# Patient Record
Sex: Female | Born: 1965
Health system: Southern US, Community
[De-identification: ages and names within clinical notes are randomized; demographics above are authoritative.]

## PROBLEM LIST (undated history)

## (undated) DIAGNOSIS — T7840XA Allergy, unspecified, initial encounter: Secondary | ICD-10-CM

## (undated) DIAGNOSIS — K219 Gastro-esophageal reflux disease without esophagitis: Secondary | ICD-10-CM

## (undated) DIAGNOSIS — F419 Anxiety disorder, unspecified: Secondary | ICD-10-CM

## (undated) DIAGNOSIS — G43909 Migraine, unspecified, not intractable, without status migrainosus: Secondary | ICD-10-CM

## (undated) DIAGNOSIS — E785 Hyperlipidemia, unspecified: Secondary | ICD-10-CM

## (undated) HISTORY — DX: Allergy, unspecified, initial encounter: T78.40XA

## (undated) HISTORY — PX: CHOLECYSTECTOMY: SHX55

## (undated) HISTORY — DX: Hyperlipidemia, unspecified: E78.5

---

## 2006-08-01 LAB — CONVERTED CEMR LAB
Pap Smear: NORMAL
Pap Smear: NORMAL

## 2008-10-30 ENCOUNTER — Ambulatory Visit: Payer: Self-pay | Admitting: Family Medicine

## 2008-10-30 DIAGNOSIS — L408 Other psoriasis: Secondary | ICD-10-CM | POA: Insufficient documentation

## 2008-10-30 DIAGNOSIS — J309 Allergic rhinitis, unspecified: Secondary | ICD-10-CM | POA: Insufficient documentation

## 2008-10-30 DIAGNOSIS — R87619 Unspecified abnormal cytological findings in specimens from cervix uteri: Secondary | ICD-10-CM | POA: Insufficient documentation

## 2008-10-30 DIAGNOSIS — N3946 Mixed incontinence: Secondary | ICD-10-CM | POA: Insufficient documentation

## 2008-10-30 LAB — CONVERTED CEMR LAB
Bilirubin Urine: NEGATIVE
Glucose, Urine, Semiquant: NEGATIVE
Ketones, urine, test strip: NEGATIVE
Specific Gravity, Urine: 1.01
Urobilinogen, UA: 0.2
pH: 6.5

## 2009-01-22 ENCOUNTER — Ambulatory Visit: Payer: Self-pay | Admitting: Family Medicine

## 2009-01-22 LAB — CONVERTED CEMR LAB: LDL Cholesterol: 149 mg/dL

## 2009-01-27 LAB — CONVERTED CEMR LAB
ALT: 19 units/L (ref 0–35)
AST: 18 units/L (ref 0–37)
Alkaline Phosphatase: 60 units/L (ref 39–117)
Bilirubin, Direct: 0.1 mg/dL (ref 0.0–0.3)
CO2: 30 meq/L (ref 19–32)
Chloride: 106 meq/L (ref 96–112)
Creatinine, Ser: 0.8 mg/dL (ref 0.4–1.2)
Potassium: 4.2 meq/L (ref 3.5–5.1)
Sodium: 141 meq/L (ref 135–145)
Total Bilirubin: 0.9 mg/dL (ref 0.3–1.2)
Total CHOL/HDL Ratio: 6
Total Protein: 7.2 g/dL (ref 6.0–8.3)
VLDL: 25.6 mg/dL (ref 0.0–40.0)

## 2009-02-05 ENCOUNTER — Other Ambulatory Visit: Admission: RE | Admit: 2009-02-05 | Discharge: 2009-02-05 | Payer: Self-pay | Admitting: Family Medicine

## 2009-02-05 ENCOUNTER — Encounter: Payer: Self-pay | Admitting: Family Medicine

## 2009-02-05 ENCOUNTER — Ambulatory Visit: Payer: Self-pay | Admitting: Family Medicine

## 2009-02-05 DIAGNOSIS — R928 Other abnormal and inconclusive findings on diagnostic imaging of breast: Secondary | ICD-10-CM | POA: Insufficient documentation

## 2009-02-05 DIAGNOSIS — E7800 Pure hypercholesterolemia, unspecified: Secondary | ICD-10-CM | POA: Insufficient documentation

## 2009-02-08 LAB — CONVERTED CEMR LAB
Basophils Relative: 1 % (ref 0–1)
Eosinophils Relative: 2 % (ref 0–5)
HCT: 41.4 % (ref 36.0–46.0)
Hemoglobin: 13.8 g/dL (ref 12.0–15.0)
MCHC: 33.3 g/dL (ref 30.0–36.0)
Monocytes Absolute: 0.4 10*3/uL (ref 0.1–1.0)
Monocytes Relative: 6 % (ref 3–12)
Neutro Abs: 4.6 10*3/uL (ref 1.7–7.7)
RBC: 4.85 M/uL (ref 3.87–5.11)
RDW: 13.2 % (ref 11.5–15.5)
TSH: 1.453 microintl units/mL (ref 0.350–4.500)

## 2009-02-10 ENCOUNTER — Encounter (INDEPENDENT_AMBULATORY_CARE_PROVIDER_SITE_OTHER): Payer: Self-pay | Admitting: *Deleted

## 2009-02-16 ENCOUNTER — Encounter: Payer: Self-pay | Admitting: Family Medicine

## 2009-02-22 ENCOUNTER — Encounter: Payer: Self-pay | Admitting: Family Medicine

## 2009-02-23 ENCOUNTER — Encounter: Payer: Self-pay | Admitting: Family Medicine

## 2009-03-02 ENCOUNTER — Encounter (INDEPENDENT_AMBULATORY_CARE_PROVIDER_SITE_OTHER): Payer: Self-pay | Admitting: *Deleted

## 2009-03-03 ENCOUNTER — Encounter (INDEPENDENT_AMBULATORY_CARE_PROVIDER_SITE_OTHER): Payer: Self-pay | Admitting: *Deleted

## 2009-05-12 ENCOUNTER — Ambulatory Visit: Payer: Self-pay | Admitting: Family Medicine

## 2009-05-14 LAB — CONVERTED CEMR LAB
Cholesterol: 238 mg/dL — ABNORMAL HIGH (ref 0–200)
Direct LDL: 185.4 mg/dL
HDL: 45.5 mg/dL (ref >39.00)
LDL Cholesterol: 180 mg/dL — ABNORMAL HIGH (ref 0–99)
VLDL: 12.6 mg/dL (ref 0.0–40.0)

## 2009-08-06 ENCOUNTER — Encounter: Payer: Self-pay | Admitting: Family Medicine

## 2010-04-29 ENCOUNTER — Encounter: Payer: Self-pay | Admitting: Family Medicine

## 2010-05-04 LAB — HM MAMMOGRAPHY: HM Mammogram: NORMAL

## 2010-06-07 ENCOUNTER — Telehealth (INDEPENDENT_AMBULATORY_CARE_PROVIDER_SITE_OTHER): Payer: Self-pay | Admitting: *Deleted

## 2010-06-09 ENCOUNTER — Ambulatory Visit: Payer: Self-pay | Admitting: Family Medicine

## 2010-06-09 LAB — CONVERTED CEMR LAB: LDL Cholesterol: 1742 mg/dL

## 2010-06-14 LAB — CONVERTED CEMR LAB
AST: 17 units/L (ref 0–37)
Albumin: 3.8 g/dL (ref 3.5–5.2)
Alkaline Phosphatase: 60 units/L (ref 39–117)
Chloride: 103 meq/L (ref 96–112)
GFR calc non Af Amer: 71.85 mL/min (ref >60.00)
Glucose, Bld: 92 mg/dL (ref 70–99)
Potassium: 4.2 meq/L (ref 3.5–5.1)
Sodium: 138 meq/L (ref 135–145)
Total CHOL/HDL Ratio: 5
VLDL: 18.2 mg/dL (ref 0.0–40.0)

## 2010-07-01 ENCOUNTER — Other Ambulatory Visit: Payer: Self-pay | Admitting: Family Medicine

## 2010-07-01 ENCOUNTER — Ambulatory Visit
Admission: RE | Admit: 2010-07-01 | Discharge: 2010-07-01 | Payer: Self-pay | Source: Home / Self Care | Attending: Family Medicine | Admitting: Family Medicine

## 2010-07-01 ENCOUNTER — Encounter: Payer: Self-pay | Admitting: Family Medicine

## 2010-07-01 ENCOUNTER — Other Ambulatory Visit
Admission: RE | Admit: 2010-07-01 | Discharge: 2010-07-01 | Payer: Self-pay | Source: Home / Self Care | Admitting: Family Medicine

## 2010-07-01 DIAGNOSIS — L919 Hypertrophic disorder of the skin, unspecified: Secondary | ICD-10-CM

## 2010-07-01 DIAGNOSIS — L909 Atrophic disorder of skin, unspecified: Secondary | ICD-10-CM | POA: Insufficient documentation

## 2010-07-01 LAB — CONVERTED CEMR LAB
Cholesterol, target level: 200 mg/dL
LDL Goal: 160 mg/dL

## 2010-07-01 LAB — HM PAP SMEAR

## 2010-07-07 ENCOUNTER — Encounter (INDEPENDENT_AMBULATORY_CARE_PROVIDER_SITE_OTHER): Payer: Self-pay | Admitting: *Deleted

## 2010-07-14 NOTE — Progress Notes (Signed)
----   Converted from flag ---- ---- 06/07/2010 8:24 AM, Kerby Nora MD wrote: CMET, lipids Dx 272.0  ---- 06/02/2010 11:30 AM, Liane Comber CMA (AAMA) wrote: Lab orders please! Good Morning! This pt is scheduled for cpx labs [place], which labs to draw and dx codes to use? Thanks Tasha ------------------------------

## 2010-07-14 NOTE — Letter (Signed)
Summary: Physician Order for [facility]  Physician Order for [facility]   Imported By: Maryln Gottron 07/06/2010 12:57:02  _____________________________________________________________________  External Attachment:    Type:   Image     Comment:   External Document

## 2010-07-14 NOTE — Assessment & Plan Note (Signed)
Summary: CPX/CLE   Vital Signs:  Patient profile:   45 year old female Height:      67 inches Weight:      152.25 pounds BMI:     23.93 Temp:     97.9 degrees F oral Pulse rate:   84 / minute Pulse rhythm:   regular BP sitting:   110 / 80  (left arm) Cuff size:   regular  Vitals Entered By: Benny Lennert CMA Duncan Dull) (July 01, 2010 11:16 AM)  History of Present Illness: Chief complaint cpx  The patient is here for annual wellness exam and preventative care.    Labs reviewed in detail with pt. Cholesterol above goal.       Lipid Management History:      Negative NCEP/ATP III risk factors include female age less than 96 years old and non-tobacco-user status.        Her compliance with the TLC diet is fair.  The patient expresses understanding of adjunctive measures for cholesterol lowering.  Adjunctive measures started by the patient include aerobic exercise, fiber, omega-3 supplements, limit alcohol consumpton, and weight reduction.  Comments: No improvement from last check in 2010..not on a medication. .   Preventive Screening-Counseling & Management  Alcohol-Tobacco     Alcohol drinks/day: 0     Smoking Status: never  Caffeine-Diet-Exercise     Diet Comments: fruits and veggies,limiting fried foods.     Diet Counseling: not indicated; diet is assessed to be healthy     Does Patient Exercise: yes     Type of exercise: walking      Times/week: 2     Exercise Counseling: to improve exercise regimen  Problems Prior to Update: 1)  Routine Gynecological Examination  (ICD-V72.31) 2)  Physical Examination  (ICD-V70.0) 3)  Abdominal Bloating  (ICD-787.3) 4)  Hematochezia  (ICD-578.1) 5)  Hypercholesterolemia  (ICD-272.0) 6)  Mammogram, Abnormal, Left  (ICD-793.80) 7)  Screening For Lipoid Disorders  (ICD-V77.91) 8)  Psoriasis  (ICD-696.1) 9)  Hx of Pap Smear, Abnormal  (ICD-795.00) 10)  Family History Breast Cancer 1st Degree Relative <50  (ICD-V16.3) 11)  Urinary  Incontinence, Mixed  (ICD-788.33) 12)  Allergic Rhinitis  (ICD-477.9)  Current Medications (verified): 1)  Calcium 600/vitamin D 600-400 Mg-Unit Tabs (Calcium Carbonate-Vitamin D) .Marland Kitchen.. 1 Tab By Mouth Two Times A Day  Allergies (verified): No Known Drug Allergies  Social History: Does Patient Exercise:  yes Smoking Status:  never  Review of Systems General:  Denies fatigue. CV:  Denies chest pain or discomfort. Resp:  Denies shortness of breath. GI:  Denies abdominal pain and bloody stools. GU:  Denies abnormal vaginal bleeding, discharge, and dysuria. Derm:  Complains of lesion(s); skin tag under left arm.. tender with shaving and has turned darker. Marland Kitchen Psych:  Denies anxiety.  Physical Exam  General:  Well-developed,well-nourished,in no acute distress; alert,appropriate and cooperative throughout examination Eyes:  No corneal or conjunctival inflammation noted. EOMI. Perrla. Funduscopic exam benign, without hemorrhages, exudates or papilledema. Vision grossly normal. Ears:  External ear exam shows no significant lesions or deformities.  Otoscopic examination reveals clear canals, tympanic membranes are intact bilaterally without bulging, retraction, inflammation or discharge. Hearing is grossly normal bilaterally. Nose:  External nasal examination shows no deformity or inflammation. Nasal mucosa are pink and moist without lesions or exudates. Mouth:  Oral mucosa and oropharynx without lesions or exudates.  Teeth in good repair. Neck:  no carotid bruit or thyromegaly no cervical or supraclavicular lymphadenopathy  Chest Wall:  No deformities, masses, or tenderness noted. Breasts:  No mass, nodules, thickening, tenderness, bulging, retraction, inflamation, nipple discharge or skin changes noted.   Lungs:  Normal respiratory effort, chest expands symmetrically. Lungs are clear to auscultation, no crackles or wheezes. Heart:  Normal rate and regular rhythm. S1 and S2 normal without  gallop, murmur, click, rub or other extra sounds. Abdomen:  Bowel sounds positive,abdomen soft and non-tender without masses, organomegaly or hernias noted. Genitalia:  Pelvic Exam:        External: normal female genitalia without lesions or masses        Vagina: normal without lesions or masses        Cervix: normal without lesions or masses        Adnexa: normal bimanual exam without masses or fullness        Uterus: normal by palpation        Pap smear: performed Pulses:  R and L posterior tibial pulses are full and equal bilaterally  Extremities:  no edema Skin:  necrotic skin tag Psych:  Cognition and judgment appear intact. Alert and cooperative with normal attention span and concentration. No apparent delusions, illusions, hallucinations   Impression & Recommendations:  Problem # 1:  PHYSICAL EXAMINATION (ICD-V70.0) The patient's preventative maintenance and recommended screening tests for an annual wellness exam were reviewed in full today. Brought up to date unless services declined.  Counselled on the importance of diet, exercise, and its role in overall health and mortality. The patient's FH and SH was reviewed, including their home life, tobacco status, and drug and alcohol status.     Problem # 2:  Gynecological examination-routine (ICD-V72.31) PAP pending.. if normal.. space q 3 years.   Problem # 3:  SKIN TAG (ICD-701.9) Torsed and necrotic, tender.  Feel off on exam. Minimal bleeding.  Complete Medication List: 1)  Calcium 600/vitamin D 600-400 Mg-unit Tabs (Calcium carbonate-vitamin d) .Marland Kitchen.. 1 tab by mouth two times a day  Other Orders: Nutrition Referral (Nutrition)  Lipid Assessment/Plan:      Based on NCEP/ATP III, the patient's risk factor category is "0-1 risk factors".  The patient's lipid goals are as follows: Total cholesterol goal is 200; LDL cholesterol goal is 160; HDL cholesterol goal is 40; Triglyceride goal is 150.  Her LDL cholesterol goal has  not been met.    Patient Instructions: 1)  Referral Appointment Information 2)  Day/Date: 3)  Time: 4)  Place/MD: 5)  Address: 6)  Phone/Fax: 7)  Patient given appointment information. Information/Orders faxed/mailed.  8)   Fish oil 2000 mg divided daily.Marland Kitchenorange or lemon flavored (or Flax seed oil).  Make sure DHA and EPA = 2000 mg. 9)  Increase exercise. 10)  Consider red yeast rice.. 2400 mg daily. 11)  Recheck fasting LIPIDS in 3 months Dx 272.0    12)      Orders Added: 1)  Nutrition Referral [Nutrition] 2)  Est. Patient 40-64 years [99396]    Current Allergies (reviewed today): No known allergies   Flu Vaccine Result Date:  03/12/2010 Flu Vaccine Result:  given Flu Vaccine Next Due:  1 yr TD Result Date:  08/10/2009 TD Result:  given TD Next Due:  10 yr Last LDL:  180 (05/12/2009 8:59:43 AM) LDL Result Date:  06/09/2010 LDL Result:  1742 LDL Next Due:  12 wk

## 2010-07-14 NOTE — Letter (Signed)
Summary: Results Follow up Letter  [place] [facility]  [address] [place], [place] [address]   Phone: [phone]  Fax: [phone]    07/07/2010 MRN: [id number]     Joyce Brock [address] [place], [place]  [address]  Dear [name],  The following are the results of your recent test(s):    Test         Result    Pap Smear:        Normal __x___  Not Normal _____ Comments:Repeat in 1 year ______________________________________________________ Cholesterol: LDL(Bad cholesterol):         Your goal is less than:         HDL (Good cholesterol):       Your goal is more than: Comments:  ______________________________________________________ Mammogram:        Normal _____  Not Normal _____ Comments:  ___________________________________________________________________ Hemoccult:        Normal _____  Not normal _______ Comments:    _____________________________________________________________________ Other Tests:    We routinely do not discuss normal results over the telephone.  If you desire a copy of the results, or you have any questions about this information we can discuss them at your next office visit.   Sincerely,  Kerby Nora MD

## 2010-07-28 ENCOUNTER — Encounter: Payer: Commercial Managed Care - PPO | Attending: Family Medicine | Admitting: *Deleted

## 2010-07-28 DIAGNOSIS — E7800 Pure hypercholesterolemia, unspecified: Secondary | ICD-10-CM | POA: Insufficient documentation

## 2010-07-28 DIAGNOSIS — Z713 Dietary counseling and surveillance: Secondary | ICD-10-CM | POA: Insufficient documentation

## 2010-09-08 ENCOUNTER — Encounter: Payer: Commercial Managed Care - PPO | Attending: Family Medicine | Admitting: *Deleted

## 2010-09-08 DIAGNOSIS — E7800 Pure hypercholesterolemia, unspecified: Secondary | ICD-10-CM | POA: Insufficient documentation

## 2010-09-08 DIAGNOSIS — Z713 Dietary counseling and surveillance: Secondary | ICD-10-CM | POA: Insufficient documentation

## 2010-09-26 ENCOUNTER — Other Ambulatory Visit: Payer: Self-pay | Admitting: *Deleted

## 2010-09-26 DIAGNOSIS — E7800 Pure hypercholesterolemia, unspecified: Secondary | ICD-10-CM

## 2010-09-29 ENCOUNTER — Other Ambulatory Visit (INDEPENDENT_AMBULATORY_CARE_PROVIDER_SITE_OTHER): Payer: Commercial Managed Care - PPO | Admitting: Family Medicine

## 2010-09-29 DIAGNOSIS — E7800 Pure hypercholesterolemia, unspecified: Secondary | ICD-10-CM

## 2010-09-29 DIAGNOSIS — E785 Hyperlipidemia, unspecified: Secondary | ICD-10-CM

## 2011-05-11 ENCOUNTER — Encounter: Payer: Self-pay | Admitting: Family Medicine

## 2011-09-27 ENCOUNTER — Encounter: Payer: Self-pay | Admitting: Family Medicine

## 2011-09-28 ENCOUNTER — Ambulatory Visit (INDEPENDENT_AMBULATORY_CARE_PROVIDER_SITE_OTHER): Payer: Commercial Managed Care - PPO | Admitting: Family Medicine

## 2011-09-28 ENCOUNTER — Encounter: Payer: Self-pay | Admitting: Family Medicine

## 2011-09-28 VITALS — BP 120/70 | HR 94 | Temp 98.9°F | Ht 66.0 in | Wt 156.1 lb

## 2011-09-28 DIAGNOSIS — C4491 Basal cell carcinoma of skin, unspecified: Secondary | ICD-10-CM

## 2011-09-28 NOTE — Progress Notes (Signed)
  Patient Name: Joyce Brock Date of Birth: 1965/07/20 Age: 46 y.o. Medical Record Number: [id number] Gender: female Date of Encounter: 09/28/2011  History of Present Illness:  Joyce Brock is a 46 y.o. very pleasant female patient who presents with the following:  Pleasant pt with place on chest that started looking like a small ulcer, and now has been growing over the last few months. It has been growing some over time, and now has a raised pearly appearance.   Past Medical History, Surgical History, Social History, Family History, Problem List, Medications, and Allergies have been reviewed and updated if relevant.  Review of Systems:  GEN: No acute illnesses, no fevers, chills. GI: No n/v/d, eating normally Pulm: No SOB Interactive and getting along well at home.  Otherwise, ROS is as per the HPI.   Physical Examination: Filed Vitals:   09/28/11 1208  BP: 120/70  Pulse: 94  Temp: 98.9 F (37.2 C)  TempSrc: Oral  Height: 5\' 6"  (1.676 m)  Weight: 156 lb 1.9 oz (70.816 kg)  SpO2: 98%    GEN: WDWN, NAD, Non-toxic, Alert & Oriented x 3 HEENT: Atraumatic, Normocephalic.  Ears and Nose: No external deformity. EXTR: No clubbing/cyanosis/edema SKIN: small elevated pearly lesion on the patient's anterior chest. She also has an adjacent lesion that is minimally elevated but also pearly in appearance. She also has multiple other areas that have the appearance of skin damage, some small scale NEURO: Normal gait.  PSYCH: Normally interactive. Conversant. Not depressed or anxious appearing.  Calm demeanor.    Assessment and Plan: 1. Basal cell cancer  Ambulatory referral to Dermatology    Today the lesion on her chest looks most like a basal cell cancer, cannot rule out squamous cell cancer. She also has multiple other areas that may be potentially precancerous  I think the most reasonable course of Bactrim years to have her see her dermatologist to have the one area  biopsied and to check the other areas of question

## 2011-09-28 NOTE — Patient Instructions (Signed)
REFERRAL: GO THE THE FRONT ROOM AT THE ENTRANCE OF OUR CLINIC, NEAR CHECK IN. ASK FOR MARION. SHE WILL HELP YOU SET UP YOUR REFERRAL. DATE: TIME:  

## 2011-12-20 ENCOUNTER — Telehealth: Payer: Self-pay | Admitting: Family Medicine

## 2011-12-20 DIAGNOSIS — E7800 Pure hypercholesterolemia, unspecified: Secondary | ICD-10-CM

## 2011-12-20 NOTE — Telephone Encounter (Signed)
Message copied by Excell Seltzer on Wed Dec 20, 2011 11:24 PM ------      Message from: Alvina Chou      Created: Tue Dec 19, 2011 12:43 PM      Regarding: Labs for Thursday, 7.11.13       Patient is scheduled for CPX labs, please order future labs, Thanks , Camelia Eng

## 2011-12-21 ENCOUNTER — Other Ambulatory Visit (INDEPENDENT_AMBULATORY_CARE_PROVIDER_SITE_OTHER): Payer: Commercial Managed Care - PPO

## 2011-12-21 DIAGNOSIS — E7800 Pure hypercholesterolemia, unspecified: Secondary | ICD-10-CM

## 2011-12-21 LAB — LIPID PANEL
Total CHOL/HDL Ratio: 5
Triglycerides: 141 mg/dL (ref 0.0–149.0)

## 2011-12-21 LAB — COMPREHENSIVE METABOLIC PANEL
AST: 16 U/L (ref 0–37)
Albumin: 4.1 g/dL (ref 3.5–5.2)
Alkaline Phosphatase: 60 U/L (ref 39–117)
BUN: 10 mg/dL (ref 6–23)
Calcium: 9.1 mg/dL (ref 8.4–10.5)
Chloride: 104 mEq/L (ref 96–112)
Glucose, Bld: 95 mg/dL (ref 70–99)
Potassium: 4 mEq/L (ref 3.5–5.1)
Sodium: 137 mEq/L (ref 135–145)
Total Protein: 7.4 g/dL (ref 6.0–8.3)

## 2011-12-21 LAB — LDL CHOLESTEROL, DIRECT: Direct LDL: 163.5 mg/dL

## 2011-12-26 ENCOUNTER — Ambulatory Visit (INDEPENDENT_AMBULATORY_CARE_PROVIDER_SITE_OTHER): Payer: Commercial Managed Care - PPO | Admitting: Family Medicine

## 2011-12-26 ENCOUNTER — Encounter: Payer: Self-pay | Admitting: Family Medicine

## 2011-12-26 VITALS — BP 112/80 | HR 80 | Temp 97.8°F | Ht 66.5 in | Wt 159.0 lb

## 2011-12-26 DIAGNOSIS — R0789 Other chest pain: Secondary | ICD-10-CM

## 2011-12-26 DIAGNOSIS — R002 Palpitations: Secondary | ICD-10-CM | POA: Insufficient documentation

## 2011-12-26 DIAGNOSIS — E7800 Pure hypercholesterolemia, unspecified: Secondary | ICD-10-CM

## 2011-12-26 NOTE — Patient Instructions (Addendum)
Red yeast rice.. 2400 mg divided daily. Fish oil/flax seed oil. 2000 mg divided daily.  Increase exercise and work on low cholesterol diet. Call if fatigue persisting or worsening. Decrease caffeine, increase water. If chest heaviness recurs.. Call for stress test. If palpitations increasing in frequency... We will send you to cardiology for evaluation with hear tmonitor. Return for follow up cholesterol and palpitations in 3 months with fasting labs prior. Call [facility] to scheduled yearly mammogram.

## 2011-12-26 NOTE — Assessment & Plan Note (Signed)
EKG shows no arrythmia.  Increase water and decrease caffeine.  If continuing/ increasing in frequency... Consider referral to cardiology for holter monitor. Will check TSH and CBC to evaluate further.

## 2011-12-26 NOTE — Assessment & Plan Note (Signed)
Pt moderate risk level with high cholesterol, age >73, no family history , nonsmoker, no DM, no CVA, no past MI, no PVD. EKG shows nonspecific T wave depression.  If chest pain continues... Consider stress test.

## 2011-12-26 NOTE — Assessment & Plan Note (Signed)
Poor control. Start red yeast rice. Continue lifestyle changes. Recheck in 3 months.

## 2011-12-26 NOTE — Progress Notes (Signed)
Subjective:    Patient ID: Joyce Brock, female    DOB: March 02, 1966, 46 y.o.   MRN: [id number]  HPI The patient is here for annual wellness exam and preventative care.    Elevated Cholesterol: Above goal LDL <130. Lab Results  Component Value Date   CHOL 248* 12/21/2011   HDL 45.80 12/21/2011   LDLCALC 174 06/09/2010   LDLCALC 1742 06/09/2010   LDLCALC 1742 06/09/2010   LDLDIRECT 163.5 12/21/2011   TRIG 141.0 12/21/2011   CHOLHDL 5 12/21/2011  On no medication. No first degree relative with I. Diet compliance: Moderate.. Has gone to nutritionist. Exercise: Occasional. Other complaints:     Review of Systems  Constitutional: Positive for fatigue. Negative for fever and unexpected weight change.       Hot flashes  HENT: Negative for ear pain, congestion, sore throat, sneezing, trouble swallowing and sinus pressure.   Eyes: Negative for pain and itching.  Respiratory: Negative for cough, shortness of breath and wheezing.   Cardiovascular: Positive for palpitations. Negative for chest pain and leg swelling.       Several episodes of heart fluttering  in last few moths, occ dull ache at rest... Seems to occur when more stress.  Gastrointestinal: Negative for nausea, abdominal pain, diarrhea, constipation and blood in stool.  Genitourinary: Negative for dysuria, hematuria, vaginal discharge, difficulty urinating and menstrual problem.       Monthly menses but light to heavy  Skin: Negative for rash.  Neurological: Negative for syncope, weakness, light-headedness, numbness and headaches.  Psychiatric/Behavioral: Negative for confusion and dysphoric mood. The patient is not nervous/anxious.        Objective:   Physical Exam  Constitutional: Vital signs are normal. She appears well-developed and well-nourished. She is cooperative.  Non-toxic appearance. She does not appear ill. No distress.  HENT:  Head: Normocephalic.  Right Ear: Hearing, tympanic membrane, external ear and ear  canal normal.  Left Ear: Hearing, tympanic membrane, external ear and ear canal normal.  Nose: Nose normal.  Eyes: Conjunctivae, EOM and lids are normal. Pupils are equal, round, and reactive to light. No foreign bodies found.  Neck: Trachea normal and normal range of motion. Neck supple. Carotid bruit is not present. No mass and no thyromegaly present.  Cardiovascular: Normal rate, regular rhythm, S1 normal, S2 normal, normal heart sounds and intact distal pulses.  Exam reveals no gallop.   No murmur heard. Pulmonary/Chest: Effort normal and breath sounds normal. No respiratory distress. She has no wheezes. She has no rhonchi. She has no rales.  Abdominal: Soft. Normal appearance and bowel sounds are normal. She exhibits no distension, no fluid wave, no abdominal bruit and no mass. There is no hepatosplenomegaly. There is no tenderness. There is no rebound, no guarding and no CVA tenderness. No hernia.  Genitourinary: Vagina normal and uterus normal. No breast swelling, tenderness, discharge or bleeding. Pelvic exam was performed with patient prone. There is no rash, tenderness or lesion on the right labia. There is no rash, tenderness or lesion on the left labia. Uterus is not enlarged and not tender. Right adnexum displays no mass, no tenderness and no fullness. Left adnexum displays no mass, no tenderness and no fullness.       No pap.  Lymphadenopathy:    She has no cervical adenopathy.    She has no axillary adenopathy.  Neurological: She is alert. She has normal strength. No cranial nerve deficit or sensory deficit.  Skin: Skin is warm, dry and intact. No  rash noted.  Psychiatric: Her speech is normal and behavior is normal. Judgment normal. Her mood appears not anxious. Cognition and memory are normal. She does not exhibit a depressed mood.          Assessment & Plan:  The patient's preventative maintenance and recommended screening tests for an annual wellness exam were reviewed in  full today. Brought up to date unless services declined.  Counselled on the importance of diet, exercise, and its role in overall health and mortality. The patient's FH and SH was reviewed, including their home life, tobacco status, and drug and alcohol status.   PAP every 3 years .Marland Kitchen Last nml in 06/2010, DVE yearly  Mammo Due, pt will schedule.  Vaccines:Up to date with Td.  Nonsmoker.

## 2012-03-22 ENCOUNTER — Other Ambulatory Visit (INDEPENDENT_AMBULATORY_CARE_PROVIDER_SITE_OTHER): Payer: Commercial Managed Care - PPO

## 2012-03-22 DIAGNOSIS — R002 Palpitations: Secondary | ICD-10-CM

## 2012-03-22 DIAGNOSIS — E7800 Pure hypercholesterolemia, unspecified: Secondary | ICD-10-CM

## 2012-03-22 LAB — CBC WITH DIFFERENTIAL/PLATELET
Basophils Absolute: 0.1 10*3/uL (ref 0.0–0.1)
Eosinophils Absolute: 0.2 10*3/uL (ref 0.0–0.7)
Lymphocytes Relative: 30.9 % (ref 12.0–46.0)
MCHC: 33 g/dL (ref 30.0–36.0)
MCV: 88.9 fl (ref 78.0–100.0)
Monocytes Absolute: 0.3 10*3/uL (ref 0.1–1.0)
Neutrophils Relative %: 58.8 % (ref 43.0–77.0)
Platelets: 221 10*3/uL (ref 150.0–400.0)
RDW: 13.5 % (ref 11.5–14.6)

## 2012-03-22 LAB — TSH: TSH: 2.51 u[IU]/mL (ref 0.35–5.50)

## 2012-03-22 LAB — LIPID PANEL
HDL: 40.1 mg/dL (ref >39.00)
Triglycerides: 132 mg/dL (ref 0.0–149.0)
VLDL: 26.4 mg/dL (ref 0.0–40.0)

## 2012-03-22 LAB — LDL CHOLESTEROL, DIRECT: Direct LDL: 168.7 mg/dL

## 2012-03-29 ENCOUNTER — Encounter: Payer: Self-pay | Admitting: Family Medicine

## 2012-03-29 ENCOUNTER — Ambulatory Visit (INDEPENDENT_AMBULATORY_CARE_PROVIDER_SITE_OTHER): Payer: Commercial Managed Care - PPO | Admitting: Family Medicine

## 2012-03-29 VITALS — BP 120/80 | HR 84 | Temp 98.5°F | Wt 162.0 lb

## 2012-03-29 DIAGNOSIS — E7800 Pure hypercholesterolemia, unspecified: Secondary | ICD-10-CM

## 2012-03-29 DIAGNOSIS — R0789 Other chest pain: Secondary | ICD-10-CM

## 2012-03-29 DIAGNOSIS — R002 Palpitations: Secondary | ICD-10-CM

## 2012-03-29 DIAGNOSIS — K6289 Other specified diseases of anus and rectum: Secondary | ICD-10-CM

## 2012-03-29 MED ORDER — ATORVASTATIN CALCIUM 10 MG PO TABS: 10.0000 mg | ORAL_TABLET | Freq: Every day | ORAL | Status: DC

## 2012-03-29 MED ORDER — HYDROCORTISONE ACE-PRAMOXINE 2.5-1 % RE CREA: TOPICAL_CREAM | Freq: Three times a day (TID) | RECTAL | Status: DC

## 2012-03-29 NOTE — Assessment & Plan Note (Signed)
Continue to work on diet etc. Start lipitor 10 mg daily. recehck in 3 months.

## 2012-03-29 NOTE — Progress Notes (Signed)
Subjective:    Patient ID: Joyce Brock, female    DOB: 1966/01/02, 46 y.o.   MRN: [id number]  HPI 46 year old female presents for follow up.   Elevated Cholesterol:  Not  At goal LDL <130 on red yeast rice now for 3 months... LDL minimally improved.  She reports she was not able to take it twice a day because she would forget. Lab Results  Component Value Date   CHOL 229* 03/22/2012   HDL 40.10 03/22/2012   LDLCALC 174 06/09/2010   LDLCALC 1742 06/09/2010   LDLCALC 1742 06/09/2010   LDLDIRECT 168.7 03/22/2012   TRIG 132.0 03/22/2012   CHOLHDL 6 03/22/2012  Using medications without problems: None Muscle aches: None Diet compliance:Moderate Exercise: Not regularly. Other complaints:  Palpitations, atypical chest pain: Discussed at last OV 12/2011. Pt moderate risk level with high cholesterol, age >45, no family history , nonsmoker, no DM, no CVA, no past MI, no PVD.  EKG shows nonspecific T wave depression.  No anemia and nml thyroid on 03/2012 labs. She reports that she is no longer having any chest pain, no further palpitations.  She has decreased caffeine dramatically.  Has been having some issues with hemorrhoids over last month and a half following a cold. Has had this in past. Was very painful to sit. Used sitz baths.  Used prep H suppositories for 1 week OTC. Improved a lot but still bothering her some. Still with some rectal pain with BM. No rectal bleeding. She is still having rectal itching but along the sides of her butt checks. Noted black spot down there at edge of rectal opening. Also used monistat for yeast infection, that has now cleared up. Nml BMs, no constipation.  Review of Systems  Constitutional: Negative for fever and fatigue.  HENT: Negative for ear pain.   Eyes: Negative for pain.  Respiratory: Negative for chest tightness and shortness of breath.   Cardiovascular: Negative for chest pain, palpitations and leg swelling.  Gastrointestinal:  Negative for abdominal pain.  Genitourinary: Negative for dysuria.       Objective:   Physical Exam  Constitutional: Vital signs are normal. She appears well-developed and well-nourished. She is cooperative.  Non-toxic appearance. She does not appear ill. No distress.  HENT:  Head: Normocephalic.  Right Ear: Hearing, tympanic membrane, external ear and ear canal normal. Tympanic membrane is not erythematous, not retracted and not bulging.  Left Ear: Hearing, tympanic membrane, external ear and ear canal normal. Tympanic membrane is not erythematous, not retracted and not bulging.  Nose: No mucosal edema or rhinorrhea. Right sinus exhibits no maxillary sinus tenderness and no frontal sinus tenderness. Left sinus exhibits no maxillary sinus tenderness and no frontal sinus tenderness.  Mouth/Throat: Uvula is midline, oropharynx is clear and moist and mucous membranes are normal.  Eyes: Conjunctivae normal, EOM and lids are normal. Pupils are equal, round, and reactive to light. No foreign bodies found.  Neck: Trachea normal and normal range of motion. Neck supple. Carotid bruit is not present. No mass and no thyromegaly present.  Cardiovascular: Normal rate, regular rhythm, S1 normal, S2 normal, normal heart sounds, intact distal pulses and normal pulses.  Exam reveals no gallop and no friction rub.   No murmur heard. Pulmonary/Chest: Effort normal and breath sounds normal. Not tachypneic. No respiratory distress. She has no decreased breath sounds. She has no wheezes. She has no rhonchi. She has no rales.  Abdominal: Soft. Normal appearance and bowel sounds are normal. There is  no tenderness.  Genitourinary: Rectal exam shows external hemorrhoid and tenderness. Rectal exam shows no internal hemorrhoid, no fissure and no mass.  Neurological: She is alert.  Skin: Skin is warm, dry and intact. No rash noted.  Psychiatric: Her speech is normal and behavior is normal. Judgment and thought content  normal. Her mood appears not anxious. Cognition and memory are normal. She does not exhibit a depressed mood.          Assessment & Plan:

## 2012-03-29 NOTE — Assessment & Plan Note (Addendum)
Resolved

## 2012-03-29 NOTE — Assessment & Plan Note (Signed)
Resolved off caffeine.

## 2012-03-29 NOTE — Assessment & Plan Note (Signed)
Due to external hemmorhoids. No thrombosis. Black spot was hemmorhoid.  treat with topical steroid cream.

## 2012-03-29 NOTE — Patient Instructions (Addendum)
Start atorvastatin 10 mg daily. Stop red yeast rice and fish oil.. Work on low cholesterol diet and exercise 3-5 times a week. Return for cholesterol labs in 3 months. Topical cream for rectal issue.

## 2012-11-15 ENCOUNTER — Ambulatory Visit (INDEPENDENT_AMBULATORY_CARE_PROVIDER_SITE_OTHER): Payer: 59 | Admitting: Family Medicine

## 2012-11-15 ENCOUNTER — Encounter: Payer: Self-pay | Admitting: Family Medicine

## 2012-11-15 VITALS — BP 110/70 | HR 86 | Temp 98.5°F | Ht 66.5 in | Wt 160.5 lb

## 2012-11-15 DIAGNOSIS — B079 Viral wart, unspecified: Secondary | ICD-10-CM | POA: Insufficient documentation

## 2012-11-15 DIAGNOSIS — B078 Other viral warts: Secondary | ICD-10-CM | POA: Insufficient documentation

## 2012-11-15 NOTE — Patient Instructions (Addendum)
Start compound W application and duct tape 1-2 times daily. Follow up if not improving in next 6-8 weeks.

## 2012-11-15 NOTE — Assessment & Plan Note (Signed)
Treat with topical salicylic acid, cover with duct tape. Can take 8-12 weeks to resolve,  But if no better at all follow up in 6-8 weeks for possible paring and cryotherapy.

## 2012-11-15 NOTE — Progress Notes (Signed)
  Subjective:    Patient ID: Joyce Brock, female    DOB: 1965/07/09, 47 y.o.   MRN: [id number]  HPI  47 year old female presents with new lesion on sole of right foot present for 6 months.  Area is tender with pressure and slightly with standing. No redness, no discharge.   Has tried corn pads.   Feeling well otherwise.  Review of Systems  Constitutional: Negative for fever and fatigue.  HENT: Negative for ear pain.   Eyes: Negative for pain.  Respiratory: Negative for chest tightness and shortness of breath.   Cardiovascular: Negative for chest pain, palpitations and leg swelling.  Gastrointestinal: Negative for abdominal pain.  Genitourinary: Negative for dysuria.       Objective:   Physical Exam  Constitutional: Vital signs are normal. She appears well-developed and well-nourished. She is cooperative.  Non-toxic appearance. She does not appear ill. No distress.  HENT:  Head: Normocephalic.  Right Ear: Hearing, tympanic membrane, external ear and ear canal normal. Tympanic membrane is not erythematous, not retracted and not bulging.  Left Ear: Hearing, tympanic membrane, external ear and ear canal normal. Tympanic membrane is not erythematous, not retracted and not bulging.  Nose: No mucosal edema or rhinorrhea. Right sinus exhibits no maxillary sinus tenderness and no frontal sinus tenderness. Left sinus exhibits no maxillary sinus tenderness and no frontal sinus tenderness.  Mouth/Throat: Uvula is midline, oropharynx is clear and moist and mucous membranes are normal.  Eyes: Conjunctivae, EOM and lids are normal. Pupils are equal, round, and reactive to light. No foreign bodies found.  Neck: Trachea normal and normal range of motion. Neck supple. Carotid bruit is not present. No mass and no thyromegaly present.  Cardiovascular: Normal rate, regular rhythm, S1 normal, S2 normal, normal heart sounds, intact distal pulses and normal pulses.  Exam reveals no gallop and no  friction rub.   No murmur heard. Pulmonary/Chest: Effort normal and breath sounds normal. Not tachypneic. No respiratory distress. She has no decreased breath sounds. She has no wheezes. She has no rhonchi. She has no rales.  Abdominal: Soft. Normal appearance and bowel sounds are normal. There is no tenderness.  Neurological: She is alert.  Skin: Skin is warm, dry and intact. No rash noted.  3 areas of thickened skin on right sole with central cores, no erythema  Psychiatric: Her speech is normal and behavior is normal. Judgment and thought content normal. Her mood appears not anxious. Cognition and memory are normal. She does not exhibit a depressed mood.          Assessment & Plan:

## 2013-04-03 ENCOUNTER — Encounter: Payer: Self-pay | Admitting: Family Medicine

## 2013-04-17 ENCOUNTER — Other Ambulatory Visit: Payer: Self-pay

## 2013-04-18 ENCOUNTER — Ambulatory Visit (INDEPENDENT_AMBULATORY_CARE_PROVIDER_SITE_OTHER): Payer: 59 | Admitting: Family Medicine

## 2013-04-18 ENCOUNTER — Encounter: Payer: Self-pay | Admitting: Family Medicine

## 2013-04-18 VITALS — BP 120/86 | HR 103 | Temp 98.2°F | Ht 66.5 in | Wt 164.8 lb

## 2013-04-18 DIAGNOSIS — B079 Viral wart, unspecified: Secondary | ICD-10-CM

## 2013-04-18 DIAGNOSIS — B078 Other viral warts: Secondary | ICD-10-CM

## 2013-04-18 NOTE — Progress Notes (Signed)
Pre-visit discussion using our clinic review tool. No additional management support is needed unless otherwise documented below in the visit note.  

## 2013-04-18 NOTE — Assessment & Plan Note (Addendum)
  Procedure Note: Cryotherapy and shave of lesion... Used 11 blade scalpel to pare lesion after 2 cycles of cryotherapy with 2 mm halo. Concluded with final cryotherapy 2 mm halo.  Pt tolerated procedure with no bleeding and minimal pain. Instructed pt to use salicylic acid x 7 days after cryotharapy healed, then follow up in 3 weeks for possible repeat cryotherapy.

## 2013-04-18 NOTE — Progress Notes (Signed)
47 year old female presents with new lesion on sole of right foot present for 11 months.   Area is tender with pressure and slightly with standing.  No redness, no discharge.  Has tried corn pads.  At last OV in 11/2012... She was told to apply duct tape and salicylic acid.  She noted minimal improvement in warts.   Review of Systems  Constitutional: Negative for fever and fatigue.  HENT: Negative for ear pain.  Eyes: Negative for pain.  Respiratory: Negative for chest tightness and shortness of breath.  Cardiovascular: Negative for chest pain, palpitations and leg swelling.  Gastrointestinal: Negative for abdominal pain.  Genitourinary: Negative for dysuria.  Objective:   Physical Exam  Constitutional: Vital signs are normal. She appears well-developed and well-nourished. She is cooperative. Non-toxic appearance. She does not appear ill. No distress.  HENT:  Head: Normocephalic.  Right Ear: Hearing, tympanic membrane, external ear and ear canal normal. Tympanic membrane is not erythematous, not retracted and not bulging.  Left Ear: Hearing, tympanic membrane, external ear and ear canal normal. Tympanic membrane is not erythematous, not retracted and not bulging.  Nose: No mucosal edema or rhinorrhea. Right sinus exhibits no maxillary sinus tenderness and no frontal sinus tenderness. Left sinus exhibits no maxillary sinus tenderness and no frontal sinus tenderness.  Mouth/Throat: Uvula is midline, oropharynx is clear and moist and mucous membranes are normal.  Eyes: Conjunctivae, EOM and lids are normal. Pupils are equal, round, and reactive to light. No foreign bodies found.  Neck: Trachea normal and normal range of motion. Neck supple. Carotid bruit is not present. No mass and no thyromegaly present.  Cardiovascular: Normal rate, regular rhythm, S1 normal, S2 normal, normal heart sounds, intact distal pulses and normal pulses. Exam reveals no gallop and no friction rub.  No murmur  heard.  Pulmonary/Chest: Effort normal and breath sounds normal. Not tachypneic. No respiratory distress. She has no decreased breath sounds. She has no wheezes. She has no rhonchi. She has no rales.  Abdominal: Soft. Normal appearance and bowel sounds are normal. There is no tenderness.  Neurological: She is alert.  Skin: Skin is warm, dry and intact. No rash noted.  1 areas of thickened skin on right sole with central core, no erythema  Psychiatric: Her speech is normal and behavior is normal. Judgment and thought content normal. Her mood appears not anxious. Cognition and memory are normal. She does not exhibit a depressed mood.

## 2013-04-18 NOTE — Patient Instructions (Addendum)
Once lesion healing from cryptherapy after 1 week.Marland KitchenMarland KitchenApply salicylic acid for at least seven more days to peel off more skin in an attempt to prevent recurrences  Return visit in three weeks to assess therapy; we will consider repeating a lighter application of liquid nitrogen to treated sites.

## 2013-05-15 ENCOUNTER — Ambulatory Visit: Payer: 59 | Admitting: Family Medicine

## 2013-05-16 ENCOUNTER — Encounter: Payer: Self-pay | Admitting: Family Medicine

## 2013-05-16 ENCOUNTER — Ambulatory Visit (INDEPENDENT_AMBULATORY_CARE_PROVIDER_SITE_OTHER): Payer: 59 | Admitting: Family Medicine

## 2013-05-16 VITALS — BP 120/80 | HR 92 | Temp 97.8°F | Ht 66.5 in | Wt 164.5 lb

## 2013-05-16 DIAGNOSIS — B078 Other viral warts: Secondary | ICD-10-CM

## 2013-05-16 DIAGNOSIS — B079 Viral wart, unspecified: Secondary | ICD-10-CM

## 2013-05-16 NOTE — Progress Notes (Signed)
   Subjective:    Patient ID: Emary Zalar, female    DOB: 10-17-1965, 47 y.o.   MRN: [id number]  HPI  47 year old female presents for 3 week follow up wart s/p cryotherapy.  Treating with salicylic acid. She reports the lesion has improved greatly... Felt maybe in last few days central firm are returning, but did have some skin peel off since.    Review of Systems  Constitutional: Negative for fever and fatigue.  HENT: Negative for ear pain.   Eyes: Negative for pain.  Respiratory: Negative for chest tightness and shortness of breath.   Cardiovascular: Negative for chest pain, palpitations and leg swelling.  Gastrointestinal: Negative for abdominal pain.  Genitourinary: Negative for dysuria.       Objective:   Physical Exam  Skin:  Peeling skin at right sole of foot, area when lesion was there may be more firmness and possible blood vessels.          Assessment & Plan:

## 2013-05-16 NOTE — Progress Notes (Signed)
Pre-visit discussion using our clinic review tool. No additional management support is needed unless otherwise documented below in the visit note.  

## 2013-05-16 NOTE — Patient Instructions (Signed)
Salisylic acxid for 7 days, call if returning.

## 2013-05-16 NOTE — Assessment & Plan Note (Addendum)
Treated with one course of crypotherapy. 2mm halo. No SE.

## 2013-07-02 ENCOUNTER — Other Ambulatory Visit: Payer: Self-pay | Admitting: Family Medicine

## 2013-07-02 NOTE — Telephone Encounter (Signed)
Pt last office visit was 05/15/13.. Last lipid pannel was 03/29/12.  Ok to refill?

## 2013-07-03 NOTE — Telephone Encounter (Signed)
Refill once but pt needs to make an appt for high cholesterol with labs prior or CPX if not done elsewhere.

## 2014-05-01 ENCOUNTER — Encounter: Payer: Self-pay | Admitting: Family Medicine

## 2014-07-17 ENCOUNTER — Ambulatory Visit (INDEPENDENT_AMBULATORY_CARE_PROVIDER_SITE_OTHER): Payer: 59 | Admitting: Podiatrist

## 2014-07-17 ENCOUNTER — Encounter: Payer: Self-pay | Admitting: Podiatrist

## 2014-07-17 VITALS — BP 139/90 | HR 99 | Resp 12

## 2014-07-17 DIAGNOSIS — B070 Plantar wart: Secondary | ICD-10-CM

## 2014-07-17 DIAGNOSIS — B351 Tinea unguium: Secondary | ICD-10-CM

## 2014-07-17 MED ORDER — EFINACONAZOLE 10 % EX SOLN: 1.0000 [drp] | Freq: Every day | CUTANEOUS | Status: DC

## 2014-07-17 MED ORDER — FLUOROURACIL 0.5 % EX CREA: TOPICAL_CREAM | Freq: Every day | CUTANEOUS | Status: DC

## 2014-07-17 NOTE — Progress Notes (Signed)
   Subjective:    Patient ID: Joyce Brock, female    DOB: February 23, 1966, 49 y.o.   MRN: [id number]  HPI  PT STATED RT FOOT HAVE WARTS AND BEEN HURTING FOR 3 YEARS. THE FOOT IS GETTING BETTER BUT SOMETIMES IS SORE WHEN PUTTING PRESSURE. TRIED DR.BEDSOLE TRIED FREEZE OFF BUT NO HELP.  ALSO, LT FOOT GREAT TOENAIL HAVE DISCOLORATION.  Review of Systems  Neurological: Positive for light-headedness and headaches.  All other systems reviewed and are negative.      Objective:   Physical Exam Patient is awake, alert, and oriented x 3.  In no acute distress.  Vascular status is intact with palpable pedal pulses at 2/4 DP and PT bilateral and capillary refill time within normal limits. Neurological sensation is also intact bilaterally via Semmes Weinstein monofilament at 5/5 sites. Light touch, vibratory sensation, Achilles tendon reflex is intact. Dermatological exam reveals skin color, turger and texture as normal. No open lesions present.  Musculature intact with dorsiflexion, plantarflexion, inversion, eversion. A warty lesion is present submetatarsal 3 of the right foot.  There is multiple capillary budding throughout and califlour like appearance present.  Pain with direct pressure is also seen and skin tension lines are noted to be absent.  Left hallux nail is mycotic in appearance. Yellow brown discoloration is present with subungual debris noted.       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  Applied canthacur to the lesion today and wrote a rx for carac gel.  Sample of the nail taken for pathology-Also recommended jublia for the nail.  rx written and coupon dispensed. She will call if she has any difficulty in obtaining the medication.

## 2014-07-17 NOTE — Patient Instructions (Signed)
Plantar Warts Warts are benign (noncancerous) growths of the outer skin layer. They can occur at any time in life but are most common during childhood and the teen years. Warts can occur on many skin surfaces of the body. When they occur on the underside (sole) of your foot they are called plantar warts. They often emerge in groups with several small warts encircling a larger growth. CAUSES  Human papillomavirus (HPV) is the cause of plantar warts. HPV attacks a break in the skin of the foot. Walking barefoot can lead to exposure to the wart virus. Plantar warts tend to develop over areas of pressure such as the heel and ball of the foot. Plantar warts often grow into the deeper layers of skin. They may spread to other areas of the sole but cannot spread to other areas of the body. SYMPTOMS  You may also notice a growth on the undersurface of your foot. The wart may grow directly into the sole of the foot, or rise above the surface of the skin on the sole of the foot, or both. They are most often flat from pressure. Warts generally do not cause itching but may cause pain in the area of the wart when you put weight on your foot. DIAGNOSIS  Diagnosis is made by physical examination. This means your caregiver discovers it while examining your foot.  TREATMENT  There are many ways to treat plantar warts. However, warts are very tough. Sometimes it is difficult to treat them so that they go away completely and do not grow back. Any treatment must be done regularly to work. If left untreated, most plantar warts will eventually disappear over a period of one to two years. Treatments you can do at home include:  Putting duct tape over the top of the wart (occlusion) has been found to be effective over several months. The duct tape should be removed each night and reapplied until the wart has disappeared.  Placing over-the-counter medications on top of the wart to help kill the wart virus and remove the wart  tissue (salicylic acid, cantharidin, and dichloroacetic acid) are useful. These are called keratolytic agents. These medications make the skin soft and gradually layers will shed away. These compounds are usually placed on the wart each night and then covered with a bandage. They are also available in premedicated bandage form. Avoid surrounding skin when applying these liquids as these medications can burn healthy skin. The treatment may take several months of nightly use to be effective.  Cryotherapy to freeze the wart has recently become available over-the-counter for children 4 years and older. This system makes use of a soft narrow applicator connected to a bottle of compressed cold liquid that is applied directly to the wart. This medication can burn healthy skin and should be used with caution.  As with all over-the-counter medications, read the directions carefully before use. Treatments generally done in your caregiver's office include:  Some aggressive treatments may cause discomfort, discoloration, and scarring of the surrounding skin. The risks and benefits of treatment should be discussed with your caregiver.  Freezing the wart with liquid nitrogen (cryotherapy, see above).  Burning the wart with use of very high heat (cautery).  Injecting medication into the wart.  Surgically removing or laser treatment of the wart.  Your caregiver may refer you to a dermatologist for difficult to treat large-sized warts or large numbers of warts. HOME CARE INSTRUCTIONS   Soak the affected area in warm water. Dry the   area completely when you are done. Remove the top layer of softened skin, then apply the chosen topical medication and reapply a bandage.  Remove the bandage daily and file excess wart tissue (pumice stone works well for this purpose). Repeat the entire process daily or every other day for weeks until the plantar wart disappears.  Several brands of salicylic acid pads are available  as over-the-counter remedies.  Pain can be relieved by wearing a donut bandage. This is a bandage with a hole in it. The bandage is put on with the hole over the wart. This helps take the pressure off the wart and gives pain relief. To help prevent plantar warts:  Wear shoes and socks and change them daily.  Keep feet clean and dry.  Check your feet and your children's feet regularly.  Avoid direct contact with warts on other people.  Have growths or changes on your skin checked by your caregiver. Document Released: 08/19/2003 Document Revised: 10/13/2013 Document Reviewed: 01/27/2009 Exit[employer] Patient Information 2015 [employer]. This information is not intended to replace advice given to you by your health care provider. Make sure you discuss any questions you have with your health care provider.  

## 2014-07-23 ENCOUNTER — Telehealth: Payer: Self-pay | Admitting: *Deleted

## 2014-07-23 NOTE — Telephone Encounter (Signed)
Sure, that would be great. Thanks!

## 2014-07-23 NOTE — Telephone Encounter (Addendum)
[place] asked if the Carac could be changed to Efudex it would be covered by insurance.  Dr. Valentina Lucks ordered change to Efudex.

## 2014-07-24 MED ORDER — FLUOROURACIL 5 % EX CREA: TOPICAL_CREAM | Freq: Two times a day (BID) | CUTANEOUS | Status: DC

## 2014-08-14 ENCOUNTER — Ambulatory Visit (INDEPENDENT_AMBULATORY_CARE_PROVIDER_SITE_OTHER): Payer: 59 | Admitting: Podiatrist

## 2014-08-14 ENCOUNTER — Encounter: Payer: Self-pay | Admitting: Podiatrist

## 2014-08-14 VITALS — BP 121/85 | HR 86 | Resp 16

## 2014-08-14 DIAGNOSIS — B070 Plantar wart: Secondary | ICD-10-CM

## 2014-08-14 NOTE — Progress Notes (Signed)
   Subjective:    Patient ID: Joyce Brock, female    DOB: 1965-11-21, 49 y.o.   MRN: [id number]  HPI  PT STATED RT FOOT HAVE WARTS AND BEEN HURTING FOR 3 YEARS. THE FOOT IS GETTING BETTER BUT SOMETIMES IS SORE WHEN PUTTING PRESSURE. TRIED DR.BEDSOLE TRIED FREEZE OFF BUT NO HELP.  ALSO, LT FOOT GREAT TOENAIL HAVE DISCOLORATION.  Review of Systems  Neurological: Positive for light-headedness and headaches.  All other systems reviewed and are negative.      Objective:   Physical Exam Patient is awake, alert, and oriented x 3.  In no acute distress.  Vascular status is intact with palpable pedal pulses at 2/4 DP and PT bilateral and capillary refill time within normal limits. Neurological sensation is also intact bilaterally via Semmes Weinstein monofilament at 5/5 sites. Light touch, vibratory sensation, Achilles tendon reflex is intact. Dermatological exam reveals skin color, turger and texture as normal. No open lesions present.  Musculature intact with dorsiflexion, plantarflexion, inversion, eversion. A warty lesion is present submetatarsal 3 of the right foot. Improved appearance in the verruca is noted. There is less so capillary budding throughout skin tension lines are present.  Minimal Pain with direct pressure is also seen. Left hallux nail is mycotic in appearance. Yellow brown discoloration is present with subungual debris noted.       Assessment & Plan:  Improving Warty skin lesion plantar right foot,   Plan:  Recommended continued topical therapies for the treatment of both the wart and the nail. She will watch for skin tension lines and will call if there is no improvement in 3 weeks

## 2014-09-03 ENCOUNTER — Telehealth: Payer: Self-pay | Admitting: *Deleted

## 2014-09-03 ENCOUNTER — Encounter: Payer: Self-pay | Admitting: Podiatrist

## 2014-09-03 NOTE — Telephone Encounter (Signed)
I called patient to give her culture results.  Dr. Valentina Lucks said it did come back positive for fungus.  She said to continue using the topical for 4-6 weeks.  If you don't notice any improvement call and let us know and she will prescribe an oral, Lamisil.  "Okay, that sounds fine.  Thank you for calling."

## 2014-09-15 ENCOUNTER — Telehealth: Payer: Self-pay | Admitting: Family Medicine

## 2014-09-15 DIAGNOSIS — E7800 Pure hypercholesterolemia, unspecified: Secondary | ICD-10-CM

## 2014-09-15 NOTE — Telephone Encounter (Signed)
-----   Message from [name] sent at 09/15/2014  2:36 PM EDT ----- Regarding: Lab orders for Thursday, 4.7.16 Patient is scheduled for CPX labs, please order future labs, Thanks , [name]

## 2014-09-17 ENCOUNTER — Other Ambulatory Visit (INDEPENDENT_AMBULATORY_CARE_PROVIDER_SITE_OTHER): Payer: 59

## 2014-09-17 DIAGNOSIS — E7800 Pure hypercholesterolemia, unspecified: Secondary | ICD-10-CM

## 2014-09-17 LAB — LIPID PANEL
CHOL/HDL RATIO: 5
Cholesterol: 224 mg/dL — ABNORMAL HIGH (ref 0–200)
HDL: 45.7 mg/dL (ref >39.00)
LDL CALC: 157 mg/dL — AB (ref 0–99)
NonHDL: 178.3
Triglycerides: 108 mg/dL (ref 0.0–149.0)
VLDL: 21.6 mg/dL (ref 0.0–40.0)

## 2014-09-17 LAB — COMPREHENSIVE METABOLIC PANEL
ALK PHOS: 64 U/L (ref 39–117)
ALT: 14 U/L (ref 0–35)
AST: 14 U/L (ref 0–37)
Albumin: 4 g/dL (ref 3.5–5.2)
BUN: 11 mg/dL (ref 6–23)
CHLORIDE: 103 meq/L (ref 96–112)
CO2: 30 mEq/L (ref 19–32)
CREATININE: 0.86 mg/dL (ref 0.40–1.20)
Calcium: 9.6 mg/dL (ref 8.4–10.5)
GFR: 74.34 mL/min (ref >60.00)
Glucose, Bld: 90 mg/dL (ref 70–99)
POTASSIUM: 4.1 meq/L (ref 3.5–5.1)
SODIUM: 137 meq/L (ref 135–145)
Total Bilirubin: 0.4 mg/dL (ref 0.2–1.2)
Total Protein: 7 g/dL (ref 6.0–8.3)

## 2014-09-18 ENCOUNTER — Other Ambulatory Visit: Payer: 59

## 2014-09-25 ENCOUNTER — Ambulatory Visit (INDEPENDENT_AMBULATORY_CARE_PROVIDER_SITE_OTHER): Payer: 59 | Admitting: Family Medicine

## 2014-09-25 ENCOUNTER — Other Ambulatory Visit (HOSPITAL_COMMUNITY)
Admission: RE | Admit: 2014-09-25 | Discharge: 2014-09-25 | Disposition: A | Discharge status: Home / Self Care | Payer: 59 | Source: Ambulatory Visit | Attending: Family Medicine | Admitting: Family Medicine

## 2014-09-25 ENCOUNTER — Encounter: Payer: Self-pay | Admitting: Family Medicine

## 2014-09-25 VITALS — BP 110/80 | HR 90 | Temp 98.8°F | Ht 66.5 in | Wt 153.5 lb

## 2014-09-25 DIAGNOSIS — Z01419 Encounter for gynecological examination (general) (routine) without abnormal findings: Secondary | ICD-10-CM | POA: Diagnosis not present

## 2014-09-25 DIAGNOSIS — M79622 Pain in left upper arm: Secondary | ICD-10-CM | POA: Insufficient documentation

## 2014-09-25 DIAGNOSIS — Z0000 Encounter for general adult medical examination without abnormal findings: Secondary | ICD-10-CM | POA: Diagnosis not present

## 2014-09-25 DIAGNOSIS — Z124 Encounter for screening for malignant neoplasm of cervix: Secondary | ICD-10-CM

## 2014-09-25 DIAGNOSIS — Z1151 Encounter for screening for human papillomavirus (HPV): Secondary | ICD-10-CM | POA: Diagnosis present

## 2014-09-25 NOTE — Patient Instructions (Addendum)
Start exercise regularly. Keep up great work with [employer]. Start red yeast rice 600 mg 2 cap twice daily. Can use ibuprofen 800 mg every 8 hours for pain and inflammation in left upper arm. Start home stretching of upper arm. Make appt if pain in arm not improving.

## 2014-09-25 NOTE — Progress Notes (Signed)
Pre visit review using our clinic review tool, if applicable. No additional management support is needed unless otherwise documented below in the visit note. 

## 2014-09-25 NOTE — Progress Notes (Signed)
Subjective:    Patient ID: Joyce Brock, female    DOB: 26-Mar-1966, 49 y.o.   MRN: [id number]  HPI  49 year old female presents for wellness visit.   She also has additional issue with new onset pain in left lateral upper arm ongoing x several months. Was off and on but now anytime if reaching above head/strthcing upper arm. No pain to touch, only pain with movement. 5/10 on pain scale. Improves with rest and rubbing. No known injury or fall. She does sleep with arm under pillow, was sore so stopped doing this. No weakness or numbness.  no new neck pain.  Reviewed labs in detail with pt.  High chol: LDL  NOT at goal < 130. On atorvastain, she report she has not been taking in last 6-8 months. given hip pain.  Arm pain not any better of med.  tried red yeast rice in past but did not take regularly. Lab Results  Component Value Date   CHOL 224* 09/17/2014   HDL 45.70 09/17/2014   LDLCALC 157* 09/17/2014   LDLDIRECT 168.7 03/22/2012   TRIG 108.0 09/17/2014   CHOLHDL 5 09/17/2014  Using medications without problems: Muscle aches:  Diet compliance: Weight watchers since 07/2014 Exercise:None Other complaints:  Body mass index is 24.41 kg/(m^2).   BP Readings from Last 3 Encounters:  09/25/14 110/80  08/14/14 121/85  07/17/14 139/90   Wt Readings from Last 3 Encounters:  09/25/14 153 lb 8 oz (69.627 kg)  05/16/13 164 lb 8 oz (74.617 kg)  04/18/13 164 lb 12 oz (74.73 kg)       Review of Systems  Constitutional: Negative for fever, fatigue and unexpected weight change.  HENT: Negative for congestion, ear pain, sinus pressure, sneezing, sore throat and trouble swallowing.   Eyes: Negative for pain and itching.  Respiratory: Negative for cough, shortness of breath and wheezing.   Cardiovascular: Negative for chest pain, palpitations and leg swelling.  Gastrointestinal: Negative for nausea, abdominal pain, diarrhea, constipation and blood in stool.    Genitourinary: Negative for dysuria, hematuria, vaginal discharge, difficulty urinating and menstrual problem.  Musculoskeletal: Negative for myalgias.       Right lateral arm pain  Skin: Negative for rash.  Neurological: Negative for syncope, weakness, light-headedness, numbness and headaches.  Psychiatric/Behavioral: Negative for confusion and dysphoric mood. The patient is not nervous/anxious.        Objective:   Physical Exam  Constitutional: Vital signs are normal. She appears well-developed and well-nourished. She is cooperative.  Non-toxic appearance. She does not appear ill. No distress.  HENT:  Head: Normocephalic.  Right Ear: Hearing, tympanic membrane, external ear and ear canal normal.  Left Ear: Hearing, tympanic membrane, external ear and ear canal normal.  Nose: Nose normal.  Eyes: Conjunctivae, EOM and lids are normal. Pupils are equal, round, and reactive to light. Lids are everted and swept, no foreign bodies found.  Neck: Trachea normal and normal range of motion. Neck supple. Carotid bruit is not present. No thyroid mass and no thyromegaly present.  Cardiovascular: Normal rate, regular rhythm, S1 normal, S2 normal, normal heart sounds and intact distal pulses.  Exam reveals no gallop.   No murmur heard. Pulmonary/Chest: Effort normal and breath sounds normal. No respiratory distress. She has no wheezes. She has no rhonchi. She has no rales.  Abdominal: Soft. Normal appearance and bowel sounds are normal. She exhibits no distension, no fluid wave, no abdominal bruit and no mass. There is no hepatosplenomegaly. There  is no tenderness. There is no rebound, no guarding and no CVA tenderness. No hernia.  Genitourinary: Vagina normal and uterus normal. No breast swelling, tenderness, discharge or bleeding. Pelvic exam was performed with patient supine. There is no rash, tenderness or lesion on the right labia. There is no rash, tenderness or lesion on the left labia. Uterus is  not enlarged and not tender. Cervix exhibits no motion tenderness, no discharge and no friability. Right adnexum displays no mass, no tenderness and no fullness. Left adnexum displays no mass, no tenderness and no fullness.  Musculoskeletal:       Right shoulder: Normal.       Right upper arm: She exhibits tenderness. She exhibits no bony tenderness, no swelling and no edema.  Neg empty can, full ROM ttp over deltoid  Lymphadenopathy:    She has no cervical adenopathy.    She has no axillary adenopathy.  Neurological: She is alert. She has normal strength. No cranial nerve deficit or sensory deficit.  Skin: Skin is warm, dry and intact. No rash noted.  Psychiatric: Her speech is normal and behavior is normal. Judgment normal. Her mood appears not anxious. Cognition and memory are normal. She does not exhibit a depressed mood.          Assessment & Plan:  The patient's preventative maintenance and recommended screening tests for an annual wellness exam were reviewed in full today. Brought up to date unless services declined.  Counselled on the importance of diet, exercise, and its role in overall health and mortality. The patient's FH and SH was reviewed, including their home life, tobacco status, and drug and alcohol status.   Vaccines: uptodate, got flu last year.  Mammo: 04/2014 nml, sister with breast cancer.  Smoking:non  STD screen/ HIV : refused.  PAP/DVE: Last nml 2012, due pap and DVE.

## 2014-09-28 LAB — CYTOLOGY - PAP

## 2014-09-29 ENCOUNTER — Encounter: Payer: Self-pay | Admitting: *Deleted

## 2014-10-27 NOTE — Assessment & Plan Note (Signed)
MSK strain. Can use ibuprofen 800 mg every 8 hours for pain and inflammation in left upper arm. Start home stretching of upper arm. Make appt if pain in arm not improving.

## 2014-12-25 ENCOUNTER — Telehealth: Payer: Self-pay | Admitting: Family Medicine

## 2014-12-25 ENCOUNTER — Other Ambulatory Visit (INDEPENDENT_AMBULATORY_CARE_PROVIDER_SITE_OTHER): Payer: 59

## 2014-12-25 DIAGNOSIS — E7800 Pure hypercholesterolemia, unspecified: Secondary | ICD-10-CM

## 2014-12-25 LAB — COMPREHENSIVE METABOLIC PANEL
ALT: 15 U/L (ref 0–35)
AST: 15 U/L (ref 0–37)
Albumin: 4.2 g/dL (ref 3.5–5.2)
Alkaline Phosphatase: 65 U/L (ref 39–117)
BUN: 13 mg/dL (ref 6–23)
CO2: 31 mEq/L (ref 19–32)
Calcium: 9.7 mg/dL (ref 8.4–10.5)
Chloride: 103 mEq/L (ref 96–112)
Creatinine, Ser: 0.82 mg/dL (ref 0.40–1.20)
GFR: 78.46 mL/min (ref >60.00)
Glucose, Bld: 90 mg/dL (ref 70–99)
POTASSIUM: 4 meq/L (ref 3.5–5.1)
Sodium: 140 mEq/L (ref 135–145)
TOTAL PROTEIN: 7.3 g/dL (ref 6.0–8.3)
Total Bilirubin: 0.4 mg/dL (ref 0.2–1.2)

## 2014-12-25 LAB — LIPID PANEL
CHOLESTEROL: 242 mg/dL — AB (ref 0–200)
HDL: 55.6 mg/dL (ref >39.00)
LDL CALC: 163 mg/dL — AB (ref 0–99)
NONHDL: 186.4
Total CHOL/HDL Ratio: 4
Triglycerides: 117 mg/dL (ref 0.0–149.0)
VLDL: 23.4 mg/dL (ref 0.0–40.0)

## 2014-12-25 NOTE — Telephone Encounter (Signed)
-----   Message from [name] sent at 12/17/2014 11:01 AM EDT ----- Regarding: Lab orders for Friday,7.15.16 Lab orders for f/u appt

## 2014-12-30 ENCOUNTER — Telehealth: Payer: Self-pay | Admitting: Family Medicine

## 2014-12-30 MED ORDER — COLESEVELAM HCL 625 MG PO TABS: 1875.0000 mg | ORAL_TABLET | Freq: Two times a day (BID) | ORAL | Status: DC

## 2014-12-30 NOTE — Addendum Note (Signed)
Addended byEliezer Lofts E on: 12/30/2014 05:09 PM   Modules accepted: Orders

## 2014-12-30 NOTE — Telephone Encounter (Signed)
Make sure pt has appt to check chol in 3 months.

## 2014-12-30 NOTE — Telephone Encounter (Signed)
-----   Message from Carter Kitten, [place] sent at 12/30/2014  3:04 PM EDT ----- [name] notified as instructed by telephone.  She has been taking the Red Yeast Rice but not always two tablets twice a day. She states she has been also taking CoQ10 and Flaxseed as well.  She had muscle/joint pain on statins in the past, but she is willing to try a trial of Welchol.  Please send Rx to [facility] on [address], [place].

## 2014-12-31 NOTE — Telephone Encounter (Signed)
Joyce Brock notified as instructed by telephone.  She will call back to schedule lab appointment in three months to recheck cholesterol.

## 2015-02-04 ENCOUNTER — Ambulatory Visit (INDEPENDENT_AMBULATORY_CARE_PROVIDER_SITE_OTHER): Payer: 59 | Admitting: Podiatry

## 2015-02-04 ENCOUNTER — Encounter: Payer: Self-pay | Admitting: Podiatry

## 2015-02-04 VITALS — BP 124/80 | HR 95 | Resp 16

## 2015-02-04 DIAGNOSIS — B070 Plantar wart: Secondary | ICD-10-CM | POA: Diagnosis not present

## 2015-02-04 NOTE — Patient Instructions (Signed)

## 2015-02-05 NOTE — Progress Notes (Signed)
She presents today with chief complaint of a painful wart to the plantar aspect of her right foot. She states this been here for quite some time and she has tried some topical creams which have helped to some degree. However this seems not to want to go away. I have reviewed her past medical history medications allergies surgery social history review of systems.    objective: vital signs are stable she is alert and oriented 54. 49 year old white female in no acute distress. Pulses are strongly palpable neurologic sensorium is intact per Semmes-Weinstein monofilament. Deep tendon reflexes are intact bilateral and muscle strength is 5 over 5 dorsiflexion plantar flexors and inverters everters all intrinsic musculature is intact. Orthopedic evaluation demonstrates all joints distal to the ankle have full range of motion without crepitation. Cutaneous evaluation demonstrates supple well-hydrated cutis no erythema edema cellulitis drainage or odor one verrucoid lesion plantar aspect of the forefoot right measuring greater than 1 cm in diameter appears to be superficial. Irregular in shape.   Assessment: Verruca plantaris right foot.  Plan:  Surgical excision soft tissue lesion plantar aspect right foot greater than 1 cm was performed after local anesthesia was administered. She tolerated this procedure well the lesion was sent for pathologic evaluation and will follow up with her in 1 week. In the meantime she will continue to soak the wound twice a day and cover with a light dressing. Follow up with me if necessary.

## 2015-02-11 ENCOUNTER — Encounter: Payer: Self-pay | Admitting: Podiatry

## 2015-02-11 ENCOUNTER — Ambulatory Visit (INDEPENDENT_AMBULATORY_CARE_PROVIDER_SITE_OTHER): Payer: 59 | Admitting: Podiatry

## 2015-02-11 VITALS — BP 129/76 | HR 92 | Resp 12

## 2015-02-11 DIAGNOSIS — B070 Plantar wart: Secondary | ICD-10-CM

## 2015-02-12 NOTE — Progress Notes (Signed)
She presents today 1 week status post surgical curettage wart plantar aspect right foot. She states that he still gets a little sore. She denies fever chills nausea vomiting muscle aches and pains.  Objective: Vital signs are stable she is alert and oriented 3. Pulses are strongly palpable bilateral. Neurologic sensorium is intact per Semmes-Weinstein monofilament. Wound to the plantar aspect of the right foot is superficial and demonstrates epithelialization. Nice granulation tissue is present. Margins appear to be contracting. No signs of infection. No erythema edema cellulitis drainage or odor.  Assessment: Well-healing surgical foot right.  Plan: Discussed etiology pathology conservative versus surgical therapies. Encouraged her to discontinue the use of Neosporin. She will start soaking in Epsom salts and water daily. Apply a small amount of Aquaphor ointment to the wound. Continue to do so until completely resolved. She will cover during the day and leave open at night time. I will follow-up with her in 2 weeks if necessary.

## 2015-02-25 ENCOUNTER — Ambulatory Visit: Payer: 59 | Admitting: Podiatry

## 2015-03-04 ENCOUNTER — Telehealth: Payer: Self-pay | Admitting: *Deleted

## 2015-03-04 NOTE — Telephone Encounter (Signed)
Dr. Milinda Pointer reviewed pt's biopsy of 02/04/2015 as a plantar wart.  Informed pt and asked status, pt states the area has healed well.

## 2015-04-02 ENCOUNTER — Telehealth: Payer: Self-pay | Admitting: Family Medicine

## 2015-04-02 ENCOUNTER — Other Ambulatory Visit (INDEPENDENT_AMBULATORY_CARE_PROVIDER_SITE_OTHER): Payer: 59

## 2015-04-02 DIAGNOSIS — E7800 Pure hypercholesterolemia, unspecified: Secondary | ICD-10-CM

## 2015-04-02 LAB — COMPREHENSIVE METABOLIC PANEL
ALK PHOS: 67 U/L (ref 39–117)
ALT: 18 U/L (ref 0–35)
AST: 18 U/L (ref 0–37)
Albumin: 4.3 g/dL (ref 3.5–5.2)
BUN: 14 mg/dL (ref 6–23)
CO2: 31 meq/L (ref 19–32)
Calcium: 10 mg/dL (ref 8.4–10.5)
Chloride: 102 mEq/L (ref 96–112)
Creatinine, Ser: 0.81 mg/dL (ref 0.40–1.20)
GFR: 79.49 mL/min (ref >60.00)
Glucose, Bld: 91 mg/dL (ref 70–99)
POTASSIUM: 4.1 meq/L (ref 3.5–5.1)
Sodium: 140 mEq/L (ref 135–145)
Total Bilirubin: 0.5 mg/dL (ref 0.2–1.2)
Total Protein: 7.5 g/dL (ref 6.0–8.3)

## 2015-04-02 LAB — LIPID PANEL
CHOL/HDL RATIO: 4
Cholesterol: 247 mg/dL — ABNORMAL HIGH (ref 0–200)
HDL: 56.1 mg/dL (ref >39.00)
LDL Cholesterol: 170 mg/dL — ABNORMAL HIGH (ref 0–99)
NONHDL: 190.76
Triglycerides: 105 mg/dL (ref 0.0–149.0)
VLDL: 21 mg/dL (ref 0.0–40.0)

## 2015-04-02 NOTE — Telephone Encounter (Signed)
-----   Message from [name] sent at 03/26/2015 12:02 PM EDT ----- Regarding: Lab orders for Friday, 10.21.16 Lab orders, no f/u appt

## 2015-04-19 ENCOUNTER — Encounter: Payer: Self-pay | Admitting: Podiatry

## 2015-05-05 ENCOUNTER — Encounter: Payer: Self-pay | Admitting: Family Medicine

## 2015-07-14 MED FILL — WELCHOL 625 MG TABLET: 625 | 90 days supply | Qty: 540 | Fill #0

## 2015-10-27 MED FILL — WELCHOL 625 MG TABLET: 625 | 90 days supply | Qty: 540 | Fill #1

## 2015-12-30 ENCOUNTER — Encounter: Payer: Self-pay | Admitting: Family Medicine

## 2015-12-30 ENCOUNTER — Ambulatory Visit (INDEPENDENT_AMBULATORY_CARE_PROVIDER_SITE_OTHER): Payer: 59 | Admitting: Family Medicine

## 2015-12-30 VITALS — BP 110/80 | HR 95 | Temp 98.7°F | Ht 66.5 in | Wt 147.2 lb

## 2015-12-30 DIAGNOSIS — R079 Chest pain, unspecified: Secondary | ICD-10-CM

## 2015-12-30 DIAGNOSIS — R5383 Other fatigue: Secondary | ICD-10-CM | POA: Diagnosis not present

## 2015-12-30 LAB — BASIC METABOLIC PANEL
BUN: 11 mg/dL (ref 6–23)
CHLORIDE: 102 meq/L (ref 96–112)
CO2: 29 mEq/L (ref 19–32)
Calcium: 10.1 mg/dL (ref 8.4–10.5)
Creatinine, Ser: 0.87 mg/dL (ref 0.40–1.20)
GFR: 72.98 mL/min (ref >60.00)
Glucose, Bld: 90 mg/dL (ref 70–99)
POTASSIUM: 4.3 meq/L (ref 3.5–5.1)
SODIUM: 139 meq/L (ref 135–145)

## 2015-12-30 LAB — CBC WITH DIFFERENTIAL/PLATELET
BASOS ABS: 0.1 10*3/uL (ref 0.0–0.1)
Basophils Relative: 0.9 % (ref 0.0–3.0)
EOS ABS: 0.1 10*3/uL (ref 0.0–0.7)
Eosinophils Relative: 2.4 % (ref 0.0–5.0)
HCT: 43.7 % (ref 36.0–46.0)
Hemoglobin: 14.8 g/dL (ref 12.0–15.0)
LYMPHS ABS: 2.1 10*3/uL (ref 0.7–4.0)
LYMPHS PCT: 36.5 % (ref 12.0–46.0)
MCHC: 33.9 g/dL (ref 30.0–36.0)
MCV: 85.3 fl (ref 78.0–100.0)
MONO ABS: 0.3 10*3/uL (ref 0.1–1.0)
Monocytes Relative: 5.2 % (ref 3.0–12.0)
NEUTROS ABS: 3.1 10*3/uL (ref 1.4–7.7)
NEUTROS PCT: 55 % (ref 43.0–77.0)
PLATELETS: 199 10*3/uL (ref 150.0–400.0)
RBC: 5.13 Mil/uL — ABNORMAL HIGH (ref 3.87–5.11)
RDW: 13.1 % (ref 11.5–15.5)
WBC: 5.7 10*3/uL (ref 4.0–10.5)

## 2015-12-30 LAB — HEPATIC FUNCTION PANEL
ALT: 16 U/L (ref 0–35)
AST: 17 U/L (ref 0–37)
Albumin: 4.6 g/dL (ref 3.5–5.2)
Alkaline Phosphatase: 76 U/L (ref 39–117)
BILIRUBIN DIRECT: 0.1 mg/dL (ref 0.0–0.3)
BILIRUBIN TOTAL: 0.5 mg/dL (ref 0.2–1.2)
Total Protein: 7.9 g/dL (ref 6.0–8.3)

## 2015-12-30 LAB — VITAMIN D 25 HYDROXY (VIT D DEFICIENCY, FRACTURES): VITD: 30.89 ng/mL (ref 30.00–100.00)

## 2015-12-30 LAB — TSH: TSH: 1.28 u[IU]/mL (ref 0.35–4.50)

## 2015-12-30 LAB — TROPONIN I: TNIDX: 0.01 ug/L (ref 0.00–0.06)

## 2015-12-30 LAB — VITAMIN B12: Vitamin B-12: 247 pg/mL (ref 211–911)

## 2015-12-30 NOTE — Progress Notes (Signed)
Pre visit review using our clinic review tool, if applicable. No additional management support is needed unless otherwise documented below in the visit note. 

## 2015-12-30 NOTE — Progress Notes (Signed)
Dr. Frederico Hamman T. Jamey Demchak, MD, [facility] [place], [address] Phone: [phone] Fax: [phone]  12/30/2015  Patient: Joyce Brock, MRN: [id number], DOB: Feb 15, 1966, 50 y.o.  Primary Physician:  Eliezer Lofts, MD   Chief Complaint  Patient presents with  . Chest Pain    Tightness  . Fatigue  . Dizziness   Subjective:   Joyce Brock is a 50 y.o. very pleasant female patient who presents with the following:  Had some tightness in her chest and ? Shortness of breath, taking deep breaths often and some pain in her jaw. 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 Over the last few days, but it is gone now.   Today feels much better. Some sharp pain in her chest.  H Has felt tired some last weekend.  Increased work some.  Some stress from work  Cardiac risk factors High cholesterol - untreated Former smoker, quit with 5 pack years. No drugs.  No ETOH No HTN FH: h/o Rheumatic fever with valve replacement   Past Medical History, Surgical History, Social History, Family History, Problem List, Medications, and Allergies have been reviewed and updated if relevant.  Patient Active Problem List   Diagnosis Date Noted  . Left upper arm pain 09/25/2014  . Verruca vulgaris 11/15/2012  . Rectal pain 03/29/2012  . Atypical chest pain 12/26/2011  . Palpitations 12/26/2011  . HYPERCHOLESTEROLEMIA 02/05/2009  . MAMMOGRAM, ABNORMAL, LEFT 02/05/2009  . ALLERGIC RHINITIS 10/30/2008  . PSORIASIS 10/30/2008  . URINARY INCONTINENCE, MIXED 10/30/2008    Past Medical History  Diagnosis Date  . Allergy     Past Surgical History  Procedure Laterality Date  . Cholecystectomy      Social History   Social History  . Marital Status: Single    Spouse Name: N/A  . Number of Children: N/A  . Years of Education: N/A   Occupational History  . Not on file.   Social History Main Topics  . Smoking status: Former Research scientist (life sciences)  .  Smokeless tobacco: Never Used  . Alcohol Use: No  . Drug Use: No  . Sexual Activity: Not on file   Other Topics Concern  . Not on file   Social History Narrative    Family History  Problem Relation Age of Onset  . Osteoporosis Mother   . Irritable bowel syndrome Mother   . Cancer Sister 30    breast     No Known Allergies  Medication list reviewed and updated in full in [place].   GEN: No acute illnesses, no fevers, chills. GI: No n/v/d, eating normally Pulm: occ SOB Interactive and getting along well at home.  Otherwise, ROS is as per the HPI.  Objective:   BP 110/80 mmHg  Pulse 95  Temp(Src) 98.7 F (37.1 C) (Oral)  Ht 5' 6.5" (1.689 m)  Wt 147 lb 4 oz (66.792 kg)  BMI 23.41 kg/m2  GEN: WDWN, NAD, Non-toxic, A & O x 3 HEENT: Atraumatic, Normocephalic. Neck supple. No masses, No LAD. Ears and Nose: No external deformity. CV: RRR, No M/G/R. No JVD. No thrill. No extra heart sounds. Chest wall NT PULM: CTA B, no wheezes, crackles, rhonchi. No retractions. No resp. distress. No accessory muscle use. ABD: S, NT, ND, + BS, No rebound, No HSM  EXTR: No c/c/e NEURO Normal gait.  PSYCH: Normally interactive. Conversant. Not depressed or anxious appearing.  Calm demeanor.   Laboratory and Imaging Data:  Assessment and Plan:   Chest pain, unspecified chest pain type - Plan: EKG 12-Lead, Troponin I, ECHOCARDIOGRAM STRESS TEST  Other fatigue - Plan: CBC with Differential/Platelet, Basic metabolic panel, Hepatic function panel, Vitamin B12, TSH, VITAMIN D 25 Hydroxy (Vit-D Deficiency, Fractures)  EKG: Normal sinus rhythm. Normal axis, normal R wave progression, No acute ST elevation or depression - reviewed and compared to 2013 EKG and no appreciable differences  Given clinical concern, check basic laboratories as below, and additionally with chest pain and fatigue, shortness of breath, as well as some pain in the jaw, obtain a stress echocardiogram to  evaluate for ischemic heart disease.  The patient is currently asymptomatic.  She understands to go to the emergency room for evaluation if significant chest pain returns.  Follow-up: No Follow-up on file.  Orders Placed This Encounter  Procedures  . CBC with Differential/Platelet  . Basic metabolic panel  . Hepatic function panel  . Vitamin B12  . TSH  . VITAMIN D 25 Hydroxy (Vit-D Deficiency, Fractures)  . Troponin I  . EKG 12-Lead  . ECHOCARDIOGRAM STRESS TEST    Signed,  Deaken Jurgens T. Ellory Khurana, MD   Patient's Medications  New Prescriptions   No medications on file  Previous Medications   COLESEVELAM (WELCHOL) 625 MG TABLET    Take 3 tablets (1,875 mg total) by mouth 2 (two) times daily with a meal.   LORATADINE-PSEUDOEPHEDRINE (CLARITIN-D 24-HOUR) 10-240 MG PER 24 HR TABLET    Take 1 tablet by mouth daily.   RED YEAST RICE 600 MG CAPS    Take 1,200 mg by mouth 2 (two) times daily.  Modified Medications   No medications on file  Discontinued Medications   No medications on file

## 2016-01-06 ENCOUNTER — Encounter: Payer: 59 | Admitting: Family Medicine

## 2016-01-13 ENCOUNTER — Ambulatory Visit (INDEPENDENT_AMBULATORY_CARE_PROVIDER_SITE_OTHER): Payer: 59 | Admitting: Family Medicine

## 2016-01-13 ENCOUNTER — Encounter: Payer: Self-pay | Admitting: Family Medicine

## 2016-01-13 VITALS — BP 100/66 | HR 97 | Temp 98.3°F | Ht 66.5 in | Wt 150.2 lb

## 2016-01-13 DIAGNOSIS — Z1211 Encounter for screening for malignant neoplasm of colon: Secondary | ICD-10-CM | POA: Diagnosis not present

## 2016-01-13 DIAGNOSIS — E7800 Pure hypercholesterolemia, unspecified: Secondary | ICD-10-CM | POA: Diagnosis not present

## 2016-01-13 DIAGNOSIS — Z0000 Encounter for general adult medical examination without abnormal findings: Secondary | ICD-10-CM

## 2016-01-13 NOTE — Progress Notes (Signed)
50 year old female presents for wellness visit.  She was seen with chest pain, fatigue, dizziness.. EKG was nml. HAs upcoming stress test. No SOB. She has had only one episode since.  Reviewed labs in detail with pt.   High chol: LDL  NOT at goal < 130 in past . Due for re-eval. Now on welchol and red yeast rice.  SE atorvastatin in past. Lab Results  Component Value Date   CHOL 247 (H) 04/02/2015   HDL 56.10 04/02/2015   LDLCALC 170 (H) 04/02/2015   LDLDIRECT 168.7 03/22/2012   TRIG 105.0 04/02/2015   CHOLHDL 4 04/02/2015   Using medications without problems: Muscle aches:  Diet compliance: Weight watchers since 07/2014 Exercise:None Other complaints:  Wt Readings from Last 3 Encounters:  01/13/16 150 lb 4 oz (68.2 kg)  12/30/15 147 lb 4 oz (66.8 kg)  09/25/14 153 lb 8 oz (69.6 kg)   Body mass index is 23.89 kg/m.  BP Readings from Last 3 Encounters:  01/13/16 100/66  12/30/15 110/80  02/11/15 129/76     Social History /Family History/Past Medical History reviewed and updated if needed.    Review of Systems  Constitutional: Negative for fever, fatigue and unexpected weight change.  HENT: Negative for congestion, ear pain, sinus pressure, sneezing, sore throat and trouble swallowing.   Eyes: Negative for pain and itching.  Respiratory: Negative for cough, shortness of breath and wheezing.   Cardiovascular: Negative for chest pain, palpitations and leg swelling.  Gastrointestinal: Negative for nausea, abdominal pain, diarrhea, constipation and blood in stool.  Genitourinary: Negative for dysuria, hematuria, vaginal discharge, difficulty urinating and menstrual problem.  Musculoskeletal: Negative for myalgias.       Right lateral arm pain  Skin: Negative for rash.  Neurological: Negative for syncope, weakness, light-headedness, numbness and headaches.  Psychiatric/Behavioral: Negative for confusion and dysphoric mood. The patient is not nervous/anxious.         Objective:   Physical Exam  Constitutional: Vital signs are normal. She appears well-developed and well-nourished. She is cooperative.  Non-toxic appearance. She does not appear ill. No distress.  HENT:  Head: Normocephalic.  Right Ear: Hearing, tympanic membrane, external ear and ear canal normal.  Left Ear: Hearing, tympanic membrane, external ear and ear canal normal.  Nose: Nose normal.  Eyes: Conjunctivae, EOM and lids are normal. Pupils are equal, round, and reactive to light. Lids are everted and swept, no foreign bodies found.  Neck: Trachea normal and normal range of motion. Neck supple. Carotid bruit is not present. No thyroid mass and no thyromegaly present.  Cardiovascular: Normal rate, regular rhythm, S1 normal, S2 normal, normal heart sounds and intact distal pulses.  Exam reveals no gallop.   No murmur heard. Pulmonary/Chest: Effort normal and breath sounds normal. No respiratory distress. She has no wheezes. She has no rhonchi. She has no rales.  Abdominal: Soft. Normal appearance and bowel sounds are normal. She exhibits no distension, no fluid wave, no abdominal bruit and no mass. There is no hepatosplenomegaly. There is no tenderness. There is no rebound, no guarding and no CVA tenderness. No hernia.  Genitourinary: Vagina normal and uterus normal. No breast swelling, tenderness, discharge or bleeding. Pelvic exam was performed with patient supine. There is no rash, tenderness or lesion on the right labia. There is no rash, tenderness or lesion on the left labia. Uterus is not enlarged and not tender.  NO PAP Right adnexum displays no mass, no tenderness and no fullness. Left adnexum displays  no mass, no tenderness and no fullness.  Lymphadenopathy:    She has no cervical adenopathy.    She has no axillary adenopathy.  Neurological: She is alert. She has normal strength. No cranial nerve deficit or sensory deficit.  Skin: Skin is warm, dry and intact. No rash noted.   Psychiatric: Her speech is normal and behavior is normal. Judgment normal. Her mood appears not anxious. Cognition and memory are normal. She does not exhibit a depressed mood.          Assessment & Plan:  The patient's preventative maintenance and recommended screening tests for an annual wellness exam were reviewed in full today. Brought up to date unless services declined.  Counselled on the importance of diet, exercise, and its role in overall health and mortality. The patient's FH and SH was reviewed, including their home life, tobacco status, and drug and alcohol status.   Vaccines: uptodate  Mammo: 04/2015 nml, sister with breast cancer. BRCA1 and BRCA 2 neg.  Smoking:non  STD screen/ HIV : refused.  PAP/DVE: Last nml 2016 neg co testing, repeat in 5 years, yearly DVE. Colon: IFOb yearly.

## 2016-01-13 NOTE — Progress Notes (Signed)
Pre visit review using our clinic review tool, if applicable. No additional management support is needed unless otherwise documented below in the visit note. 

## 2016-01-13 NOTE — Patient Instructions (Addendum)
Return for chol lab test fasting in next few weeks.  Work on increasing exercise.  Call to schedule mammogram in 04/2016.  Stop at lab for stool cards.

## 2016-01-13 NOTE — Assessment & Plan Note (Signed)
Due for re-eval on welchol and red yeast rice.

## 2016-01-18 ENCOUNTER — Telehealth (HOSPITAL_COMMUNITY): Payer: Self-pay | Admitting: *Deleted

## 2016-01-18 ENCOUNTER — Telehealth: Payer: Self-pay | Admitting: Family Medicine

## 2016-01-18 DIAGNOSIS — E7800 Pure hypercholesterolemia, unspecified: Secondary | ICD-10-CM

## 2016-01-18 NOTE — Telephone Encounter (Signed)
Left message on voicemail per DPR in reference to upcoming appointment scheduled on 01/20/16 at 0730 with detailed instructions given per Stress Test Requisition Sheet for the test. LM to arrive 30 minutes early, and that it is imperative to arrive on time for appointment to keep from having the test rescheduled. If you need to cancel or reschedule your appointment, please call the office within 24 hours of your appointment. Failure to do so may result in a cancellation of your appointment, and a $50 no show fee. Phone number given for call back for any questions. Haidyn Kilburg, Ranae Palms

## 2016-01-18 NOTE — Telephone Encounter (Signed)
-----   Message from [name] sent at 01/14/2016  8:55 AM EDT ----- Regarding: Lab orders for Wednesday, 8.9.17 Patient is scheduled for CPX labs, please order future labs, Thanks , [name]

## 2016-01-19 ENCOUNTER — Other Ambulatory Visit (INDEPENDENT_AMBULATORY_CARE_PROVIDER_SITE_OTHER): Payer: 59

## 2016-01-19 DIAGNOSIS — E7800 Pure hypercholesterolemia, unspecified: Secondary | ICD-10-CM | POA: Diagnosis not present

## 2016-01-19 LAB — COMPREHENSIVE METABOLIC PANEL
ALBUMIN: 4.4 g/dL (ref 3.5–5.2)
ALT: 14 U/L (ref 0–35)
AST: 15 U/L (ref 0–37)
Alkaline Phosphatase: 77 U/L (ref 39–117)
BILIRUBIN TOTAL: 0.5 mg/dL (ref 0.2–1.2)
BUN: 14 mg/dL (ref 6–23)
CALCIUM: 9.7 mg/dL (ref 8.4–10.5)
CO2: 34 mEq/L — ABNORMAL HIGH (ref 19–32)
CREATININE: 0.85 mg/dL (ref 0.40–1.20)
Chloride: 103 mEq/L (ref 96–112)
GFR: 74.95 mL/min (ref >60.00)
Glucose, Bld: 94 mg/dL (ref 70–99)
Potassium: 4.4 mEq/L (ref 3.5–5.1)
Sodium: 139 mEq/L (ref 135–145)
Total Protein: 7.4 g/dL (ref 6.0–8.3)

## 2016-01-19 LAB — LIPID PANEL
CHOL/HDL RATIO: 4
CHOLESTEROL: 216 mg/dL — AB (ref 0–200)
HDL: 53.6 mg/dL (ref >39.00)
LDL Cholesterol: 138 mg/dL — ABNORMAL HIGH (ref 0–99)
NonHDL: 162.67
Triglycerides: 125 mg/dL (ref 0.0–149.0)
VLDL: 25 mg/dL (ref 0.0–40.0)

## 2016-01-20 ENCOUNTER — Other Ambulatory Visit (HOSPITAL_COMMUNITY): Payer: 59

## 2016-01-20 ENCOUNTER — Ambulatory Visit (HOSPITAL_COMMUNITY): Payer: 59 | Attending: Family Medicine

## 2016-01-20 ENCOUNTER — Ambulatory Visit (HOSPITAL_BASED_OUTPATIENT_CLINIC_OR_DEPARTMENT_OTHER): Payer: 59

## 2016-01-20 ENCOUNTER — Encounter: Payer: Self-pay | Admitting: *Deleted

## 2016-01-20 DIAGNOSIS — R079 Chest pain, unspecified: Secondary | ICD-10-CM | POA: Diagnosis not present

## 2016-01-20 DIAGNOSIS — E7800 Pure hypercholesterolemia, unspecified: Secondary | ICD-10-CM | POA: Insufficient documentation

## 2016-03-20 ENCOUNTER — Other Ambulatory Visit: Payer: Self-pay | Admitting: Family Medicine

## 2016-03-22 MED FILL — WELCHOL 625 MG TABLET: 625 | 90 days supply | Qty: 540 | Fill #0

## 2016-04-27 DIAGNOSIS — H5213 Myopia, bilateral: Secondary | ICD-10-CM | POA: Diagnosis not present

## 2016-04-27 DIAGNOSIS — H524 Presbyopia: Secondary | ICD-10-CM | POA: Diagnosis not present

## 2016-04-27 DIAGNOSIS — H52223 Regular astigmatism, bilateral: Secondary | ICD-10-CM | POA: Diagnosis not present

## 2016-05-18 ENCOUNTER — Encounter: Payer: Self-pay | Admitting: Family Medicine

## 2016-05-18 DIAGNOSIS — Z1231 Encounter for screening mammogram for malignant neoplasm of breast: Secondary | ICD-10-CM | POA: Diagnosis not present

## 2016-05-31 ENCOUNTER — Other Ambulatory Visit (INDEPENDENT_AMBULATORY_CARE_PROVIDER_SITE_OTHER): Payer: 59

## 2016-05-31 DIAGNOSIS — Z1211 Encounter for screening for malignant neoplasm of colon: Secondary | ICD-10-CM | POA: Diagnosis not present

## 2016-05-31 LAB — FECAL OCCULT BLOOD, IMMUNOCHEMICAL: Fecal Occult Bld: NEGATIVE

## 2016-08-25 ENCOUNTER — Encounter: Payer: Self-pay | Admitting: Family Medicine

## 2016-08-25 ENCOUNTER — Telehealth: Payer: Self-pay | Admitting: Family Medicine

## 2016-08-25 ENCOUNTER — Ambulatory Visit (INDEPENDENT_AMBULATORY_CARE_PROVIDER_SITE_OTHER): Payer: 59 | Admitting: Family Medicine

## 2016-08-25 VITALS — BP 118/80 | HR 96 | Temp 98.2°F | Wt 152.0 lb

## 2016-08-25 DIAGNOSIS — S39011A Strain of muscle, fascia and tendon of abdomen, initial encounter: Secondary | ICD-10-CM | POA: Diagnosis not present

## 2016-08-25 NOTE — Progress Notes (Signed)
RLQ/groin pain.  Woke up with pain yesterday, when rolling over.   No FCNAV.   She didn't know if it was a pulled muscle.  No pain at rest now.  Pain with laying down and then sitting back up, as she is sitting back up.  Pain with sneezing.  No trauma.  She had gone rock wall climbing last week.  She was sore the next few days but that got better and this is different.  No blood in stools.  Appetite is good.    She had some GI upset recently with some diarrhea- this happens occ and she thought she could have IBS (she didn't mention formal dx).  She didn't know if that was related, but that seems to be an ongoing intermittent issue for her.  Meds, vitals, and allergies reviewed.   ROS: Per HPI unless specifically indicated in ROS section   nad ncat Neck supple, no LA rrr ctab abd soft, nontender palpation at rest. Normal bowel sounds. No rebound. She does have pain when she moves from lying down to sitting back up, but this is improved when she presses on the sore area when she is getting up.  No mass or bulge felt locally. No rash No edema

## 2016-08-25 NOTE — Patient Instructions (Signed)
Press on the sore area as needed to reinforce the muscles.  Relative rest.  Update [place] as needed.   Take care.  Glad to see you.

## 2016-08-25 NOTE — Telephone Encounter (Signed)
[facility]  Patient Name: Joyce Brock  DOB: 1965-06-30    Initial Comment pain in lower right side of abdomen, close to the groin area, deep pain feel, but not painful to the touch, hard to sit up   Nurse Assessment  Nurse: Wynetta Emery, RN, Baker Janus Date/Time Eilene Ghazi Time): 08/25/2016 9:42:13 AM  Confirm and document reason for call. If symptomatic, describe symptoms. ---[name] woke up with pain in right lower abdomen near groin area feels like it is muscular pain and radiates across stomach onset yesterday  Does the patient have any new or worsening symptoms? ---Yes  Will a triage be completed? ---Yes  Related visit to physician within the last 2 weeks? ---No  Does the PT have any chronic conditions? (i.e. diabetes, asthma, etc.) ---No  Is the patient pregnant or possibly pregnant? (Ask all females between the ages of 44-55) ---No  Is this a behavioral health or substance abuse call? ---No     Guidelines    Guideline Title Affirmed Question Affirmed Notes  Abdominal Pain - Female [1] MODERATE (e.g., interferes with normal activities) AND [2] pain comes and goes (cramps) AND [3] present > 24 hours (Exception: pain with Vomiting or Diarrhea - see that Guideline)    Final Disposition User   See Physician within 24 Hours [place], RN, Baker Janus    Comments  NOTE no available appts with Dr. Diona Browner; Dr. Damita Dunnings has appt of 300pm 08/25/2016 for right lower abd pain x 2 days in groin area intermittent but goes from 5 to 10 with movement.   Referrals  REFERRED TO PCP OFFICE   Disagree/Comply: Comply

## 2016-08-27 DIAGNOSIS — S39011A Strain of muscle, fascia and tendon of abdomen, initial encounter: Secondary | ICD-10-CM | POA: Insufficient documentation

## 2016-08-27 NOTE — Assessment & Plan Note (Signed)
Likely an the abdominal wall strain. I don't think there is concerning intra-abdominal pathology. No hernia that I can feel. She clearly has no pain at rest. The pain that she does have when she sits up is improved with local compression. Discussed with patient. Relative rest. Update [place] as needed. She agrees.

## 2016-09-18 MED FILL — WELCHOL 625 MG TABLET: 625 | 90 days supply | Qty: 540 | Fill #1

## 2016-10-03 DIAGNOSIS — J309 Allergic rhinitis, unspecified: Secondary | ICD-10-CM | POA: Diagnosis not present

## 2016-10-03 DIAGNOSIS — H1045 Other chronic allergic conjunctivitis: Secondary | ICD-10-CM | POA: Diagnosis not present

## 2016-10-03 DIAGNOSIS — J301 Allergic rhinitis due to pollen: Secondary | ICD-10-CM | POA: Diagnosis not present

## 2016-10-03 DIAGNOSIS — R21 Rash and other nonspecific skin eruption: Secondary | ICD-10-CM | POA: Diagnosis not present

## 2016-10-03 MED FILL — AZELASTINE HCL 0.05% DROPS: 0.05 | 30 days supply | Qty: 6 | Fill #0

## 2016-10-03 MED FILL — FLUTICASONE PROP 50 MCG SPR: 50 | 30 days supply | Qty: 16 | Fill #0

## 2016-10-11 DIAGNOSIS — H01114 Allergic dermatitis of left upper eyelid: Secondary | ICD-10-CM | POA: Diagnosis not present

## 2016-10-11 MED FILL — DESONIDE 0.05% CREAM: 0.05 | 7 days supply | Qty: 15 | Fill #0

## 2017-01-11 ENCOUNTER — Other Ambulatory Visit (INDEPENDENT_AMBULATORY_CARE_PROVIDER_SITE_OTHER): Payer: 59

## 2017-01-11 ENCOUNTER — Telehealth: Payer: Self-pay | Admitting: Family Medicine

## 2017-01-11 DIAGNOSIS — E7800 Pure hypercholesterolemia, unspecified: Secondary | ICD-10-CM

## 2017-01-11 LAB — COMPREHENSIVE METABOLIC PANEL
ALT: 18 U/L (ref 0–35)
AST: 18 U/L (ref 0–37)
Albumin: 4.2 g/dL (ref 3.5–5.2)
Alkaline Phosphatase: 75 U/L (ref 39–117)
BUN: 11 mg/dL (ref 6–23)
CALCIUM: 9.6 mg/dL (ref 8.4–10.5)
CHLORIDE: 103 meq/L (ref 96–112)
CO2: 31 meq/L (ref 19–32)
CREATININE: 0.83 mg/dL (ref 0.40–1.20)
GFR: 76.74 mL/min (ref >60.00)
Glucose, Bld: 93 mg/dL (ref 70–99)
Potassium: 4.5 mEq/L (ref 3.5–5.1)
Sodium: 140 mEq/L (ref 135–145)
Total Bilirubin: 0.6 mg/dL (ref 0.2–1.2)
Total Protein: 7.3 g/dL (ref 6.0–8.3)

## 2017-01-11 LAB — LIPID PANEL
CHOL/HDL RATIO: 5
Cholesterol: 251 mg/dL — ABNORMAL HIGH (ref 0–200)
HDL: 55.8 mg/dL (ref >39.00)
LDL Cholesterol: 169 mg/dL — ABNORMAL HIGH (ref 0–99)
NonHDL: 195.63
TRIGLYCERIDES: 133 mg/dL (ref 0.0–149.0)
VLDL: 26.6 mg/dL (ref 0.0–40.0)

## 2017-01-11 NOTE — Telephone Encounter (Signed)
-----   Message from [name] sent at 01/10/2017  5:38 PM EDT ----- Regarding: Lab orders for Tuesday, 8.2.18 Patient is scheduled for CPX labs, please order future labs, Thanks , [name]

## 2017-01-12 ENCOUNTER — Telehealth: Payer: Self-pay | Admitting: Family Medicine

## 2017-01-12 ENCOUNTER — Other Ambulatory Visit: Payer: 59

## 2017-01-12 DIAGNOSIS — E7800 Pure hypercholesterolemia, unspecified: Secondary | ICD-10-CM

## 2017-01-12 NOTE — Telephone Encounter (Signed)
-----   Message from Marchia Bond sent at 01/03/2017 11:04 AM EDT ----- Regarding: Cpx labs Fri 8/3, need orders. Thanks :-) Please order  future cpx labs for pt's upcoming lab appt. Thanks Aniceto Boss

## 2017-01-18 ENCOUNTER — Ambulatory Visit (INDEPENDENT_AMBULATORY_CARE_PROVIDER_SITE_OTHER): Payer: 59 | Admitting: Family Medicine

## 2017-01-18 ENCOUNTER — Encounter: Payer: Self-pay | Admitting: Family Medicine

## 2017-01-18 VITALS — BP 102/70 | HR 105 | Temp 98.9°F | Ht 66.5 in | Wt 159.0 lb

## 2017-01-18 DIAGNOSIS — L209 Atopic dermatitis, unspecified: Secondary | ICD-10-CM | POA: Diagnosis not present

## 2017-01-18 DIAGNOSIS — Z1211 Encounter for screening for malignant neoplasm of colon: Secondary | ICD-10-CM

## 2017-01-18 DIAGNOSIS — Z0000 Encounter for general adult medical examination without abnormal findings: Secondary | ICD-10-CM

## 2017-01-18 DIAGNOSIS — E7800 Pure hypercholesterolemia, unspecified: Secondary | ICD-10-CM

## 2017-01-18 MED ORDER — TRIAMCINOLONE ACETONIDE 0.1 % EX CREA: 1.0000 "application " | TOPICAL_CREAM | Freq: Two times a day (BID) | CUTANEOUS | 0 refills | Status: DC

## 2017-01-18 MED ORDER — ATORVASTATIN CALCIUM 10 MG PO TABS: 10.0000 mg | ORAL_TABLET | Freq: Every day | ORAL | 11 refills | Status: DC

## 2017-01-18 MED FILL — TRIAMCINOLONE 0.1% CREAM: 0.1 | 5 days supply | Qty: 15 | Fill #0

## 2017-01-18 MED FILL — ATORVASTATIN 10 MG TABLET: 10 | 30 days supply | Qty: 30 | Fill #0

## 2017-01-18 NOTE — Assessment & Plan Note (Signed)
Treat with topical steroid cream.. Sent in stronger topical steroid for flares.  MAde recs on soap and  Moisturizing as well.

## 2017-01-18 NOTE — Assessment & Plan Note (Signed)
Pt open to retrying statin .Marland Kitchen Start lipitor low dose. Stop red yeast rice and welchol.

## 2017-01-18 NOTE — Progress Notes (Signed)
Subjective:    Patient ID: Joyce Brock, female    DOB: 25-Aug-1965, 51 y.o.   MRN: [id number]  HPI  The patient is here for annual wellness exam and preventative care.     She has had an issue with red patches on eyelid, under nose on upper lip, lower lip.Marrian Salvage, buring.  Changed from claritin to ceterizine.  Eye MD started her on desonide cream on eyelids. Gets better but returns when using makeup.  As a child had eczema.  Elevated Cholesterol: Inadequate control on  welchol and red yeast rice. intermittently Was slightly achy with lipitor in past. Lab Results  Component Value Date   CHOL 251 (H) 01/11/2017   HDL 55.80 01/11/2017   LDLCALC 169 (H) 01/11/2017   LDLDIRECT 168.7 03/22/2012   TRIG 133.0 01/11/2017   CHOLHDL 5 01/11/2017   Diet compliance: good Exercise: minimal Other complaints:   Body mass index is 25.28 kg/m. Wt Readings from Last 3 Encounters:  01/18/17 159 lb (72.1 kg)  08/25/16 152 lb (68.9 kg)  01/13/16 150 lb 4 oz (68.2 kg)     Social History /Family History/Past Medical History reviewed in detail and updated in EMR if needed. Blood pressure 102/70, pulse (!) 105, temperature 98.9 F (37.2 C), temperature source Oral, height 5' 6.5" (1.689 m), weight 159 lb (72.1 kg), last menstrual period 03/23/2013.  Review of Systems  Constitutional: Negative for fatigue and fever.  HENT: Negative for congestion.   Eyes: Negative for pain.  Respiratory: Negative for cough and shortness of breath.   Cardiovascular: Negative for chest pain, palpitations and leg swelling.  Gastrointestinal: Negative for abdominal pain.  Genitourinary: Negative for dysuria and vaginal bleeding.  Musculoskeletal: Negative for back pain.  Neurological: Negative for syncope, light-headedness and headaches.  Psychiatric/Behavioral: Negative for dysphoric mood.       Objective:   Physical Exam  Constitutional: Vital signs are normal. She appears well-developed and  well-nourished. She is cooperative.  Non-toxic appearance. She does not appear ill. No distress.  HENT:  Head: Normocephalic.  Right Ear: Hearing, tympanic membrane, external ear and ear canal normal.  Left Ear: Hearing, tympanic membrane, external ear and ear canal normal.  Nose: Nose normal.  Eyes: Pupils are equal, round, and reactive to light. Conjunctivae, EOM and lids are normal. Lids are everted and swept, no foreign bodies found.  Neck: Trachea normal and normal range of motion. Neck supple. Carotid bruit is not present. No thyroid mass and no thyromegaly present.  Cardiovascular: Normal rate, regular rhythm, S1 normal, S2 normal, normal heart sounds and intact distal pulses.  Exam reveals no gallop.   No murmur heard. Pulmonary/Chest: Effort normal and breath sounds normal. No respiratory distress. She has no wheezes. She has no rhonchi. She has no rales.  Abdominal: Soft. Normal appearance and bowel sounds are normal. She exhibits no distension, no fluid wave, no abdominal bruit and no mass. There is no hepatosplenomegaly. There is no tenderness. There is no rebound, no guarding and no CVA tenderness. No hernia.  Lymphadenopathy:    She has no cervical adenopathy.    She has no axillary adenopathy.  Neurological: She is alert. She has normal strength. No cranial nerve deficit or sensory deficit.  Skin: Skin is warm, dry and intact. No rash noted.  erythematous macules with dry flaky skin in eyelids, upper lip and lower lip, eye lid creases  Psychiatric: Her speech is normal and behavior is normal. Judgment normal. Her mood appears not anxious.  Cognition and memory are normal. She does not exhibit a depressed mood.          Assessment & Plan:  The patient's preventative maintenance and recommended screening tests for an annual wellness exam were reviewed in full today. Brought up to date unless services declined.  Counselled on the importance of diet, exercise, and its role in  overall health and mortality. The patient's FH and SH was reviewed, including their home life, tobacco status, and drug and alcohol status.   Vaccines: uptodate Mammo: 05/2016 nml, sister with breast cancer. BRCA1 and BRCA 2 neg. Smoking:no STD screen/ HIV : refused. PAP/DVE: Last nml 2016 neg co testing, repeat in 5 years,  No  DVE indicated Colon: IFOb yearly.

## 2017-01-18 NOTE — Patient Instructions (Addendum)
Change to hypoallergenic cleaning products.  Change to cetaphil wsh and cream.  Don't over wash skin on face.  Apply topical steroid cream twice daily.  Get back on track with healthy diet and regualr exercise.  Stop at lab on way out for colon cancer screening ifob.

## 2017-04-16 MED FILL — ATORVASTATIN 10 MG TABLET: 10 | 30 days supply | Qty: 30 | Fill #1

## 2017-04-20 ENCOUNTER — Other Ambulatory Visit (INDEPENDENT_AMBULATORY_CARE_PROVIDER_SITE_OTHER): Payer: 59

## 2017-04-20 DIAGNOSIS — E785 Hyperlipidemia, unspecified: Secondary | ICD-10-CM

## 2017-04-20 LAB — COMPREHENSIVE METABOLIC PANEL
ALK PHOS: 74 U/L (ref 39–117)
ALT: 21 U/L (ref 0–35)
AST: 16 U/L (ref 0–37)
Albumin: 4.3 g/dL (ref 3.5–5.2)
BUN: 12 mg/dL (ref 6–23)
CALCIUM: 10 mg/dL (ref 8.4–10.5)
CHLORIDE: 102 meq/L (ref 96–112)
CO2: 32 meq/L (ref 19–32)
CREATININE: 0.87 mg/dL (ref 0.40–1.20)
GFR: 72.61 mL/min (ref >60.00)
GLUCOSE: 99 mg/dL (ref 70–99)
POTASSIUM: 4 meq/L (ref 3.5–5.1)
Sodium: 140 mEq/L (ref 135–145)
Total Bilirubin: 0.5 mg/dL (ref 0.2–1.2)
Total Protein: 7.3 g/dL (ref 6.0–8.3)

## 2017-04-20 LAB — LIPID PANEL
CHOL/HDL RATIO: 4
Cholesterol: 207 mg/dL — ABNORMAL HIGH (ref 0–200)
HDL: 55.6 mg/dL (ref >39.00)
LDL CALC: 131 mg/dL — AB (ref 0–99)
NONHDL: 151.26
TRIGLYCERIDES: 102 mg/dL (ref 0.0–149.0)
VLDL: 20.4 mg/dL (ref 0.0–40.0)

## 2017-04-23 MED FILL — FLUTICASONE PROP 50 MCG SPR: 50 | 30 days supply | Qty: 16 | Fill #1

## 2017-05-29 DIAGNOSIS — Z803 Family history of malignant neoplasm of breast: Secondary | ICD-10-CM | POA: Diagnosis not present

## 2017-05-29 DIAGNOSIS — Z1231 Encounter for screening mammogram for malignant neoplasm of breast: Secondary | ICD-10-CM | POA: Diagnosis not present

## 2017-06-12 HISTORY — PX: COLONOSCOPY: SHX174

## 2017-07-04 MED FILL — ATORVASTATIN 10 MG TABLET: 10 | 30 days supply | Qty: 30 | Fill #2

## 2017-09-19 MED FILL — ATORVASTATIN 10 MG TABLET: 10 | 30 days supply | Qty: 30 | Fill #3

## 2017-12-11 MED FILL — ATORVASTATIN 10 MG TABLET: 10 | 30 days supply | Qty: 30 | Fill #4

## 2018-01-25 ENCOUNTER — Other Ambulatory Visit (INDEPENDENT_AMBULATORY_CARE_PROVIDER_SITE_OTHER): Payer: 59

## 2018-01-25 ENCOUNTER — Telehealth: Payer: Self-pay | Admitting: Family Medicine

## 2018-01-25 DIAGNOSIS — E7800 Pure hypercholesterolemia, unspecified: Secondary | ICD-10-CM | POA: Diagnosis not present

## 2018-01-25 LAB — LIPID PANEL
CHOL/HDL RATIO: 4
CHOLESTEROL: 192 mg/dL (ref 0–200)
HDL: 50.2 mg/dL (ref >39.00)
LDL CALC: 114 mg/dL — AB (ref 0–99)
NONHDL: 141.58
Triglycerides: 140 mg/dL (ref 0.0–149.0)
VLDL: 28 mg/dL (ref 0.0–40.0)

## 2018-01-25 LAB — COMPREHENSIVE METABOLIC PANEL
ALBUMIN: 4.4 g/dL (ref 3.5–5.2)
ALT: 20 U/L (ref 0–35)
AST: 18 U/L (ref 0–37)
Alkaline Phosphatase: 71 U/L (ref 39–117)
BUN: 10 mg/dL (ref 6–23)
CHLORIDE: 102 meq/L (ref 96–112)
CO2: 34 meq/L — AB (ref 19–32)
CREATININE: 0.94 mg/dL (ref 0.40–1.20)
Calcium: 10.2 mg/dL (ref 8.4–10.5)
GFR: 66.21 mL/min (ref >60.00)
GLUCOSE: 97 mg/dL (ref 70–99)
Potassium: 4.3 mEq/L (ref 3.5–5.1)
SODIUM: 141 meq/L (ref 135–145)
Total Bilirubin: 0.8 mg/dL (ref 0.2–1.2)
Total Protein: 7 g/dL (ref 6.0–8.3)

## 2018-01-25 NOTE — Telephone Encounter (Signed)
-----   Message from [name] sent at 01/15/2018  9:10 AM EDT ----- Regarding: Lab orders for Friday, 5.16.19 Patient is scheduled for CPX labs, please order future labs, Thanks , [name]

## 2018-01-31 ENCOUNTER — Encounter: Payer: Self-pay | Admitting: Family Medicine

## 2018-01-31 ENCOUNTER — Encounter: Payer: Self-pay | Admitting: Gastroenterology

## 2018-01-31 ENCOUNTER — Ambulatory Visit (INDEPENDENT_AMBULATORY_CARE_PROVIDER_SITE_OTHER): Payer: 59 | Admitting: Family Medicine

## 2018-01-31 VITALS — BP 100/70 | HR 88 | Temp 98.6°F | Ht 66.5 in | Wt 159.0 lb

## 2018-01-31 DIAGNOSIS — Z1211 Encounter for screening for malignant neoplasm of colon: Secondary | ICD-10-CM

## 2018-01-31 DIAGNOSIS — E7800 Pure hypercholesterolemia, unspecified: Secondary | ICD-10-CM | POA: Diagnosis not present

## 2018-01-31 DIAGNOSIS — Z0000 Encounter for general adult medical examination without abnormal findings: Secondary | ICD-10-CM | POA: Diagnosis not present

## 2018-01-31 NOTE — Patient Instructions (Addendum)
Get back to regualr exercsie and low fat and low carb diet. Please stop at the front desk to set up referral.  Get flu vaccine!

## 2018-01-31 NOTE — Assessment & Plan Note (Signed)
Well controlled. Continue current medication.  

## 2018-01-31 NOTE — Progress Notes (Signed)
Subjective:    Patient ID: Joyce Brock, female    DOB: 02-14-1966, 52 y.o.   MRN: [id number]  HPI The patient is here for annual wellness exam and preventative care.     She has noted  Off and on swelling in hands.. ? Retaining fluid. Going on in last year.  Ring does not fit as well.   No swelling in ankles.  Watches salt intake, no new meds.  No joint pain, no redness. Wt Readings from Last 3 Encounters:  01/31/18 159 lb (72.1 kg)  01/18/17 159 lb (72.1 kg)  08/25/16 152 lb (68.9 kg)    Elevated Cholesterol:  She is using lipitor  5 mg every day. Lab Results  Component Value Date   CHOL 192 01/25/2018   HDL 50.20 01/25/2018   LDLCALC 114 (H) 01/25/2018   LDLDIRECT 168.7 03/22/2012   TRIG 140.0 01/25/2018   CHOLHDL 4 01/25/2018  Using medications without problems: Muscle aches:  Diet compliance: Off and on weight watches..  trying to work on healthier eating. Exercise: Water aerobics.  Other complaints: Occ lightheaded, random, not with standing.  She is going through menopause. No current nights sweats.   Social History /Family History/Past Medical History reviewed in detail and updated in EMR if needed. Blood pressure 100/70, pulse 88, temperature 98.6 F (37 C), temperature source Oral, height 5' 6.5" (1.689 m), weight 159 lb (72.1 kg), last menstrual period 03/23/2013.  Review of Systems  Constitutional: Negative for fatigue and fever.  HENT: Negative for congestion.   Eyes: Negative for pain.  Respiratory: Negative for cough and shortness of breath.   Cardiovascular: Negative for chest pain, palpitations and leg swelling.  Gastrointestinal: Negative for abdominal pain.  Genitourinary: Negative for dysuria and vaginal bleeding.  Musculoskeletal: Negative for back pain.  Neurological: Negative for syncope, light-headedness and headaches.  Psychiatric/Behavioral: Negative for dysphoric mood.       Objective:   Physical Exam  Constitutional: Vital  signs are normal. She appears well-developed and well-nourished. She is cooperative.  Non-toxic appearance. She does not appear ill. No distress.  HENT:  Head: Normocephalic.  Right Ear: Hearing, tympanic membrane, external ear and ear canal normal. Tympanic membrane is not erythematous, not retracted and not bulging.  Left Ear: Hearing, tympanic membrane, external ear and ear canal normal. Tympanic membrane is not erythematous, not retracted and not bulging.  Nose: No mucosal edema or rhinorrhea. Right sinus exhibits no maxillary sinus tenderness and no frontal sinus tenderness. Left sinus exhibits no maxillary sinus tenderness and no frontal sinus tenderness.  Mouth/Throat: Uvula is midline, oropharynx is clear and moist and mucous membranes are normal.  Eyes: Pupils are equal, round, and reactive to light. Conjunctivae, EOM and lids are normal. Lids are everted and swept, no foreign bodies found.  Neck: Trachea normal and normal range of motion. Neck supple. Carotid bruit is not present. No thyroid mass and no thyromegaly present.  Cardiovascular: Normal rate, regular rhythm, S1 normal, S2 normal, normal heart sounds, intact distal pulses and normal pulses. Exam reveals no gallop and no friction rub.  No murmur heard. Pulmonary/Chest: Effort normal and breath sounds normal. No tachypnea. No respiratory distress. She has no decreased breath sounds. She has no wheezes. She has no rhonchi. She has no rales.  Abdominal: Soft. Normal appearance and bowel sounds are normal. There is no tenderness.  Neurological: She is alert.  Skin: Skin is warm, dry and intact. No rash noted.  Psychiatric: Her speech is normal and  behavior is normal. Judgment and thought content normal. Her mood appears not anxious. Cognition and memory are normal. She does not exhibit a depressed mood.          Assessment & Plan:  The patient's preventative maintenance and recommended screening tests for an annual wellness exam  were reviewed in full today. Brought up to date unless services declined.  Counselled on the importance of diet, exercise, and its role in overall health and mortality. The patient's FH and SH was reviewed, including their home life, tobacco status, and drug and alcohol status.   Vaccines: uptodate Mammo: 12/2018nml, sister with breast cancer. BRCA1 and BRCA 2 neg. Smoking:no STD screen/ HIV : refused. PAP/DVE: Last nml 2016 neg co testing, repeat in 5 years,  No  DVE indicated, asymptomatic. Colon: IFOb yearly. Never returned last year. Will plam colonoscopy.

## 2018-02-14 ENCOUNTER — Other Ambulatory Visit: Payer: Self-pay | Admitting: Family Medicine

## 2018-02-15 MED FILL — ATORVASTATIN 10 MG TABLET: 10 | 90 days supply | Qty: 90 | Fill #0

## 2018-03-28 ENCOUNTER — Ambulatory Visit (AMBULATORY_SURGERY_CENTER): Payer: Self-pay

## 2018-03-28 ENCOUNTER — Encounter: Payer: Self-pay | Admitting: Gastroenterology

## 2018-03-28 VITALS — Ht 67.0 in | Wt 162.0 lb

## 2018-03-28 DIAGNOSIS — Z1211 Encounter for screening for malignant neoplasm of colon: Secondary | ICD-10-CM

## 2018-03-28 MED ORDER — NA SULFATE-K SULFATE-MG SULF 17.5-3.13-1.6 GM/177ML PO SOLN: 1.0000 | Freq: Once | ORAL | 0 refills | Status: AC

## 2018-03-28 MED FILL — SUPREP BOWEL PREP KIT: 17.5-3.13-1 | 2 days supply | Qty: 354 | Fill #0

## 2018-03-28 NOTE — Progress Notes (Signed)
Denies allergies to eggs or soy products. Denies complication of anesthesia or sedation. Denies use of weight loss medication. Denies use of O2.   Emmi instructions declined.  

## 2018-04-04 ENCOUNTER — Ambulatory Visit (AMBULATORY_SURGERY_CENTER): Payer: 59 | Admitting: Gastroenterology

## 2018-04-04 ENCOUNTER — Encounter: Payer: Self-pay | Admitting: Gastroenterology

## 2018-04-04 VITALS — BP 115/88 | HR 79 | Temp 99.1°F | Resp 12 | Ht 67.0 in | Wt 162.0 lb

## 2018-04-04 DIAGNOSIS — D122 Benign neoplasm of ascending colon: Secondary | ICD-10-CM

## 2018-04-04 DIAGNOSIS — Z86010 Personal history of colonic polyps: Secondary | ICD-10-CM | POA: Diagnosis not present

## 2018-04-04 DIAGNOSIS — D120 Benign neoplasm of cecum: Secondary | ICD-10-CM

## 2018-04-04 DIAGNOSIS — Z1211 Encounter for screening for malignant neoplasm of colon: Secondary | ICD-10-CM | POA: Diagnosis not present

## 2018-04-04 DIAGNOSIS — E7800 Pure hypercholesterolemia, unspecified: Secondary | ICD-10-CM | POA: Diagnosis not present

## 2018-04-04 MED ORDER — SODIUM CHLORIDE 0.9 % IV SOLN: 500.0000 mL | Freq: Once | INTRAVENOUS | Status: DC

## 2018-04-04 NOTE — Op Note (Signed)
[facility] Patient Name: Joyce Brock Procedure Date: 04/04/2018 10:26 AM MRN: [id number] Endoscopist: Ladene Artist , MD Age: 52 Referring MD:  Date of Birth: [birth year] Gender: Female Account #: [id number] Procedure:                Colonoscopy Indications:              Screening for colorectal malignant neoplasm Medicines:                Monitored Anesthesia Care Procedure:                Pre-Anesthesia Assessment:                           - Prior to the procedure, a History and Physical                            was performed, and patient medications and                            allergies were reviewed. The patient's tolerance of                            previous anesthesia was also reviewed. The risks                            and benefits of the procedure and the sedation                            options and risks were discussed with the patient.                            All questions were answered, and informed consent                            was obtained. Prior Anticoagulants: The patient has                            taken no previous anticoagulant or antiplatelet                            agents. ASA Grade Assessment: II - A patient with                            mild systemic disease. After reviewing the risks                            and benefits, the patient was deemed in                            satisfactory condition to undergo the procedure.                           After obtaining informed consent, the colonoscope  was passed under direct vision. Throughout the                            procedure, the patient's blood pressure, pulse, and                            oxygen saturations were monitored continuously. The                            Model PCF-H190DL [id number]) scope was introduced                            through the anus and advanced to the the cecum,                            identified  by appendiceal orifice and ileocecal                            valve. The ileocecal valve, appendiceal orifice,                            and rectum were photographed. The quality of the                            bowel preparation was good after extensive lavage                            and suctioning. The colonoscopy was performed                            without difficulty. The patient tolerated the                            procedure well. Scope In: 10:32:00 AM Scope Out: 10:46:26 AM Scope Withdrawal Time: 0 hours 12 minutes 33 seconds  Total Procedure Duration: 0 hours 14 minutes 26 seconds  Findings:                 The perianal and digital rectal examinations were                            normal.                           A 7 mm polyp was found in the ascending colon. The                            polyp was sessile. The polyp was removed with a                            cold snare. Resection and retrieval were complete.                           Two sessile polyps were found in the cecum. The  polyps were 2 to 4 mm in size. These polyps were                            removed with a cold biopsy forceps. Resection and                            retrieval were complete.                           A single small localized angiodysplastic lesion                            without bleeding was found in the cecum.                           Multiple medium-mouthed diverticula were found in                            the left colon. There was no evidence of                            diverticular bleeding.                           Internal hemorrhoids were found during                            retroflexion. The hemorrhoids were medium-sized and                            Grade I (internal hemorrhoids that do not prolapse).                           The exam was otherwise without abnormality on                            direct and retroflexion  views. Complications:            No immediate complications. Estimated blood loss:                            None. Estimated Blood Loss:     Estimated blood loss: none. Impression:               - One 7 mm polyp in the ascending colon, removed                            with a cold snare. Resected and retrieved.                           - Two 2 to 4 mm polyps in the cecum, removed with a                            cold biopsy forceps. Resected and retrieved.                           -  Small cecal angiodysplasia.                           - Moderate diverticulosis in the left colon.                           - Internal hemorrhoids.                           - The examination was otherwise normal on direct                            and retroflexion views. Recommendation:           - Repeat colonoscopy in 5 years for surveillance if                            polyp(s) are precancerous, otherwise 10 years.                           - Patient has a contact number available for                            emergencies. The signs and symptoms of potential                            delayed complications were discussed with the                            patient. Return to normal activities tomorrow.                            Written discharge instructions were provided to the                            patient.                           - High fiber diet.                           - Continue present medications.                           - Await pathology results. Ladene Artist, MD 04/04/2018 10:54:03 AM This report has been signed electronically.

## 2018-04-04 NOTE — Progress Notes (Signed)
Pt's states no medical or surgical changes since previsit or office visit. 

## 2018-04-04 NOTE — Progress Notes (Signed)
Report given to PACU, vss 

## 2018-04-04 NOTE — Patient Instructions (Signed)
YOU HAD AN ENDOSCOPIC PROCEDURE TODAY AT THE [facility]:   Refer to the procedure report that was given to you for any specific questions about what was found during the examination.  If the procedure report does not answer your questions, please call your gastroenterologist to clarify.  If you requested that your care partner not be given the details of your procedure findings, then the procedure report has been included in a sealed envelope for you to review at your convenience later.  YOU SHOULD EXPECT: Some feelings of bloating in the abdomen. Passage of more gas than usual.  Walking can help get rid of the air that was put into your GI tract during the procedure and reduce the bloating. If you had a lower endoscopy (such as a colonoscopy or flexible sigmoidoscopy) you may notice spotting of blood in your stool or on the toilet paper. If you underwent a bowel prep for your procedure, you may not have a normal bowel movement for a few days.  Please Note:  You might notice some irritation and congestion in your nose or some drainage.  This is from the oxygen used during your procedure.  There is no need for concern and it should clear up in a day or so.  SYMPTOMS TO REPORT IMMEDIATELY:   Following lower endoscopy (colonoscopy or flexible sigmoidoscopy):  Excessive amounts of blood in the stool  Significant tenderness or worsening of abdominal pains  Swelling of the abdomen that is new, acute  Fever of 100F or higher   For urgent or emergent issues, a gastroenterologist can be reached at any hour by calling [phone].   DIET:  We do recommend a small meal at first, but then you may proceed to your regular diet.  Drink plenty of fluids but you should avoid alcoholic beverages for 24 hours. Try to increase the fiber in your diet, and drink plenty of water.  ACTIVITY:  You should plan to take it easy for the rest of today and you should NOT DRIVE or use heavy machinery until  tomorrow (because of the sedation medicines used during the test).    FOLLOW UP: Our staff will call the number listed on your records the next business day following your procedure to check on you and address any questions or concerns that you may have regarding the information given to you following your procedure. If we do not reach you, we will leave a message.  However, if you are feeling well and you are not experiencing any problems, there is no need to return our call.  We will assume that you have returned to your regular daily activities without incident.  If any biopsies were taken you will be contacted by phone or by letter within the next 1-3 weeks.  Please call us at [phone] if you have not heard about the biopsies in 3 weeks.    SIGNATURES/CONFIDENTIALITY: You and/or your care partner have signed paperwork which will be entered into your electronic medical record.  These signatures attest to the fact that that the information above on your After Visit Summary has been reviewed and is understood.  Full responsibility of the confidentiality of this discharge information lies with you and/or your care-partner.  Read all handouts given to you by your recovery room nurse. 

## 2018-04-04 NOTE — Progress Notes (Signed)
Called to room to assist during endoscopic procedure.  Patient ID and intended procedure confirmed with present staff. Received instructions for my participation in the procedure from the performing physician.  

## 2018-04-05 ENCOUNTER — Telehealth: Payer: Self-pay

## 2018-04-05 NOTE — Telephone Encounter (Signed)
  Follow up Call-  Call back number 04/04/2018  Post procedure Call Back phone  # [phone]  Permission to leave phone message Yes  Some recent data might be hidden     Patient questions:  Do you have a fever, pain , or abdominal swelling? No. Pain Score  0 *  Have you tolerated food without any problems? Yes.    Have you been able to return to your normal activities? Yes.    Do you have any questions about your discharge instructions: Diet   No. Medications  No. Follow up visit  No.  Do you have questions or concerns about your Care? No.  Actions: * If pain score is 4 or above: No action needed, pain <4.

## 2018-04-19 ENCOUNTER — Encounter: Payer: Self-pay | Admitting: Gastroenterology

## 2018-04-19 DIAGNOSIS — H524 Presbyopia: Secondary | ICD-10-CM | POA: Diagnosis not present

## 2018-09-12 ENCOUNTER — Telehealth: Payer: 59 | Admitting: Physician Assistant

## 2018-09-12 DIAGNOSIS — B349 Viral infection, unspecified: Secondary | ICD-10-CM

## 2018-09-12 NOTE — Progress Notes (Signed)
E-Visit for Corona Virus Screening  Based on your current symptoms, you may very well have the virus, however your symptoms are mild. Currently, not all patients are being tested. If the symptoms are mild and there is not a known exposure, performing the test is not indicated.  Coronavirus disease 2019 (COVID-19) is a respiratory illness that can spread from person to person. The virus that causes COVID-19 is a new virus that was first identified in the country of [place] but is now found in multiple other countries and has spread to the [place].  Symptoms associated with the virus are mild to severe fever, cough, and shortness of breath. There is currently no vaccine to protect against COVID-19, and there is no specific antiviral treatment for the virus.   To be considered HIGH RISK for Coronavirus (COVID-19), you have to meet the following criteria:  . Traveled to [place], [place], [place], [place] or [place]; or in the [place] to [place], [place], [place], or [place]; and have fever, cough, and shortness of breath within the last 2 weeks of travel OR  . Been in close contact with a person diagnosed with COVID-19 within the last 2 weeks and have fever, cough, and shortness of breath  . IF YOU DO NOT MEET THESE CRITERIA, YOU ARE CONSIDERED LOW RISK FOR COVID-19.   It is vitally important that if you feel that you have an infection such as this virus or any other virus that you stay home and away from places where you may spread it to others.  You should self-quarantine for 14 days if you have symptoms that could potentially be coronavirus and avoid contact with people age 106 and older.   You can use medication such as Delsym for cough.  You may also take acetaminophen (Tylenol) as needed for fever.  Avoid anti-inflammatories like Ibuprofen (Advil, Motrin), Naproxen (Aleve) at they can worsen stomach symptoms and increase virility of the Coronavirus if present.   Reduce your risk  of any infection by using the same precautions used for avoiding the common cold or flu:  Marland Kitchen Wash your hands often with soap and warm water for at least 20 seconds.  If soap and water are not readily available, use an alcohol-based hand sanitizer with at least 60% alcohol.  . If coughing or sneezing, cover your mouth and nose by coughing or sneezing into the elbow areas of your shirt or coat, into a tissue or into your sleeve (not your hands). . Avoid shaking hands with others and consider head nods or verbal greetings only. . Avoid touching your eyes, nose, or mouth with unwashed hands.  . Avoid close contact with people who are sick. . Avoid places or events with large numbers of people in one location, like concerts or sporting events. . Carefully consider travel plans you have or are making. . If you are planning any travel outside or inside the [place], visit the CDC's Travelers' Health webpage for the latest health notices. . If you have some symptoms but not all symptoms, continue to monitor at home and seek medical attention if your symptoms worsen. . If you are having a medical emergency, call 911.  HOME CARE . Only take medications as instructed by your medical team. . Drink plenty of fluids and get plenty of rest. . A steam or ultrasonic humidifier can help if you have congestion.   GET HELP RIGHT AWAY IF: . You develop worsening fever. . You become short  of breath . You cough up blood. . Your symptoms become more severe MAKE SURE YOU   Understand these instructions.  Will watch your condition.  Will get help right away if you are not doing well or get worse.  Your e-visit answers were reviewed by a board certified advanced clinical practitioner to complete your personal care plan.  Depending on the condition, your plan could have included both over the counter or prescription medications.  If there is a problem please reply once you have received a response from your  provider. Your safety is important to [place].  If you have drug allergies check your prescription carefully.    You can use MyChart to ask questions about today's visit, request a non-urgent call back, or ask for a work or school excuse for 24 hours related to this e-Visit. If it has been greater than 24 hours you will need to follow up with your provider, or enter a new e-Visit to address those concerns. You will get an e-mail in the next two days asking about your experience.  I hope that your e-visit has been valuable and will speed your recovery. Thank you for using e-visits.

## 2018-09-12 NOTE — Progress Notes (Signed)
I have spent 5 minutes in review of e-visit questionnaire, review and updating patient chart, medical decision making and response to patient.   Harrel Ferrone Cody Nishtha Raider, PA-C    

## 2018-09-20 MED FILL — ATORVASTATIN 10 MG TABLET: 10 | 90 days supply | Qty: 90 | Fill #1

## 2018-10-04 ENCOUNTER — Other Ambulatory Visit: Payer: Self-pay

## 2018-10-04 ENCOUNTER — Ambulatory Visit (INDEPENDENT_AMBULATORY_CARE_PROVIDER_SITE_OTHER): Payer: 59 | Admitting: Family Medicine

## 2018-10-04 ENCOUNTER — Encounter: Payer: Self-pay | Admitting: Family Medicine

## 2018-10-04 DIAGNOSIS — F411 Generalized anxiety disorder: Secondary | ICD-10-CM | POA: Insufficient documentation

## 2018-10-04 DIAGNOSIS — F410 Panic disorder [episodic paroxysmal anxiety] without agoraphobia: Secondary | ICD-10-CM | POA: Insufficient documentation

## 2018-10-04 DIAGNOSIS — R110 Nausea: Secondary | ICD-10-CM | POA: Insufficient documentation

## 2018-10-04 MED ORDER — HYDROXYZINE HCL 50 MG PO TABS: 50.0000 mg | ORAL_TABLET | Freq: Three times a day (TID) | ORAL | 0 refills | Status: DC | PRN

## 2018-10-04 NOTE — Progress Notes (Signed)
VIRTUAL VISIT Due to national recommendations of social distancing due to [place] 19, a virtual visit is felt to be most appropriate for this patient at this time.   I connected with the patient on 10/04/18 at  9:00 AM EDT by virtual telehealth platform and verified that I am speaking with the correct person using two identifiers.   I discussed the limitations, risks, security and privacy concerns of performing an evaluation and management service by  virtual telehealth platform and the availability of in person appointments. I also discussed with the patient that there may be a patient responsible charge related to this service. The patient expressed understanding and agreed to proceed.  Patient location: Home Provider Location: [facility] Participants: Eliezer Lofts and Rachell Cipro   Chief Complaint  Patient presents with  . Anxiety  . Nausea    History of Present Illness: 53 year old female patient presents with  anxiety and nausea.  Monday night.. she had palpitations, mild SOB, extreme anxiety, jittery, felt like panic attack. Occurred after son mentioned someone at work tested [employer] for [place] with relaxation. Since then over the week she reports that she has been very nauseous, decreased appetite, anxiety.   She has only eaten a small amount, soup etc.  Using pepto bismol.. did not help.   no abdominal pain, no fever, no blood in stool.  few times of diarrhea, no constipation. No emesis.  Feeling some burning in throat.   She has felt more generally anxious over the last 3 months.Marland Kitchen  GAD 7 : Generalized Anxiety Score 10/04/2018  Nervous, Anxious, on Edge 1  Control/stop worrying 1  Worry too much - different things 3  Trouble relaxing 3  Restless 3  Easily annoyed or irritable 1  Afraid - awful might happen 1  Total GAD 7 Score 13  Anxiety Difficulty Somewhat difficult   PHQ2:1 mild anhedonia   In past in 30s was on paxil for  anxiety.  COVID 19 screen No recent travel or known exposure to COVID19 The patient denies respiratory symptoms of COVID 19 at this time.  The importance of social distancing was discussed today.   Review of Systems  Constitutional: Negative for chills and fever.  HENT: Negative for congestion and ear pain.   Eyes: Negative for pain and redness.  Respiratory: Negative for cough and shortness of breath.   Cardiovascular: Positive for palpitations. Negative for chest pain and leg swelling.  Gastrointestinal: Positive for abdominal pain, diarrhea and nausea. Negative for blood in stool, constipation and vomiting.  Genitourinary: Negative for dysuria.  Musculoskeletal: Negative for falls and myalgias.  Skin: Negative for rash.  Neurological: Negative for dizziness.  Psychiatric/Behavioral: Negative for depression, hallucinations, memory loss, substance abuse and suicidal ideas. The patient is nervous/anxious.       Past Medical History:  Diagnosis Date  . Allergy   . Hyperlipidemia     reports that she has quit smoking. She has never used smokeless tobacco. She reports that she does not drink alcohol or use drugs.   Current Outpatient Medications:  .  acetaminophen (TYLENOL) 500 MG tablet, Take 1,000 mg by mouth every 6 (six) hours as needed., Disp: , Rfl:  .  atorvastatin (LIPITOR) 10 MG tablet, TAKE 1 TABLET (10 MG TOTAL) BY MOUTH DAILY., Disp: 90 tablet, Rfl: 3 .  Calcium Carbonate-Vitamin D (CALCIUM 600+D) 600-400 MG-UNIT tablet, Take 2 tablets by mouth daily., Disp: , Rfl:  .  cetirizine (ALLERGY 24HOUR INDOOR/OUTDOOR) 10 MG  tablet, , Disp: , Rfl:  .  fluticasone (FLONASE) 50 MCG/ACT nasal spray, , Disp: , Rfl:  .  vitamin B-12 (CYANOCOBALAMIN) 1000 MCG tablet, Take 1,000 mcg by mouth daily., Disp: , Rfl:    Observations/Objective: Last menstrual period 03/23/2013.  Physical Exam  Physical Exam Constitutional:      General: She is not in acute distress. Pulmonary:      Effort: Pulmonary effort is normal. No respiratory distress.  Neurological:     Mental Status: She is alert and oriented to person, place, and time.  Psychiatric:        Mood and Affect: Mood sad.. tearful at end of appoitnment     Behavior: Behavior normal.  NO SI, NO HI Mild ttp in epigastrum when pt examned self  Assessment and Plan   Nausea Likely due to GER from anxiety and stress.  Avoid triggers such as acidic foods, coffee, caffeine, chocolate, tomatos, citris, alcohol, spicy foods.  Start PPI: prilsoec 20 mg 2 tabs daily for 2-4 weeks.  Call if not improving as expected.  Panic attack  For now while awaiting counseling.. will give pt atarax for panic prn.  GAD (generalized anxiety disorder) Refer to counseling.  Follow up in 2 weeks. Will likely need to start SSRI at that time if minimal improvement.   I discussed the assessment and treatment plan with the patient. The patient was provided an opportunity to ask questions and all were answered. The patient agreed with the plan and demonstrated an understanding of the instructions.   The patient was advised to call back or seek an in-person evaluation if the symptoms worsen or if the condition fails to improve as anticipated.     Eliezer Lofts, MD

## 2018-10-04 NOTE — Assessment & Plan Note (Signed)
Refer to counseling.  Follow up in 2 weeks. Will likely need to start SSRI at that time if minimal improvement.

## 2018-10-04 NOTE — Patient Instructions (Addendum)
Generalized Anxiety and panic attacks: Refer to counseling... they will call you to set this up.  For now while awaiting counseling.. use atarax for panic prn.  Follow up in 2 weeks. Will likely need to start  Medication like paxil at that time if minimal improvement.  Nausea: Likely due to extra acid from anxiety and stress.  Avoid triggers such as acidic foods, coffee, caffeine, chocolate, tomatos, citris, alcohol, spicy foods.  Start PPI: prilsoec OTC 20 mg 2 tabs daily for 2-4 weeks.  Call if not improving as expected or if severe abdominal pain.

## 2018-10-04 NOTE — Assessment & Plan Note (Signed)
Likely due to GER from anxiety and stress.  Avoid triggers such as acidic foods, coffee, caffeine, chocolate, tomatos, citris, alcohol, spicy foods.  Start PPI: prilsoec 20 mg 2 tabs daily for 2-4 weeks.  Call if not improving as expected.

## 2018-10-04 NOTE — Progress Notes (Signed)
Appointment 5/8 pt aware

## 2018-10-04 NOTE — Assessment & Plan Note (Signed)
For now while awaiting counseling.. will give pt atarax for panic prn.

## 2018-10-18 ENCOUNTER — Ambulatory Visit (INDEPENDENT_AMBULATORY_CARE_PROVIDER_SITE_OTHER): Payer: 59 | Admitting: Family Medicine

## 2018-10-18 ENCOUNTER — Encounter: Payer: Self-pay | Admitting: Family Medicine

## 2018-10-18 VITALS — Ht 66.5 in

## 2018-10-18 DIAGNOSIS — R110 Nausea: Secondary | ICD-10-CM

## 2018-10-18 DIAGNOSIS — F411 Generalized anxiety disorder: Secondary | ICD-10-CM

## 2018-10-18 NOTE — Assessment & Plan Note (Signed)
Improved significantly with stress reduction.  Hs atarax to use prn. Will see counselor at least once to set up relaxation and stress reduction plan going forward.

## 2018-10-18 NOTE — Assessment & Plan Note (Signed)
Improved with PPI.Marland Kitchen compelte another 2 weeks then wean off.

## 2018-10-18 NOTE — Progress Notes (Signed)
VIRTUAL VISIT Due to national recommendations of social distancing due to [place] 19, a virtual visit is felt to be most appropriate for this patient at this time.   I connected with the patient on 10/18/18 at  9:40 AM EDT by virtual telehealth platform and verified that I am speaking with the correct person using two identifiers.   I discussed the limitations, risks, security and privacy concerns of performing an evaluation and management service by  virtual telehealth platform and the availability of in person appointments. I also discussed with the patient that there may be a patient responsible charge related to this service. The patient expressed understanding and agreed to proceed.  Patient location: Home Provider Location: [facility] Participants: Eliezer Lofts and Rachell Cipro   Chief Complaint  Patient presents with  . Follow-up    GAD    History of Present Illness: 53 year old female presents for 2 week  follow up generalized anxiety.  At last OV 10/04/2018:palpitations, mild SOB, extreme anxiety, jittery, felt like panic attack. Occurred after son mentioned someone at work tested [employer] for [employer] with relaxation. Since then over the week she reports that she has been very nauseous, decreased appetite, anxiety. Referred to counseling and given atarax to use prn.  For nausea: started PPI and trigger avoidance.   Today she reports:   The prilosec 40 mg has helped with her nausea.. no heartburn.  Increased water, eating healthy.  She  reports the she has only had one panic attack in last 2 weeks.  Atarax helped her calm down the one time she took it, the day after last OV. She still ahs some anxiety, but less.  Able top sleep well at night.  Has not yet seen counselor. Plans to see at least once.   COVID 19 screen No recent travel or known exposure to COVID19 The patient denies respiratory symptoms of COVID 19 at this time.  The importance of  social distancing was discussed today.   Review of Systems  Constitutional: Negative for chills and fever.  HENT: Negative for congestion and ear pain.   Eyes: Negative for pain and redness.  Respiratory: Negative for cough and shortness of breath.   Cardiovascular: Negative for chest pain, palpitations and leg swelling.  Gastrointestinal: Negative for abdominal pain, blood in stool, constipation, diarrhea, nausea and vomiting.  Genitourinary: Negative for dysuria.  Musculoskeletal: Negative for falls and myalgias.  Skin: Negative for rash.  Neurological: Negative for dizziness.  Psychiatric/Behavioral: Negative for depression. The patient is not nervous/anxious.       Past Medical History:  Diagnosis Date  . Allergy   . Hyperlipidemia     reports that she has quit smoking. She has never used smokeless tobacco. She reports that she does not drink alcohol or use drugs.   Current Outpatient Medications:  .  acetaminophen (TYLENOL) 500 MG tablet, Take 1,000 mg by mouth every 6 (six) hours as needed., Disp: , Rfl:  .  atorvastatin (LIPITOR) 10 MG tablet, TAKE 1 TABLET (10 MG TOTAL) BY MOUTH DAILY., Disp: 90 tablet, Rfl: 3 .  Calcium Carbonate-Vitamin D (CALCIUM 600+D) 600-400 MG-UNIT tablet, Take 2 tablets by mouth daily., Disp: , Rfl:  .  cetirizine (ALLERGY 24HOUR INDOOR/OUTDOOR) 10 MG tablet, Take 10 mg by mouth daily. , Disp: , Rfl:  .  fluticasone (FLONASE) 50 MCG/ACT nasal spray, Place 1 spray into both nostrils daily. , Disp: , Rfl:  .  hydrOXYzine (ATARAX/VISTARIL) 50 MG tablet, Take  1 tablet (50 mg total) by mouth 3 (three) times daily as needed for anxiety or nausea., Disp: 30 tablet, Rfl: 0 .  omeprazole (PRILOSEC OTC) 20 MG tablet, Take 40 mg by mouth daily., Disp: , Rfl:  .  vitamin B-12 (CYANOCOBALAMIN) 1000 MCG tablet, Take 1,000 mcg by mouth daily., Disp: , Rfl:    Observations/Objective: Height 5' 6.5" (1.689 m), last menstrual period 03/23/2013.  Physical Exam   Physical Exam Constitutional:      General: The patient is not in acute distress. Pulmonary:     Effort: Pulmonary effort is normal. No respiratory distress.  Neurological:     Mental Status: The patient is alert and oriented to person, place, and time.  Psychiatric:        Mood and Affect: Mood normal.        Behavior: Behavior normal.   Assessment and Plan GAD (generalized anxiety disorder) Improved significantly with stress reduction.  Hs atarax to use prn. Will see counselor at least once to set up relaxation and stress reduction plan going forward.  Nausea Improved with PPI.Marland Kitchen compelte another 2 weeks then wean off.     I discussed the assessment and treatment plan with the patient. The patient was provided an opportunity to ask questions and all were answered. The patient agreed with the plan and demonstrated an understanding of the instructions.   The patient was advised to call back or seek an in-person evaluation if the symptoms worsen or if the condition fails to improve as anticipated.     Eliezer Lofts, MD

## 2018-10-31 ENCOUNTER — Ambulatory Visit (INDEPENDENT_AMBULATORY_CARE_PROVIDER_SITE_OTHER): Payer: 59 | Admitting: Psychology

## 2018-10-31 DIAGNOSIS — F410 Panic disorder [episodic paroxysmal anxiety] without agoraphobia: Secondary | ICD-10-CM | POA: Diagnosis not present

## 2018-11-28 ENCOUNTER — Ambulatory Visit (INDEPENDENT_AMBULATORY_CARE_PROVIDER_SITE_OTHER): Payer: 59 | Admitting: Psychology

## 2018-11-28 DIAGNOSIS — F410 Panic disorder [episodic paroxysmal anxiety] without agoraphobia: Secondary | ICD-10-CM

## 2019-01-16 ENCOUNTER — Ambulatory Visit (INDEPENDENT_AMBULATORY_CARE_PROVIDER_SITE_OTHER): Payer: 59 | Admitting: Psychology

## 2019-01-16 DIAGNOSIS — F410 Panic disorder [episodic paroxysmal anxiety] without agoraphobia: Secondary | ICD-10-CM | POA: Diagnosis not present

## 2019-01-28 ENCOUNTER — Telehealth: Payer: Self-pay

## 2019-01-28 ENCOUNTER — Telehealth: Payer: Self-pay | Admitting: Family Medicine

## 2019-01-28 DIAGNOSIS — E7800 Pure hypercholesterolemia, unspecified: Secondary | ICD-10-CM

## 2019-01-28 NOTE — Telephone Encounter (Signed)
Left detailed VM w COVID screen and back door lab info   

## 2019-01-28 NOTE — Telephone Encounter (Signed)
-----   Message from Cloyd Stagers, RT sent at 01/21/2019  1:34 PM EDT ----- Regarding: Lab Orders for Thursday 8.20.2020 Please place lab orders for Thursday 8.20.2020, office visit for physical on Friday 8.28.2020 Thank you, Dyke Maes RT(R)

## 2019-01-30 ENCOUNTER — Other Ambulatory Visit: Payer: Self-pay

## 2019-01-30 ENCOUNTER — Other Ambulatory Visit (INDEPENDENT_AMBULATORY_CARE_PROVIDER_SITE_OTHER): Payer: 59

## 2019-01-30 DIAGNOSIS — E7800 Pure hypercholesterolemia, unspecified: Secondary | ICD-10-CM

## 2019-01-30 LAB — COMPREHENSIVE METABOLIC PANEL
ALT: 14 U/L (ref 0–35)
AST: 13 U/L (ref 0–37)
Albumin: 4.6 g/dL (ref 3.5–5.2)
Alkaline Phosphatase: 74 U/L (ref 39–117)
BUN: 12 mg/dL (ref 6–23)
CO2: 30 mEq/L (ref 19–32)
Calcium: 9.3 mg/dL (ref 8.4–10.5)
Chloride: 104 mEq/L (ref 96–112)
Creatinine, Ser: 0.86 mg/dL (ref 0.40–1.20)
GFR: 68.76 mL/min (ref >60.00)
Glucose, Bld: 93 mg/dL (ref 70–99)
Potassium: 3.9 mEq/L (ref 3.5–5.1)
Sodium: 142 mEq/L (ref 135–145)
Total Bilirubin: 0.5 mg/dL (ref 0.2–1.2)
Total Protein: 7.1 g/dL (ref 6.0–8.3)

## 2019-01-30 LAB — LIPID PANEL
Cholesterol: 198 mg/dL (ref 0–200)
HDL: 53.4 mg/dL (ref >39.00)
LDL Cholesterol: 118 mg/dL — ABNORMAL HIGH (ref 0–99)
NonHDL: 144.97
Total CHOL/HDL Ratio: 4
Triglycerides: 134 mg/dL (ref 0.0–149.0)
VLDL: 26.8 mg/dL (ref 0.0–40.0)

## 2019-01-30 NOTE — Progress Notes (Signed)
No critical labs need to be addressed urgently. We will discuss labs in detail at upcoming office visit.   

## 2019-02-07 ENCOUNTER — Encounter: Payer: Self-pay | Admitting: Family Medicine

## 2019-02-07 ENCOUNTER — Other Ambulatory Visit: Payer: Self-pay

## 2019-02-07 ENCOUNTER — Ambulatory Visit (INDEPENDENT_AMBULATORY_CARE_PROVIDER_SITE_OTHER): Payer: 59 | Admitting: Family Medicine

## 2019-02-07 VITALS — BP 104/80 | HR 91 | Temp 98.3°F | Ht 66.5 in | Wt 152.8 lb

## 2019-02-07 DIAGNOSIS — F411 Generalized anxiety disorder: Secondary | ICD-10-CM | POA: Diagnosis not present

## 2019-02-07 LAB — T4, FREE: Free T4: 0.81 ng/dL (ref 0.60–1.60)

## 2019-02-07 LAB — T3, FREE: T3, Free: 2.9 pg/mL (ref 2.3–4.2)

## 2019-02-07 LAB — TSH: TSH: 1.16 u[IU]/mL (ref 0.35–4.50)

## 2019-02-07 NOTE — Patient Instructions (Addendum)
Work on regular exercise.  Please stop at the lab to have labs drawn.  Set up mammogram on own.

## 2019-02-07 NOTE — Assessment & Plan Note (Signed)
Improved control. Offered SSRI.Marland Kitchen pt not interested at this time.  Continue counseling. Eval TSH

## 2019-02-07 NOTE — Progress Notes (Signed)
Chief Complaint  Patient presents with  . Annual Exam    History of Present Illness: HPI  The patient is here for annual wellness exam and preventative care.    GAD: Improved control but still some  Days when more anxious.She is currently having counseling with [facility]. Has used atarax twice in last several months. She has low sex drive.  She is wondering if it is hormonal. Lsst menses age 53.  She has hot flashes/ night sweats .. momentary.. not very bothersome.  No further nausea.  Using as needed.4  Cutting back on triggers.    Elevated Cholesterol:  Stable control on lipitor 5 mg daily every other day Lab Results  Component Value Date   CHOL 198 01/30/2019   HDL 53.40 01/30/2019   LDLCALC 118 (H) 01/30/2019   LDLDIRECT 168.7 03/22/2012   TRIG 134.0 01/30/2019   CHOLHDL 4 01/30/2019  Using medications without problems: none Muscle aches: none Diet compliance: good Exercise: sporadic Other complaints:    COVID 19 screen No recent travel or known exposure to [place] The patient denies respiratory symptoms of COVID 19 at this time.  The importance of social distancing was discussed today.   Review of Systems  Constitutional: Negative for chills and fever.  HENT: Negative for congestion and ear pain.   Eyes: Negative for pain and redness.  Respiratory: Negative for cough and shortness of breath.   Cardiovascular: Negative for chest pain, palpitations and leg swelling.  Gastrointestinal: Negative for abdominal pain, blood in stool, constipation, diarrhea, nausea and vomiting.  Genitourinary: Negative for dysuria.  Musculoskeletal: Negative for falls and myalgias.  Skin: Negative for rash.  Neurological: Negative for dizziness.  Psychiatric/Behavioral: Negative for depression. The patient is not nervous/anxious.       Past Medical History:  Diagnosis Date  . Allergy   . Hyperlipidemia     reports that she has quit smoking. She has never used smokeless tobacco.  She reports that she does not drink alcohol or use drugs.   Current Outpatient Medications:  .  acetaminophen (TYLENOL) 500 MG tablet, Take 1,000 mg by mouth every 6 (six) hours as needed., Disp: , Rfl:  .  atorvastatin (LIPITOR) 10 MG tablet, TAKE 1 TABLET (10 MG TOTAL) BY MOUTH DAILY., Disp: 90 tablet, Rfl: 3 .  Calcium Carbonate-Vitamin D (CALCIUM 600+D) 600-400 MG-UNIT tablet, Take 2 tablets by mouth daily., Disp: , Rfl:  .  cetirizine (ALLERGY 24HOUR INDOOR/OUTDOOR) 10 MG tablet, Take 10 mg by mouth daily. , Disp: , Rfl:  .  fluticasone (FLONASE) 50 MCG/ACT nasal spray, Place 1 spray into both nostrils daily. , Disp: , Rfl:  .  hydrOXYzine (ATARAX/VISTARIL) 50 MG tablet, Take 1 tablet (50 mg total) by mouth 3 (three) times daily as needed for anxiety or nausea., Disp: 30 tablet, Rfl: 0 .  omeprazole (PRILOSEC OTC) 20 MG tablet, Take 40 mg by mouth daily., Disp: , Rfl:  .  vitamin B-12 (CYANOCOBALAMIN) 1000 MCG tablet, Take 1,000 mcg by mouth daily., Disp: , Rfl:    Observations/Objective: Blood pressure 104/80, pulse 91, temperature 98.3 F (36.8 C), temperature source Temporal, height 5' 6.5" (1.689 m), weight 152 lb 12 oz (69.3 kg), last menstrual period 03/23/2013, SpO2 98 %.  Physical Exam Constitutional:      General: She is not in acute distress.    Appearance: Normal appearance. She is well-developed. She is not ill-appearing or toxic-appearing.  HENT:     Head: Normocephalic.     Right Ear: Hearing,  tympanic membrane, ear canal and external ear normal.     Left Ear: Hearing, tympanic membrane, ear canal and external ear normal.     Nose: Nose normal.  Eyes:     General: Lids are normal. Lids are everted, no foreign bodies appreciated.     Conjunctiva/sclera: Conjunctivae normal.     Pupils: Pupils are equal, round, and reactive to light.  Neck:     Musculoskeletal: Normal range of motion and neck supple.     Thyroid: No thyroid mass or thyromegaly.     Vascular: No  carotid bruit.     Trachea: Trachea normal.  Cardiovascular:     Rate and Rhythm: Normal rate and regular rhythm.     Heart sounds: Normal heart sounds, S1 normal and S2 normal. No murmur. No gallop.   Pulmonary:     Effort: Pulmonary effort is normal. No respiratory distress.     Breath sounds: Normal breath sounds. No wheezing, rhonchi or rales.  Abdominal:     General: Bowel sounds are normal. There is no distension or abdominal bruit.     Palpations: Abdomen is soft. There is no fluid wave or mass.     Tenderness: There is no abdominal tenderness. There is no guarding or rebound.     Hernia: No hernia is present.  Lymphadenopathy:     Cervical: No cervical adenopathy.  Skin:    General: Skin is warm and dry.     Findings: No rash.  Neurological:     Mental Status: She is alert.     Cranial Nerves: No cranial nerve deficit.     Sensory: No sensory deficit.  Psychiatric:        Mood and Affect: Mood is not anxious or depressed.        Speech: Speech normal.        Behavior: Behavior normal. Behavior is cooperative.        Judgment: Judgment normal.      Assessment and Plan The patient's preventative maintenance and recommended screening tests for an annual wellness exam were reviewed in full today. Brought up to date unless services declined.  Counselled on the importance of diet, exercise, and its role in overall health and mortality. The patient's FH and SH was reviewed, including their home life, tobacco status, and drug and alcohol status.   Vaccines: uptodate, flu at work. Mammo: 12/2018nml, sister with breast cancer. BRCA1 and BRCA 2 neg. Smoking:no STD screen/ HIV : refused. PAP/DVE: Last nml 2016 neg co testing, repeat in 5 years, NoDVE indicated, asymptomatic. Colon:  Neg 2019 colonoscopy repeat in 5 years.    Eliezer Lofts, MD

## 2019-02-07 NOTE — Progress Notes (Signed)
No critical labs need to be addressed urgently. We will discuss labs in detail at upcoming office visit.   

## 2019-02-20 ENCOUNTER — Ambulatory Visit (INDEPENDENT_AMBULATORY_CARE_PROVIDER_SITE_OTHER): Payer: 59 | Admitting: Psychology

## 2019-02-20 DIAGNOSIS — F410 Panic disorder [episodic paroxysmal anxiety] without agoraphobia: Secondary | ICD-10-CM

## 2019-03-17 ENCOUNTER — Other Ambulatory Visit: Payer: Self-pay | Admitting: Family Medicine

## 2019-03-17 MED FILL — ATORVASTATIN 10 MG TABLET: 10 | 90 days supply | Qty: 90 | Fill #0

## 2019-04-04 ENCOUNTER — Telehealth: Payer: Self-pay | Admitting: Family Medicine

## 2019-04-04 NOTE — Telephone Encounter (Signed)
Patient called today in regards to arequest she called about a few weeks ago,  She was trying to follow up to see if we had learned why her claims were being reprocessed and charged a higher amount  This was for both her and her husband.   Joyce Brock had originally take the information and sent it over to [facility] to try to get more information on. Have you heard any other information for this?

## 2019-04-07 NOTE — Telephone Encounter (Signed)
I spoke with the patient to explain the information that I received from the billing and coding department. That patient will be filing a grievance with her insurance company.

## 2019-04-08 DIAGNOSIS — Z1231 Encounter for screening mammogram for malignant neoplasm of breast: Secondary | ICD-10-CM | POA: Diagnosis not present

## 2019-04-08 DIAGNOSIS — Z803 Family history of malignant neoplasm of breast: Secondary | ICD-10-CM | POA: Diagnosis not present

## 2019-04-08 LAB — HM MAMMOGRAPHY

## 2019-04-11 ENCOUNTER — Encounter: Payer: Self-pay | Admitting: Family Medicine

## 2019-04-16 DIAGNOSIS — H5213 Myopia, bilateral: Secondary | ICD-10-CM | POA: Diagnosis not present

## 2019-04-16 DIAGNOSIS — H52223 Regular astigmatism, bilateral: Secondary | ICD-10-CM | POA: Diagnosis not present

## 2019-04-16 DIAGNOSIS — H524 Presbyopia: Secondary | ICD-10-CM | POA: Diagnosis not present

## 2019-04-24 ENCOUNTER — Ambulatory Visit (INDEPENDENT_AMBULATORY_CARE_PROVIDER_SITE_OTHER): Payer: 59 | Admitting: Psychology

## 2019-04-24 DIAGNOSIS — F410 Panic disorder [episodic paroxysmal anxiety] without agoraphobia: Secondary | ICD-10-CM | POA: Diagnosis not present

## 2019-04-29 ENCOUNTER — Other Ambulatory Visit: Payer: Self-pay

## 2019-04-29 ENCOUNTER — Ambulatory Visit (INDEPENDENT_AMBULATORY_CARE_PROVIDER_SITE_OTHER): Payer: 59 | Admitting: Family Medicine

## 2019-04-29 ENCOUNTER — Encounter: Payer: Self-pay | Admitting: Family Medicine

## 2019-04-29 VITALS — BP 110/80 | HR 84 | Temp 99.0°F | Ht 66.5 in | Wt 153.2 lb

## 2019-04-29 DIAGNOSIS — R1032 Left lower quadrant pain: Secondary | ICD-10-CM | POA: Diagnosis not present

## 2019-04-29 LAB — POC URINALSYSI DIPSTICK (AUTOMATED)
Bilirubin, UA: NEGATIVE
Blood, UA: NEGATIVE
Glucose, UA: NEGATIVE
Ketones, UA: NEGATIVE
Leukocytes, UA: NEGATIVE
Nitrite, UA: NEGATIVE
Protein, UA: NEGATIVE
Spec Grav, UA: 1.01 (ref 1.010–1.025)
Urobilinogen, UA: 0.2 E.U./dL
pH, UA: 6 (ref 5.0–8.0)

## 2019-04-29 NOTE — Patient Instructions (Signed)
Gentle abdominal wall stretching. Ibuprofen  and heat as needed. Call if pain worsening or evolving symtpoms.

## 2019-04-29 NOTE — Progress Notes (Signed)
Chief Complaint  Patient presents with  . Abdominal Pain    Lifted heavy something last Monday-Felt a pop/strain    History of Present Illness: HPI    53 year old female patient presents with new onset pain  1 week ago.. had sharp pop/grab pain in central lower abdomen after lifting heavy item.  Since then off and on having ache, occ cramping in central low abd... pain radiated to LLQ.  Treated with ice and heat. Ibuprofen off and on.   No diarrhea, no N/V.  no lump, no bulge.  No  Dysuria.  She has started feeling batter in last 24 hour.. now pain is dull.   Urinalysis    Component Value Date/Time   COLORURINE yellow 10/30/2008 1350   APPEARANCEUR Clear 10/30/2008 1350   LABSPEC 1.010 10/30/2008 1350   PHURINE 6.5 10/30/2008 1350   HGBUR negative 10/30/2008 1350   BILIRUBINUR Negative 04/29/2019 0957   PROTEINUR Negative 04/29/2019 0957   UROBILINOGEN 0.2 04/29/2019 0957   UROBILINOGEN 0.2 10/30/2008 1350   NITRITE Negative 04/29/2019 0957   NITRITE negative 10/30/2008 1350   LEUKOCYTESUR Negative 04/29/2019 0957      COVID 19 screen No recent travel or known exposure to [place] The patient denies respiratory symptoms of COVID 19 at this time.  The importance of social distancing was discussed today.   Review of Systems  Constitutional: Negative for chills and fever.  HENT: Negative for congestion and ear pain.   Eyes: Negative for pain and redness.  Respiratory: Negative for cough and shortness of breath.   Cardiovascular: Negative for chest pain, palpitations and leg swelling.  Gastrointestinal: Negative for abdominal pain, blood in stool, constipation, diarrhea, nausea and vomiting.  Genitourinary: Negative for dysuria.  Musculoskeletal: Negative for falls and myalgias.  Skin: Negative for rash.  Neurological: Negative for dizziness.  Psychiatric/Behavioral: Negative for depression. The patient is not nervous/anxious.       Past Medical History:   Diagnosis Date  . Allergy   . Hyperlipidemia     reports that she has quit smoking. She has never used smokeless tobacco. She reports that she does not drink alcohol or use drugs.   Current Outpatient Medications:  .  acetaminophen (TYLENOL) 500 MG tablet, Take 1,000 mg by mouth every 6 (six) hours as needed., Disp: , Rfl:  .  atorvastatin (LIPITOR) 10 MG tablet, TAKE 1 TABLET BY MOUTH DAILY., Disp: 90 tablet, Rfl: 3 .  Calcium Carbonate-Vitamin D (CALCIUM 600+D) 600-400 MG-UNIT tablet, Take 2 tablets by mouth daily., Disp: , Rfl:  .  cetirizine (ALLERGY 24HOUR INDOOR/OUTDOOR) 10 MG tablet, Take 10 mg by mouth daily. , Disp: , Rfl:  .  fluticasone (FLONASE) 50 MCG/ACT nasal spray, Place 1 spray into both nostrils daily. , Disp: , Rfl:  .  hydrOXYzine (ATARAX/VISTARIL) 50 MG tablet, Take 1 tablet (50 mg total) by mouth 3 (three) times daily as needed for anxiety or nausea., Disp: 30 tablet, Rfl: 0 .  omeprazole (PRILOSEC OTC) 20 MG tablet, Take 40 mg by mouth daily., Disp: , Rfl:  .  vitamin B-12 (CYANOCOBALAMIN) 1000 MCG tablet, Take 1,000 mcg by mouth daily., Disp: , Rfl:    Observations/Objective: Blood pressure 110/80, pulse 84, temperature 99 F (37.2 C), temperature source Oral, height 5' 6.5" (1.689 m), weight 153 lb 4 oz (69.5 kg), last menstrual period 03/23/2013, SpO2 97 %.  Physical Exam Constitutional:      General: She is not in acute distress.    Appearance: Normal  appearance. She is well-developed. She is not ill-appearing or toxic-appearing.  HENT:     Head: Normocephalic.     Right Ear: Hearing, tympanic membrane, ear canal and external ear normal. Tympanic membrane is not erythematous, retracted or bulging.     Left Ear: Hearing, tympanic membrane, ear canal and external ear normal. Tympanic membrane is not erythematous, retracted or bulging.     Nose: No mucosal edema or rhinorrhea.     Right Sinus: No maxillary sinus tenderness or frontal sinus tenderness.     Left  Sinus: No maxillary sinus tenderness or frontal sinus tenderness.     Mouth/Throat:     Pharynx: Uvula midline.  Eyes:     General: Lids are normal. Lids are everted, no foreign bodies appreciated.     Conjunctiva/sclera: Conjunctivae normal.     Pupils: Pupils are equal, round, and reactive to light.  Neck:     Musculoskeletal: Normal range of motion and neck supple.     Thyroid: No thyroid mass or thyromegaly.     Vascular: No carotid bruit.     Trachea: Trachea normal.  Cardiovascular:     Rate and Rhythm: Normal rate and regular rhythm.     Pulses: Normal pulses.     Heart sounds: Normal heart sounds, S1 normal and S2 normal. No murmur. No friction rub. No gallop.   Pulmonary:     Effort: Pulmonary effort is normal. No tachypnea or respiratory distress.     Breath sounds: Normal breath sounds. No decreased breath sounds, wheezing, rhonchi or rales.  Abdominal:     General: Bowel sounds are normal.     Palpations: Abdomen is soft.     Tenderness: There is no abdominal tenderness.     Hernia: There is no hernia in the umbilical area, ventral area, left inguinal area, right femoral area, left femoral area or right inguinal area.     Comments:  ttp mild over central abdominal area... at  Ligament joining rectus muscles  Skin:    General: Skin is warm and dry.     Findings: No rash.  Neurological:     Mental Status: She is alert.  Psychiatric:        Mood and Affect: Mood is not anxious or depressed.        Speech: Speech normal.        Behavior: Behavior normal. Behavior is cooperative.        Thought Content: Thought content normal.        Judgment: Judgment normal.      Assessment and Plan  Abdominal wall soreness: Start gentle stretching exercises, NSAIDs, heat.     Eliezer Lofts, MD

## 2019-05-02 NOTE — Telephone Encounter (Signed)
I spoke with the patient she stated she when she spoke with [facility] and was told her claims were processed with a tax id under [employer] and that's why the claims were reprocessed.  I will send the information to billing and coding for review.

## 2019-05-15 ENCOUNTER — Telehealth: Payer: Self-pay | Admitting: Family Medicine

## 2019-05-15 NOTE — Telephone Encounter (Signed)
Patient returned your call.  She stated she received a message from you in regards to her insurance

## 2019-05-16 ENCOUNTER — Other Ambulatory Visit: Payer: Self-pay

## 2019-05-16 DIAGNOSIS — Z20822 Contact with and (suspected) exposure to covid-19: Secondary | ICD-10-CM

## 2019-05-17 LAB — NOVEL CORONAVIRUS, NAA: SARS-CoV-2, NAA: NOT DETECTED

## 2019-08-28 ENCOUNTER — Other Ambulatory Visit (HOSPITAL_COMMUNITY): Payer: Self-pay | Admitting: Unknown Physician Specialty

## 2019-08-28 DIAGNOSIS — R519 Headache, unspecified: Secondary | ICD-10-CM | POA: Diagnosis not present

## 2019-08-28 DIAGNOSIS — J3489 Other specified disorders of nose and nasal sinuses: Secondary | ICD-10-CM | POA: Diagnosis not present

## 2019-08-28 DIAGNOSIS — J309 Allergic rhinitis, unspecified: Secondary | ICD-10-CM | POA: Diagnosis not present

## 2019-08-28 MED FILL — FLUTICASONE PROP 50 MCG SPR: 50 | 90 days supply | Qty: 48 | Fill #0

## 2019-08-28 MED FILL — CETIRIZINE HCL 10 MG TABS: 10 | 90 days supply | Qty: 90 | Fill #0

## 2019-09-18 DIAGNOSIS — J309 Allergic rhinitis, unspecified: Secondary | ICD-10-CM | POA: Diagnosis not present

## 2019-09-18 DIAGNOSIS — J3489 Other specified disorders of nose and nasal sinuses: Secondary | ICD-10-CM | POA: Diagnosis not present

## 2019-09-18 DIAGNOSIS — R519 Headache, unspecified: Secondary | ICD-10-CM | POA: Diagnosis not present

## 2019-09-22 MED FILL — ATORVASTATIN 10 MG TABLET: 10 | 90 days supply | Qty: 90 | Fill #1

## 2019-12-30 DIAGNOSIS — J301 Allergic rhinitis due to pollen: Secondary | ICD-10-CM | POA: Diagnosis not present

## 2019-12-31 DIAGNOSIS — J3489 Other specified disorders of nose and nasal sinuses: Secondary | ICD-10-CM | POA: Diagnosis not present

## 2019-12-31 DIAGNOSIS — R519 Headache, unspecified: Secondary | ICD-10-CM | POA: Diagnosis not present

## 2019-12-31 DIAGNOSIS — J309 Allergic rhinitis, unspecified: Secondary | ICD-10-CM | POA: Diagnosis not present

## 2020-01-01 MED FILL — CETIRIZINE HCL 10 MG TABS: 10 | 90 days supply | Qty: 90 | Fill #1

## 2020-01-02 DIAGNOSIS — J301 Allergic rhinitis due to pollen: Secondary | ICD-10-CM | POA: Diagnosis not present

## 2020-01-02 MED FILL — EPINEPHRINE 0.3 MG AUTO-INJ: 0.3 | 30 days supply | Qty: 2 | Fill #0

## 2020-01-09 DIAGNOSIS — G4733 Obstructive sleep apnea (adult) (pediatric): Secondary | ICD-10-CM | POA: Diagnosis not present

## 2020-01-10 ENCOUNTER — Emergency Department: Payer: 59

## 2020-01-10 ENCOUNTER — Other Ambulatory Visit: Payer: Self-pay

## 2020-01-10 ENCOUNTER — Inpatient Hospital Stay
Admission: EM | Admit: 2020-01-10 | Discharge: 2020-01-12 | DRG: 641 | Disposition: A | Discharge status: Home / Self Care | Payer: 59 | Attending: Internal Medicine | Admitting: Internal Medicine

## 2020-01-10 DIAGNOSIS — F411 Generalized anxiety disorder: Secondary | ICD-10-CM | POA: Diagnosis present

## 2020-01-10 DIAGNOSIS — R55 Syncope and collapse: Secondary | ICD-10-CM | POA: Diagnosis present

## 2020-01-10 DIAGNOSIS — E860 Dehydration: Principal | ICD-10-CM | POA: Diagnosis present

## 2020-01-10 DIAGNOSIS — J309 Allergic rhinitis, unspecified: Secondary | ICD-10-CM | POA: Diagnosis not present

## 2020-01-10 DIAGNOSIS — N390 Urinary tract infection, site not specified: Secondary | ICD-10-CM

## 2020-01-10 DIAGNOSIS — R911 Solitary pulmonary nodule: Secondary | ICD-10-CM | POA: Diagnosis present

## 2020-01-10 DIAGNOSIS — Z79899 Other long term (current) drug therapy: Secondary | ICD-10-CM

## 2020-01-10 DIAGNOSIS — Z87891 Personal history of nicotine dependence: Secondary | ICD-10-CM

## 2020-01-10 DIAGNOSIS — E785 Hyperlipidemia, unspecified: Secondary | ICD-10-CM | POA: Diagnosis present

## 2020-01-10 DIAGNOSIS — R112 Nausea with vomiting, unspecified: Secondary | ICD-10-CM | POA: Diagnosis present

## 2020-01-10 DIAGNOSIS — R9431 Abnormal electrocardiogram [ECG] [EKG]: Secondary | ICD-10-CM | POA: Diagnosis not present

## 2020-01-10 DIAGNOSIS — J9811 Atelectasis: Secondary | ICD-10-CM | POA: Diagnosis not present

## 2020-01-10 DIAGNOSIS — R42 Dizziness and giddiness: Secondary | ICD-10-CM | POA: Diagnosis not present

## 2020-01-10 DIAGNOSIS — Z20822 Contact with and (suspected) exposure to covid-19: Secondary | ICD-10-CM | POA: Diagnosis not present

## 2020-01-10 DIAGNOSIS — K219 Gastro-esophageal reflux disease without esophagitis: Secondary | ICD-10-CM | POA: Diagnosis present

## 2020-01-10 DIAGNOSIS — R110 Nausea: Secondary | ICD-10-CM | POA: Diagnosis not present

## 2020-01-10 DIAGNOSIS — G43909 Migraine, unspecified, not intractable, without status migrainosus: Secondary | ICD-10-CM | POA: Diagnosis present

## 2020-01-10 DIAGNOSIS — R410 Disorientation, unspecified: Secondary | ICD-10-CM | POA: Diagnosis not present

## 2020-01-10 DIAGNOSIS — I951 Orthostatic hypotension: Secondary | ICD-10-CM

## 2020-01-10 DIAGNOSIS — R918 Other nonspecific abnormal finding of lung field: Secondary | ICD-10-CM | POA: Diagnosis not present

## 2020-01-10 DIAGNOSIS — R000 Tachycardia, unspecified: Secondary | ICD-10-CM | POA: Diagnosis not present

## 2020-01-10 HISTORY — DX: Anxiety disorder, unspecified: F41.9

## 2020-01-10 HISTORY — DX: Gastro-esophageal reflux disease without esophagitis: K21.9

## 2020-01-10 HISTORY — DX: Migraine, unspecified, not intractable, without status migrainosus: G43.909

## 2020-01-10 LAB — CBC WITH DIFFERENTIAL/PLATELET
Abs Immature Granulocytes: 0.02 10*3/uL (ref 0.00–0.07)
Basophils Absolute: 0 10*3/uL (ref 0.0–0.1)
Basophils Relative: 0 %
Eosinophils Absolute: 0.1 10*3/uL (ref 0.0–0.5)
Eosinophils Relative: 1 %
HCT: 41.3 % (ref 36.0–46.0)
Hemoglobin: 13.9 g/dL (ref 12.0–15.0)
Immature Granulocytes: 0 %
Lymphocytes Relative: 5 %
Lymphs Abs: 0.4 10*3/uL — ABNORMAL LOW (ref 0.7–4.0)
MCH: 29.6 pg (ref 26.0–34.0)
MCHC: 33.7 g/dL (ref 30.0–36.0)
MCV: 87.9 fL (ref 80.0–100.0)
Monocytes Absolute: 0.3 10*3/uL (ref 0.1–1.0)
Monocytes Relative: 3 %
Neutro Abs: 7.6 10*3/uL (ref 1.7–7.7)
Neutrophils Relative %: 91 %
Platelets: 156 10*3/uL (ref 150–400)
RBC: 4.7 MIL/uL (ref 3.87–5.11)
RDW: 12.7 % (ref 11.5–15.5)
WBC: 8.4 10*3/uL (ref 4.0–10.5)
nRBC: 0 % (ref 0.0–0.2)

## 2020-01-10 LAB — BASIC METABOLIC PANEL
Anion gap: 6 (ref 5–15)
BUN: 12 mg/dL (ref 6–20)
CO2: 27 mmol/L (ref 22–32)
Calcium: 8.2 mg/dL — ABNORMAL LOW (ref 8.9–10.3)
Chloride: 107 mmol/L (ref 98–111)
Creatinine, Ser: 0.86 mg/dL (ref 0.44–1.00)
GFR calc Af Amer: >60 mL/min (ref >60)
GFR calc non Af Amer: >60 mL/min (ref >60)
Glucose, Bld: 131 mg/dL — ABNORMAL HIGH (ref 70–99)
Potassium: 3.8 mmol/L (ref 3.5–5.1)
Sodium: 140 mmol/L (ref 135–145)

## 2020-01-10 LAB — FIBRIN DERIVATIVES D-DIMER (ARMC ONLY): Fibrin derivatives D-dimer (ARMC): 541.84 ng/mL (FEU) — ABNORMAL HIGH (ref 0.00–499.00)

## 2020-01-10 LAB — TROPONIN I (HIGH SENSITIVITY): Troponin I (High Sensitivity): <2 ng/L (ref <18)

## 2020-01-10 IMAGING — CT CT HEAD W/O CM
3 series · 16 of 47 positions shown, 19 images · non-contrast
Comparison: None.

CLINICAL DATA: Syncopal episodes

EXAM:
CT HEAD WITHOUT CONTRAST
TECHNIQUE: Contiguous axial images were obtained from the base of the skull
through the vertex without intravenous contrast.

[Series 3: head wo · axial · 0.41mm/px · z∈[-167,-42]mm · 10 of 30 slices shown, 13 images]
[im 3/30  brain]
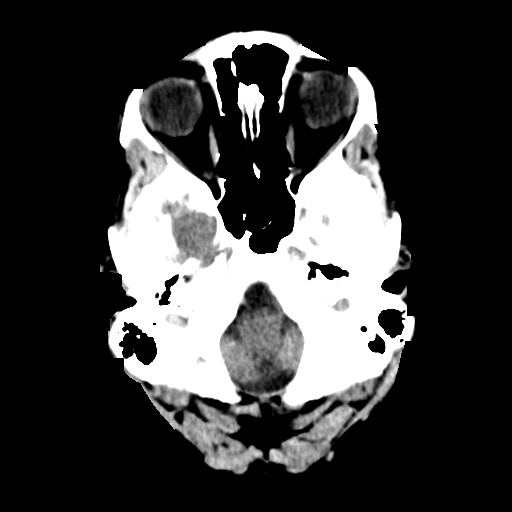
[im 3/30  bone]
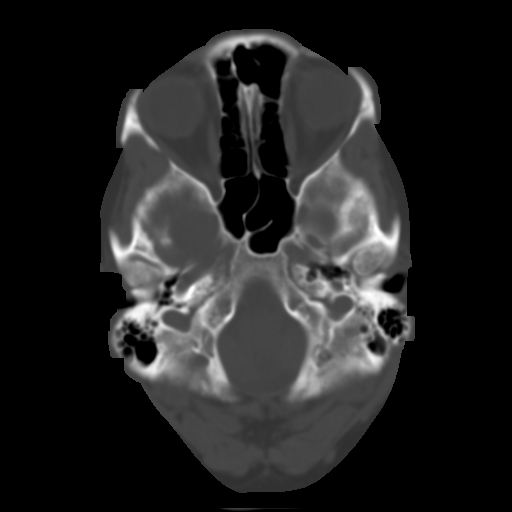
[im 6/30  brain]
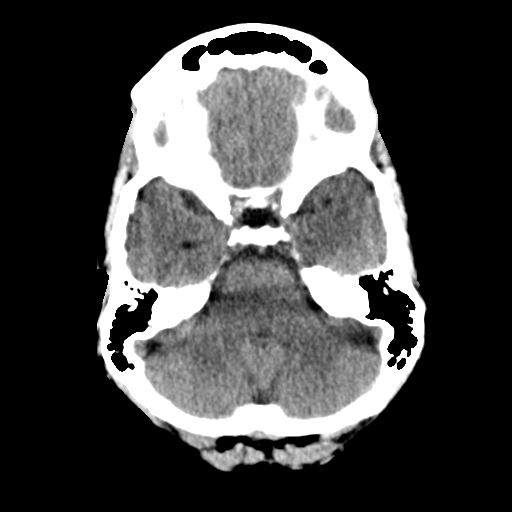
[im 9/30  brain]
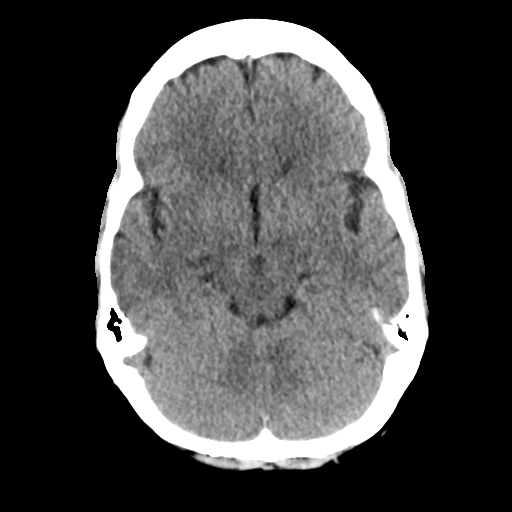
[im 11/30  brain]
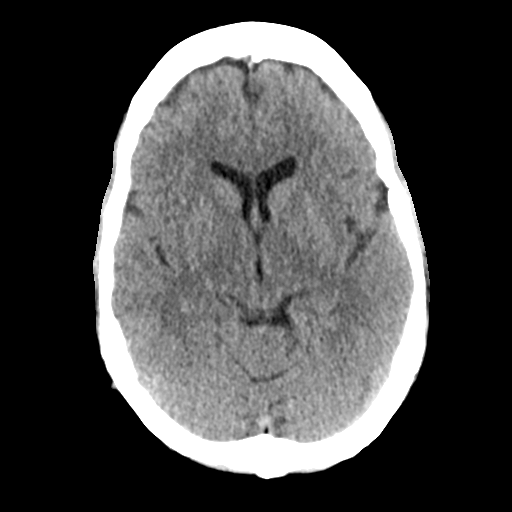
[im 14/30  brain]
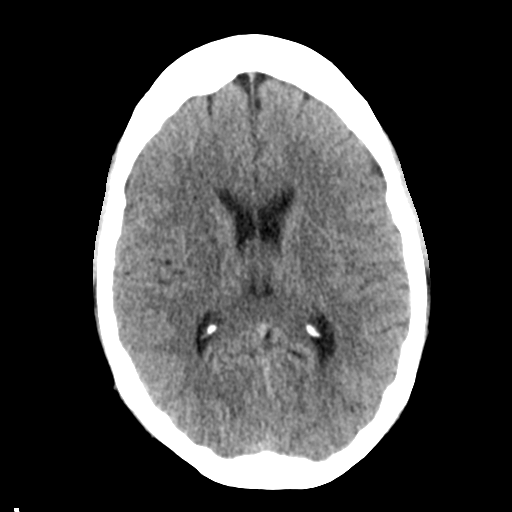
[im 14/30  bone]
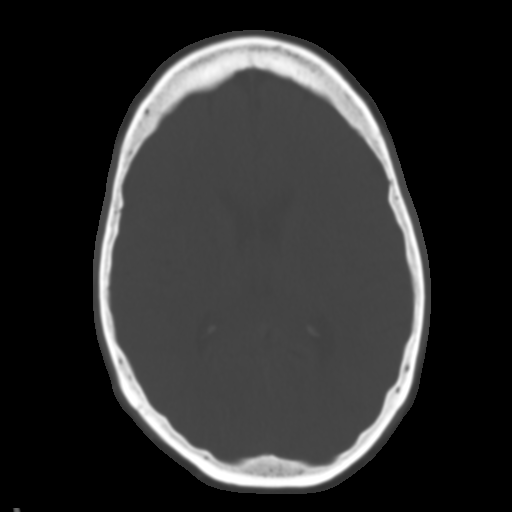
[im 17/30  brain]
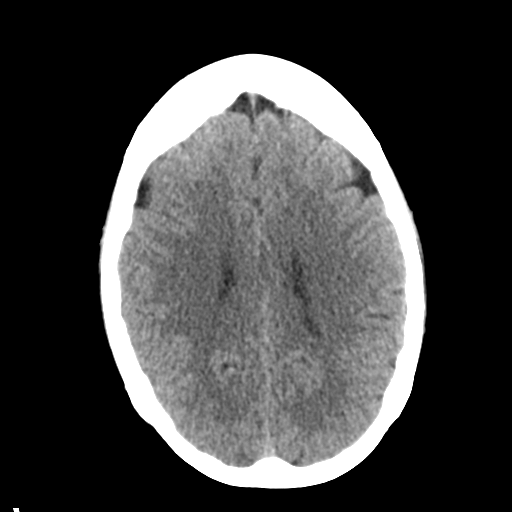
[im 20/30  brain]
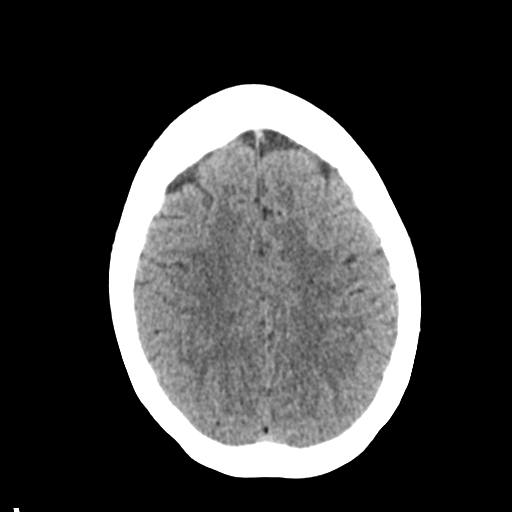
[im 23/30  brain]
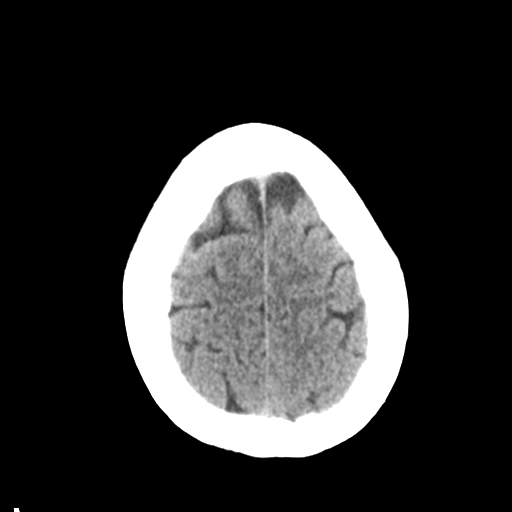
[im 25/30  brain]
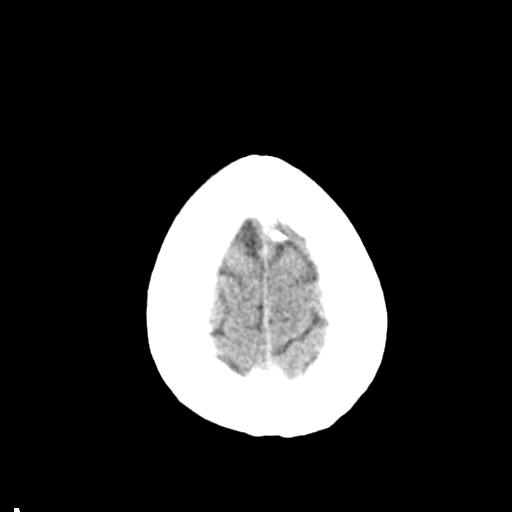
[im 25/30  bone]
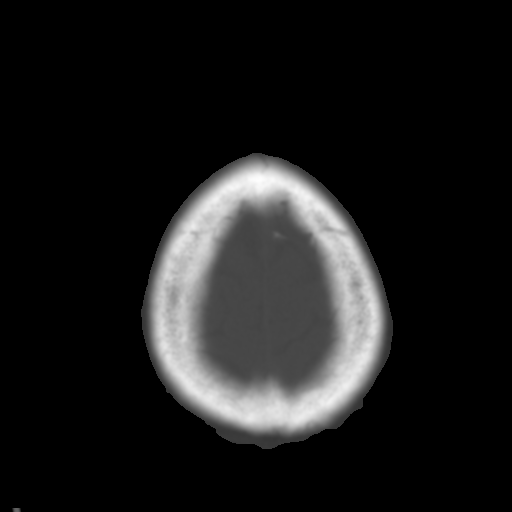
[im 28/30  brain]
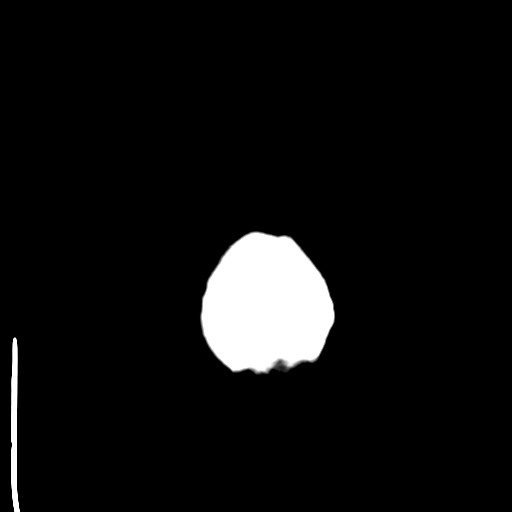

[Series 4: coronal soft tissue · coronal · 0.28mm/px · 3 of 64 slices shown]
[im 22/64  brain]
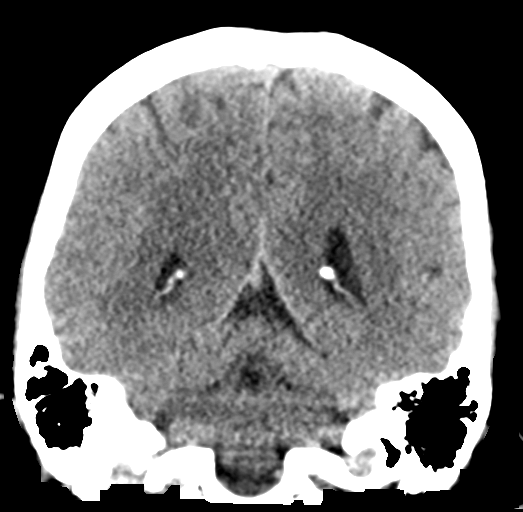
[im 29/64  brain]
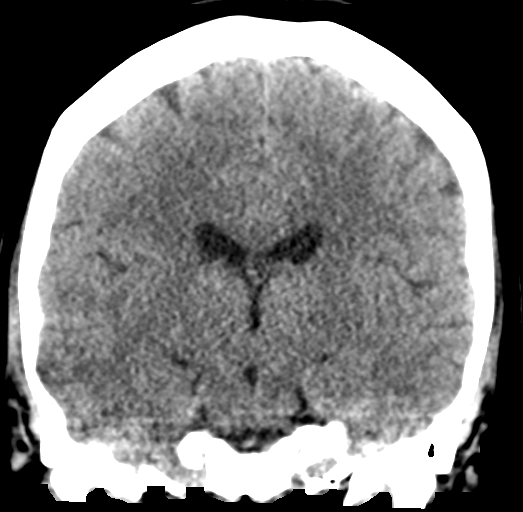
[im 36/64  brain]
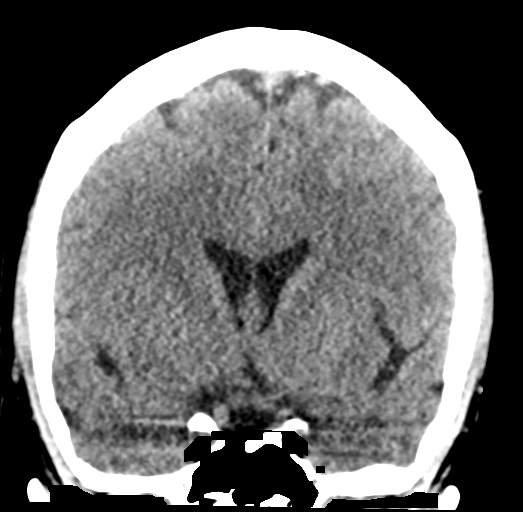

[Series 5: sagittal soft tissue · sagittal · 0.28mm/px · 3 of 50 slices shown]
[im 17/50  brain]
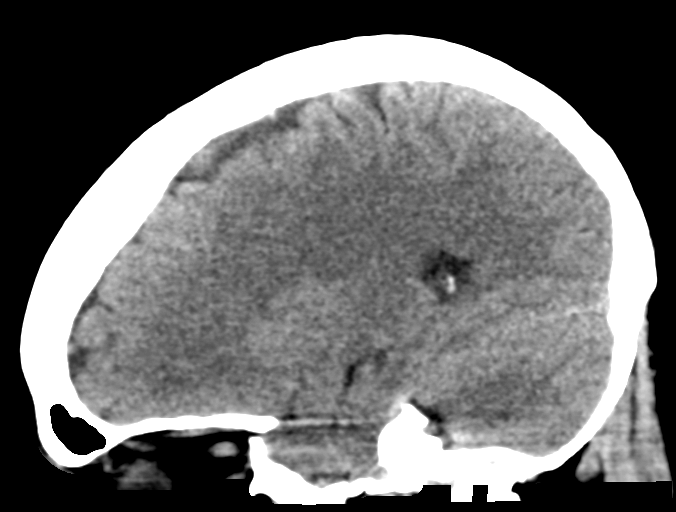
[im 25/50  brain]
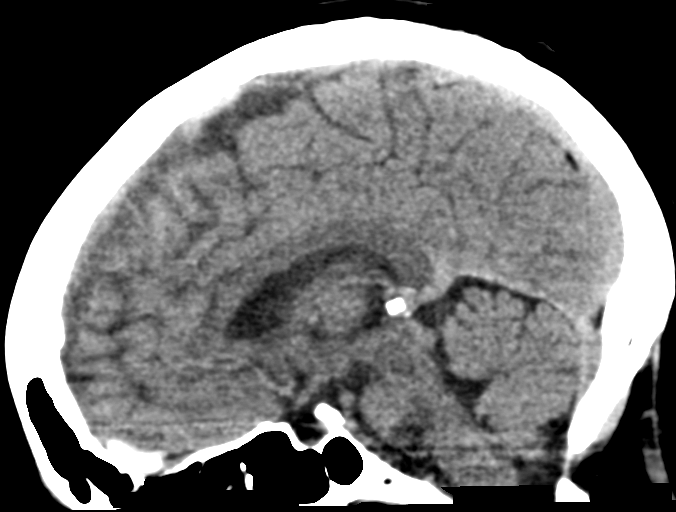
[im 33/50  brain]
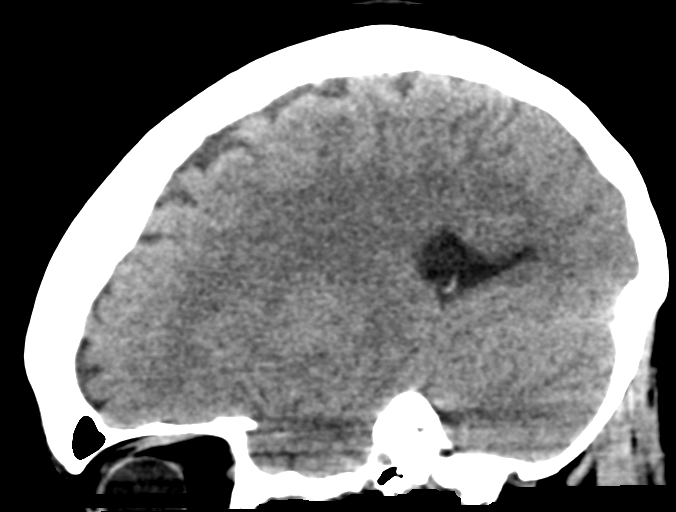

[16 of 47 positions shown; findings below may reference images not displayed]

FINDINGS: Brain: No evidence of acute infarction, hemorrhage, hydrocephalus,
extra-axial collection or mass lesion/mass effect.

Vascular: No hyperdense vessel or unexpected calcification.

Skull: Normal. Negative for fracture or focal lesion.

Sinuses/Orbits: No acute finding.

Other: None.
IMPRESSION: No acute intracranial abnormality noted.

## 2020-01-10 IMAGING — DX DG CHEST 1V PORT
1 series · 1 of 1 positions shown · non-contrast
Comparison: None.

CLINICAL DATA: Syncope

EXAM:
PORTABLE CHEST 1 VIEW

[chest ap]
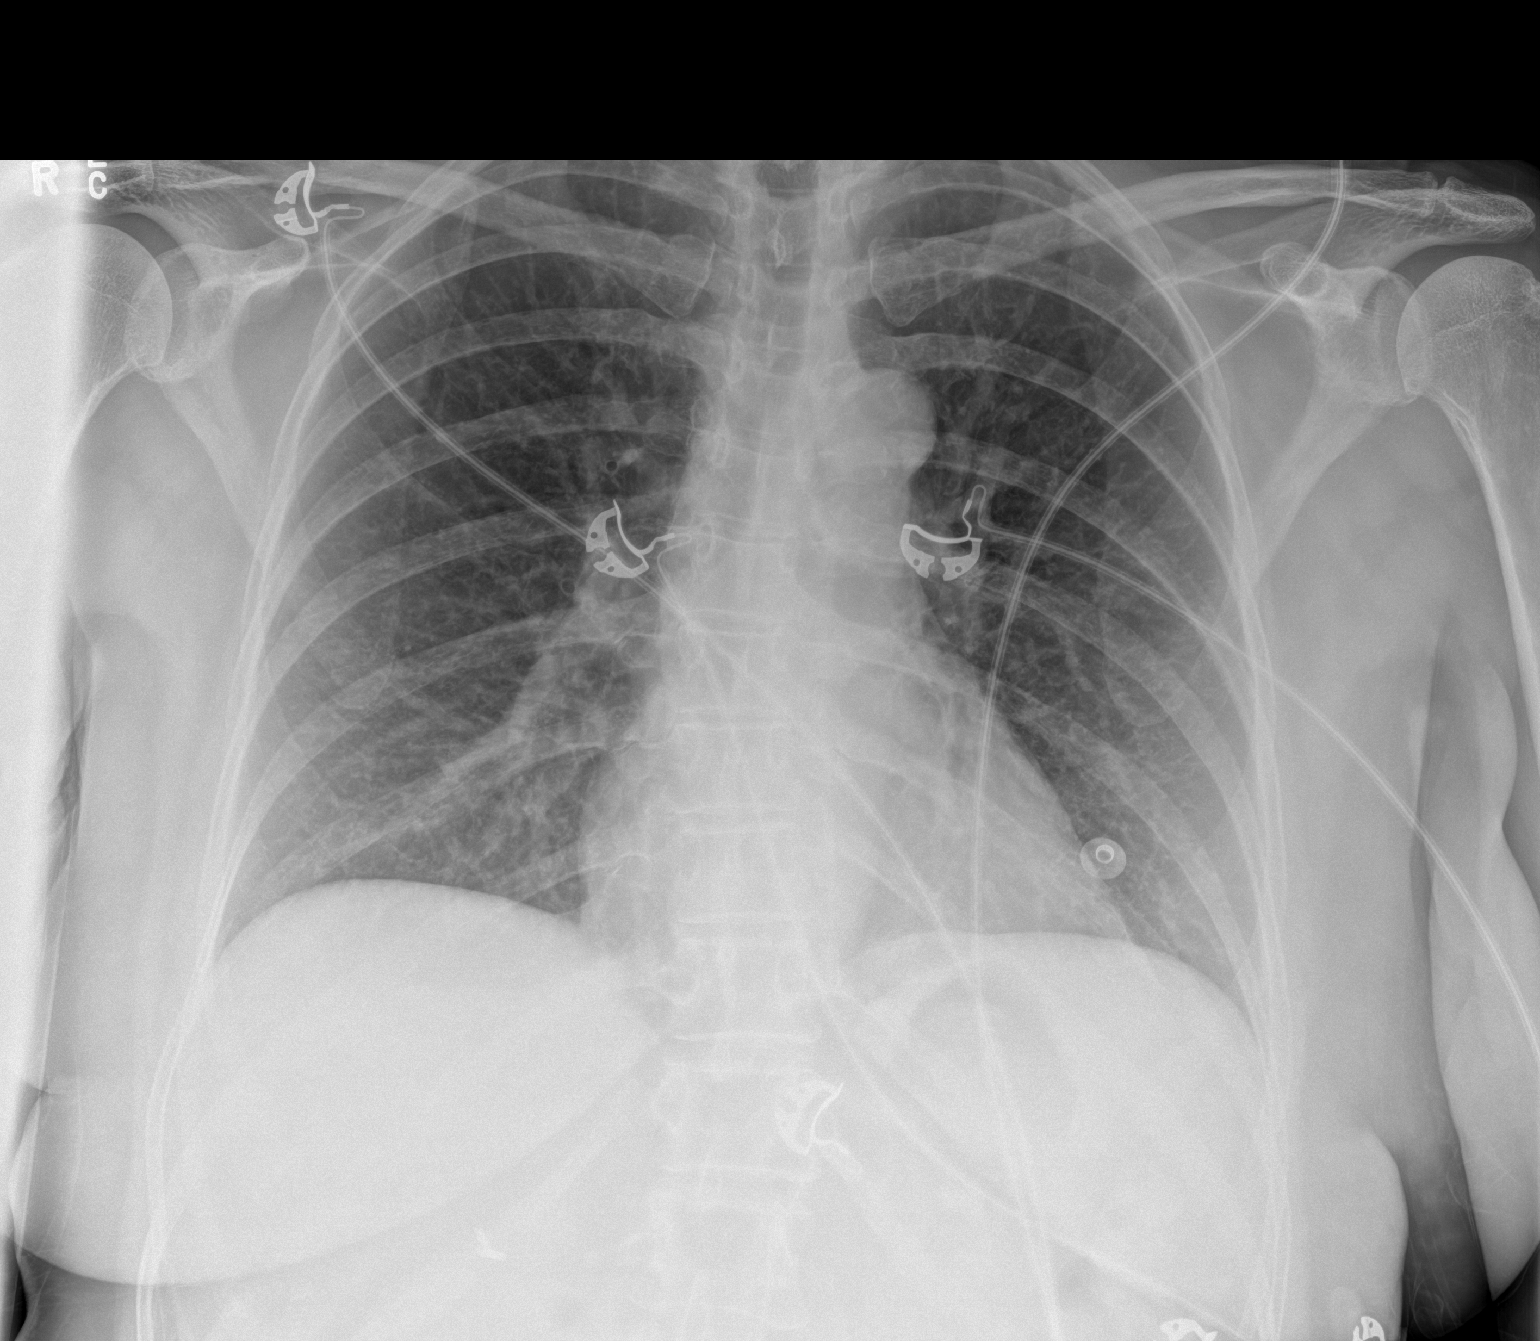

[1 of 1 positions shown; findings below may reference images not displayed]

FINDINGS: The heart size and mediastinal contours are within normal limits.
Both lungs are clear. The visualized skeletal structures are
unremarkable.
IMPRESSION: No active disease.

## 2020-01-10 MED ORDER — SODIUM CHLORIDE 0.9 % IV BOLUS
1000.0000 mL | Freq: Once | INTRAVENOUS | Status: AC
  Administered 2020-01-11: 1000 mL via INTRAVENOUS

## 2020-01-10 NOTE — ED Notes (Signed)
Called lab to verify blood and orders received

## 2020-01-10 NOTE — ED Triage Notes (Signed)
Pt arrives via [facility] from home with complaint of syncopal episodes. Pt family reports unresponsive for 1-2 minutes. Does not remember episode. Reports multiple episodes/near syncopal.  Dizzy/lightheaded with intermittent nausea. Pt reported chest pressure en route.   Pt A&O x4 on arrival, NAD, but does still get dizzy/lightheaded when standing   146/88 seated 132/80 standing  128 hr seated  133 hr standing  553mL NS bolus 133 cbg

## 2020-01-10 NOTE — ED Provider Notes (Signed)
[facility] Emergency Department Provider Note  ____________________________________________   I have reviewed the triage vital signs and the nursing notes.   HISTORY  Chief Complaint Loss of Consciousness   History limited by: Not Limited   HPI Joyce Brock is a 54 y.o. female who presents to the emergency department today after a syncopal episode.  The patient states that over the past week she has had a couple episodes of dizziness.  Today however she started feeling significantly nauseous.  She then sat down and passed out for about a minute.  She does state that over the past week she has had some discomfort in her chest which she has been attributing to acid reflux.  She denies any significant shortness of breath.  The patient did recently travel to [place].   Records reviewed. Per medical record review patient has a history of HLD.  Past Medical History:  Diagnosis Date  . Allergy   . Hyperlipidemia     Patient Active Problem List   Diagnosis Date Noted  . GAD (generalized anxiety disorder) 10/04/2018  . Panic attack 10/04/2018  . Nausea 10/04/2018  . Atopic dermatitis 01/18/2017  . Left upper arm pain 09/25/2014  . Verruca vulgaris 11/15/2012  . Rectal pain 03/29/2012  . Palpitations 12/26/2011  . HYPERCHOLESTEROLEMIA 02/05/2009  . MAMMOGRAM, ABNORMAL, LEFT 02/05/2009  . ALLERGIC RHINITIS 10/30/2008  . PSORIASIS 10/30/2008  . URINARY INCONTINENCE, MIXED 10/30/2008    Past Surgical History:  Procedure Laterality Date  . CHOLECYSTECTOMY      Prior to Admission medications   Medication Sig Start Date End Date Taking? Authorizing Provider  acetaminophen (TYLENOL) 500 MG tablet Take 1,000 mg by mouth every 6 (six) hours as needed.    [provider]  atorvastatin (LIPITOR) 10 MG tablet TAKE 1 TABLET BY MOUTH DAILY. 03/17/19   Bedsole, Amy E, MD  Calcium Carbonate-Vitamin D (CALCIUM 600+D) 600-400 MG-UNIT tablet Take 2  tablets by mouth daily.    [provider]  cetirizine (ALLERGY 24HOUR INDOOR/OUTDOOR) 10 MG tablet Take 10 mg by mouth daily.     [provider]  fluticasone (FLONASE) 50 MCG/ACT nasal spray Place 1 spray into both nostrils daily.  11/20/16   [provider]  hydrOXYzine (ATARAX/VISTARIL) 50 MG tablet Take 1 tablet (50 mg total) by mouth 3 (three) times daily as needed for anxiety or nausea. 10/04/18   Bedsole, Amy E, MD  omeprazole (PRILOSEC OTC) 20 MG tablet Take 40 mg by mouth daily.    [provider]  vitamin B-12 (CYANOCOBALAMIN) 1000 MCG tablet Take 1,000 mcg by mouth daily.    [provider]    Allergies Patient has no known allergies.  Family History  Problem Relation Age of Onset  . Osteoporosis Mother   . Irritable bowel syndrome Mother   . Cancer Sister 72       breast   . Stomach cancer Paternal Grandfather   . Colon cancer Neg Hx   . Esophageal cancer Neg Hx   . Rectal cancer Neg Hx     Social History Social History   Tobacco Use  . Smoking status: Former Research scientist (life sciences)  . Smokeless tobacco: Never Used  Vaping Use  . Vaping Use: Never used  Substance Use Topics  . Alcohol use: No  . Drug use: No    Review of Systems Constitutional: No fever/chills Eyes: No visual changes. ENT: No sore throat. Cardiovascular: Positive for chest pain. Respiratory: Denies shortness of breath. Gastrointestinal:  No abdominal pain.  Positive for nausea.   Genitourinary: Negative for dysuria. Musculoskeletal: Negative for back pain. Skin: Negative for rash. Neurological: Positive for syncopal episode ____________________________________________   PHYSICAL EXAM:  VITAL SIGNS: ED Triage Vitals  Enc Vitals Group     BP 01/10/20 2222 (!) 140/92     Pulse Rate 01/10/20 2222 (!) 115     Resp 01/10/20 2222 19     Temp 01/10/20 2222 98.4 F (36.9 C)     Temp Source 01/10/20 2222 Oral     SpO2 01/10/20 2222 98 %     Weight 01/10/20 2223  164 lb (74.4 kg)     Height 01/10/20 2223 5\' 6"  (1.676 m)     Head Circumference --      Peak Flow --      Pain Score 01/10/20 2222 3    Constitutional: Alert and oriented.  Eyes: Conjunctivae are normal.  ENT      Head: Normocephalic and atraumatic.      Nose: No congestion/rhinnorhea.      Mouth/Throat: Mucous membranes are moist.      Neck: No stridor. Hematological/Lymphatic/Immunilogical: No cervical lymphadenopathy. Cardiovascular: Normal rate, regular rhythm.  No murmurs, rubs, or gallops.  Respiratory: Normal respiratory effort without tachypnea nor retractions. Breath sounds are clear and equal bilaterally. No wheezes/rales/rhonchi. Gastrointestinal: Soft and non tender. No rebound. No guarding.  Genitourinary: Deferred Musculoskeletal: Normal range of motion in all extremities. No lower extremity edema. Neurologic:  Normal speech and language. No gross focal neurologic deficits are appreciated.  Skin:  Skin is warm, dry and intact. No rash noted. Psychiatric: Mood and affect are normal. Speech and behavior are normal. Patient exhibits appropriate insight and judgment.  ____________________________________________    LABS (pertinent positives/negatives)  Trop hs <2 BMP wnl except glu 131, ca 8.2 CBC wbc 8.4, hgb 13.9, plt 156  ____________________________________________   EKG  I, Nance Pear, attending physician, personally viewed and interpreted this EKG  EKG Time: 2221 Rate: 116 Rhythm: sinus tachycardia Axis: normal Intervals: qtc 494 QRS: narrow ST changes: no st elevation Impression: abnormal ekg  ____________________________________________   PROCEDURES  Procedures  ____________________________________________   INITIAL IMPRESSION / ASSESSMENT AND PLAN / ED COURSE  Pertinent labs & imaging results that were available during my care of the patient were reviewed by me and considered in my medical decision making (see chart for details).    Patient presented to the emergency department today after a syncopal episode.  The time of the initial exam patient is awake and alert.  Patient was found to be tachycardic.  I do have some concerns for possible PE given recent travel to [facility] and tachycardia.  Will check blood work as well as D-dimer.  EKG without any ST elevation.  ____________________________________________   FINAL CLINICAL IMPRESSION(S) / ED DIAGNOSES  Final diagnoses:  Syncope, unspecified syncope type     Note: This dictation was prepared with Dragon dictation. Any transcriptional errors that result from this process are unintentional     Nance Pear, MD 01/10/20 2336

## 2020-01-11 ENCOUNTER — Encounter: Payer: Self-pay | Admitting: Family Medicine

## 2020-01-11 ENCOUNTER — Emergency Department: Payer: 59

## 2020-01-11 ENCOUNTER — Inpatient Hospital Stay (HOSPITAL_COMMUNITY)
Admit: 2020-01-11 | Discharge: 2020-01-11 | Disposition: A | Discharge status: Home / Self Care | Payer: 59 | Attending: Family Medicine | Admitting: Family Medicine

## 2020-01-11 DIAGNOSIS — J309 Allergic rhinitis, unspecified: Secondary | ICD-10-CM | POA: Diagnosis present

## 2020-01-11 DIAGNOSIS — J9811 Atelectasis: Secondary | ICD-10-CM | POA: Diagnosis not present

## 2020-01-11 DIAGNOSIS — E785 Hyperlipidemia, unspecified: Secondary | ICD-10-CM

## 2020-01-11 DIAGNOSIS — K219 Gastro-esophageal reflux disease without esophagitis: Secondary | ICD-10-CM | POA: Diagnosis present

## 2020-01-11 DIAGNOSIS — G43909 Migraine, unspecified, not intractable, without status migrainosus: Secondary | ICD-10-CM | POA: Diagnosis present

## 2020-01-11 DIAGNOSIS — R000 Tachycardia, unspecified: Secondary | ICD-10-CM | POA: Diagnosis not present

## 2020-01-11 DIAGNOSIS — Z79899 Other long term (current) drug therapy: Secondary | ICD-10-CM | POA: Diagnosis not present

## 2020-01-11 DIAGNOSIS — R911 Solitary pulmonary nodule: Secondary | ICD-10-CM | POA: Insufficient documentation

## 2020-01-11 DIAGNOSIS — R55 Syncope and collapse: Secondary | ICD-10-CM | POA: Diagnosis present

## 2020-01-11 DIAGNOSIS — Z20822 Contact with and (suspected) exposure to covid-19: Secondary | ICD-10-CM | POA: Diagnosis not present

## 2020-01-11 DIAGNOSIS — F411 Generalized anxiety disorder: Secondary | ICD-10-CM | POA: Diagnosis present

## 2020-01-11 DIAGNOSIS — R918 Other nonspecific abnormal finding of lung field: Secondary | ICD-10-CM | POA: Diagnosis not present

## 2020-01-11 DIAGNOSIS — Z87891 Personal history of nicotine dependence: Secondary | ICD-10-CM | POA: Diagnosis not present

## 2020-01-11 DIAGNOSIS — N390 Urinary tract infection, site not specified: Secondary | ICD-10-CM | POA: Diagnosis present

## 2020-01-11 DIAGNOSIS — E860 Dehydration: Secondary | ICD-10-CM | POA: Diagnosis present

## 2020-01-11 DIAGNOSIS — R9431 Abnormal electrocardiogram [ECG] [EKG]: Secondary | ICD-10-CM

## 2020-01-11 DIAGNOSIS — R112 Nausea with vomiting, unspecified: Secondary | ICD-10-CM | POA: Diagnosis present

## 2020-01-11 LAB — TSH: TSH: 0.864 u[IU]/mL (ref 0.350–4.500)

## 2020-01-11 LAB — URINALYSIS, COMPLETE (UACMP) WITH MICROSCOPIC
Bacteria, UA: NONE SEEN
Bilirubin Urine: NEGATIVE
Glucose, UA: NEGATIVE mg/dL
Hgb urine dipstick: NEGATIVE
Ketones, ur: NEGATIVE mg/dL
Nitrite: NEGATIVE
Protein, ur: NEGATIVE mg/dL
Specific Gravity, Urine: 1.02 (ref 1.005–1.030)
pH: 7 (ref 5.0–8.0)

## 2020-01-11 LAB — URINE DRUG SCREEN, QUALITATIVE (ARMC ONLY)
Amphetamines, Ur Screen: NOT DETECTED
Barbiturates, Ur Screen: NOT DETECTED
Benzodiazepine, Ur Scrn: NOT DETECTED
Cannabinoid 50 Ng, Ur ~~LOC~~: NOT DETECTED
Cocaine Metabolite,Ur ~~LOC~~: NOT DETECTED
MDMA (Ecstasy)Ur Screen: NOT DETECTED
Methadone Scn, Ur: NOT DETECTED
Opiate, Ur Screen: NOT DETECTED
Phencyclidine (PCP) Ur S: NOT DETECTED
Tricyclic, Ur Screen: NOT DETECTED

## 2020-01-11 LAB — ECHOCARDIOGRAM COMPLETE
Area-P 1/2: 3.81 cm2
Height: 66 in
S' Lateral: 2.53 cm
Weight: 2624 oz

## 2020-01-11 LAB — SARS CORONAVIRUS 2 BY RT PCR (HOSPITAL ORDER, PERFORMED IN ~~LOC~~ HOSPITAL LAB): SARS Coronavirus 2: NEGATIVE

## 2020-01-11 LAB — TROPONIN I (HIGH SENSITIVITY): Troponin I (High Sensitivity): <2 ng/L (ref <18)

## 2020-01-11 LAB — T4, FREE: Free T4: 0.95 ng/dL (ref 0.61–1.12)

## 2020-01-11 IMAGING — CT CT ANGIO CHEST
2 of 6 series · 18 of 46 positions shown · IV contrast (APPLIED)
Comparison: None.

CLINICAL DATA: Syncope

EXAM:
CT ANGIOGRAPHY CHEST WITH CONTRAST
TECHNIQUE: Multidetector CT imaging of the chest was performed using the
standard protocol during bolus administration of intravenous
contrast. Multiplanar CT image reconstructions and MIPs were
obtained to evaluate the vascular anatomy.
CONTRAST:  75mL OMNIPAQUE IOHEXOL 350 MG/ML SOLN

[Series 5: thins · axial · 0.64mm/px · z∈[-785,-570]mm · 15 of 237 slices shown]
[im 11/237  lung]
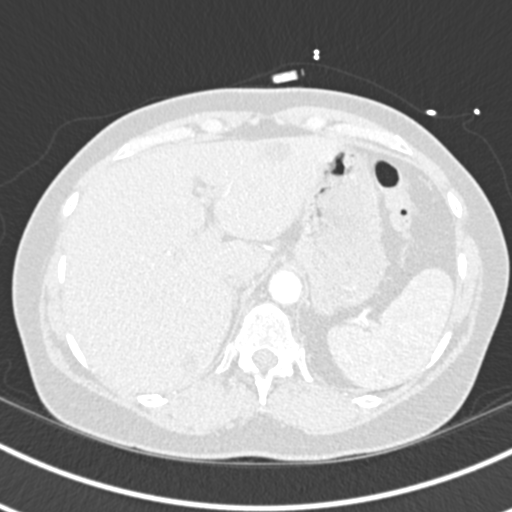
[im 31/237  soft-tissue]
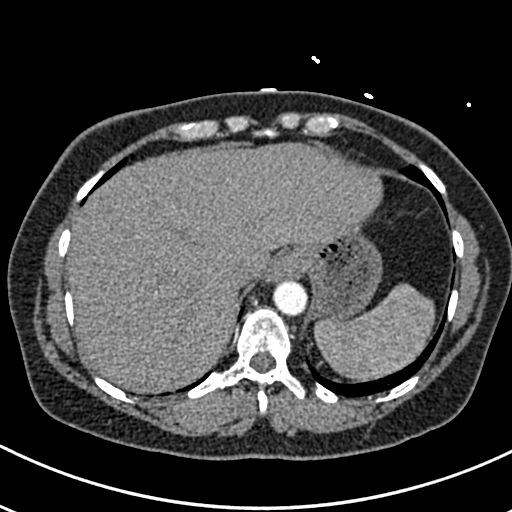
[im 42/237  lung]
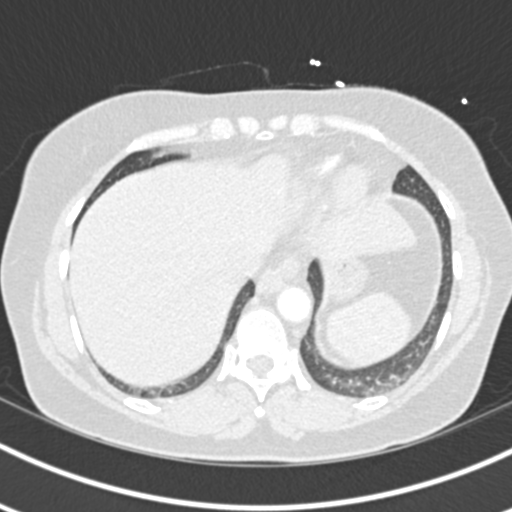
[im 62/237  soft-tissue]
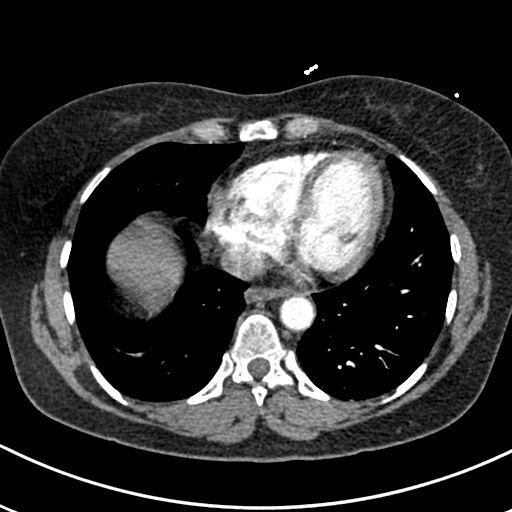
[im 72/237  lung]
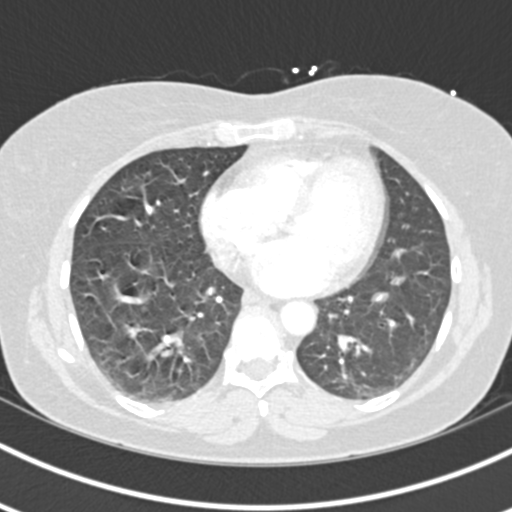
[im 93/237  soft-tissue]
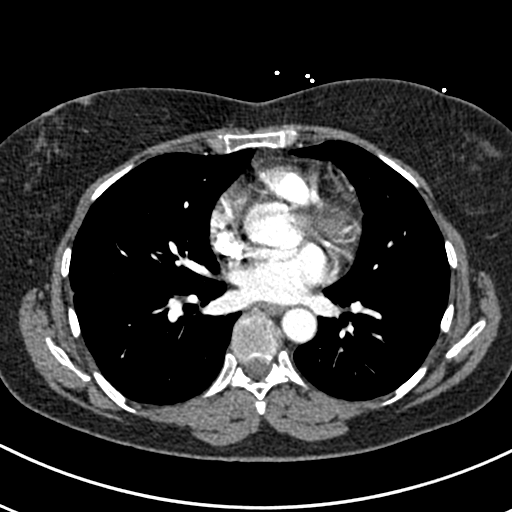
[im 103/237  lung]
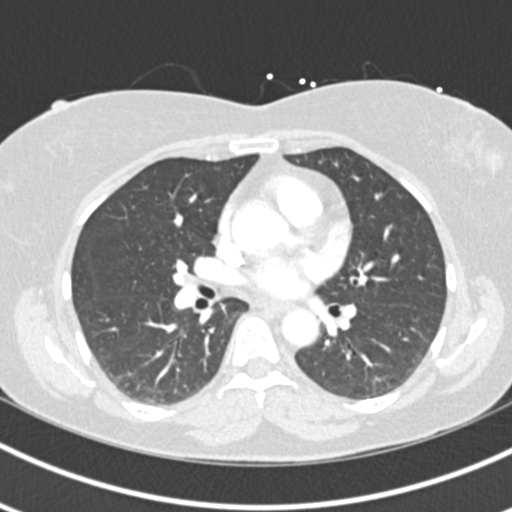
[im 124/237  soft-tissue]
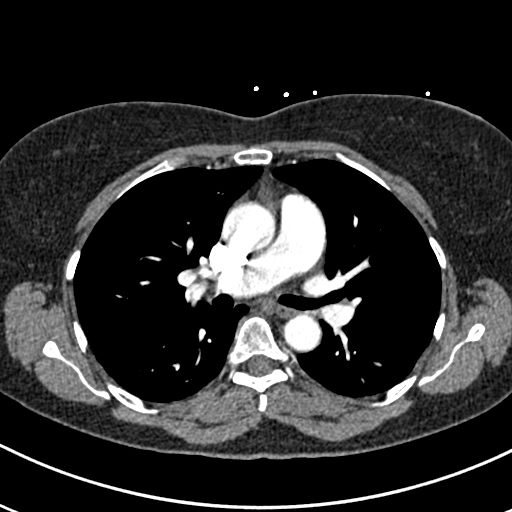
[im 134/237  lung]
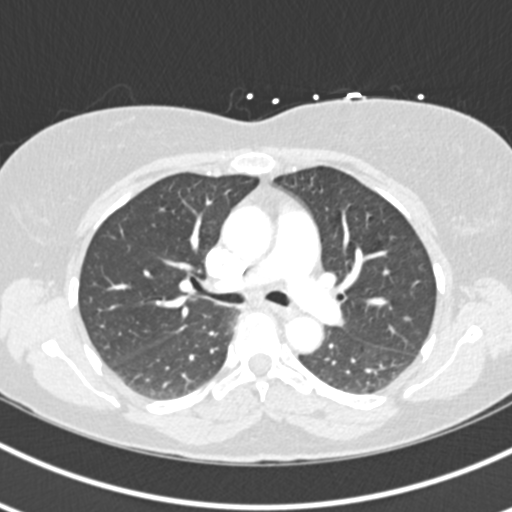
[im 144/237  soft-tissue]
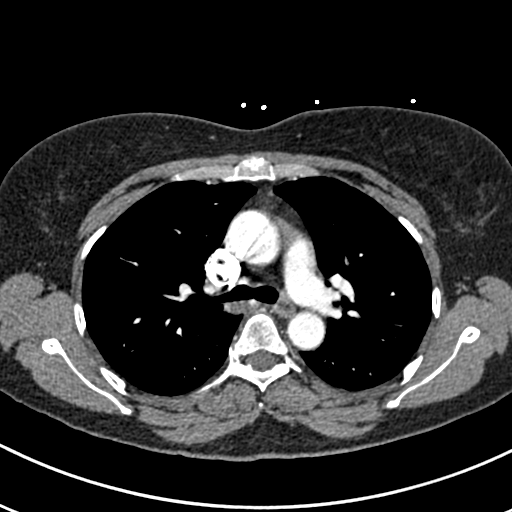
[im 165/237  lung]
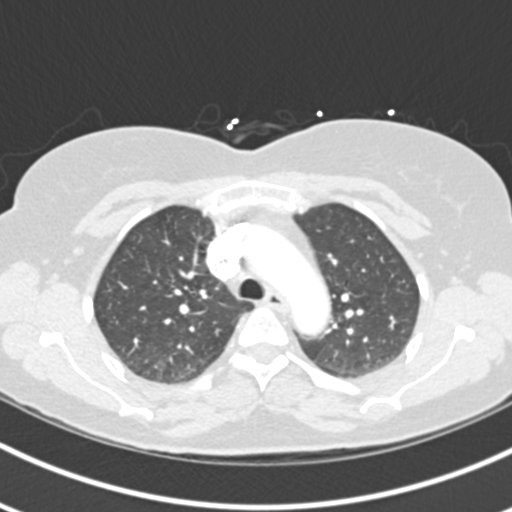
[im 175/237  soft-tissue]
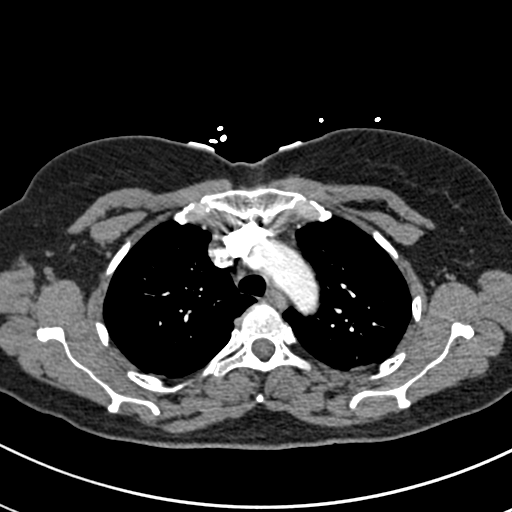
[im 195/237  lung]
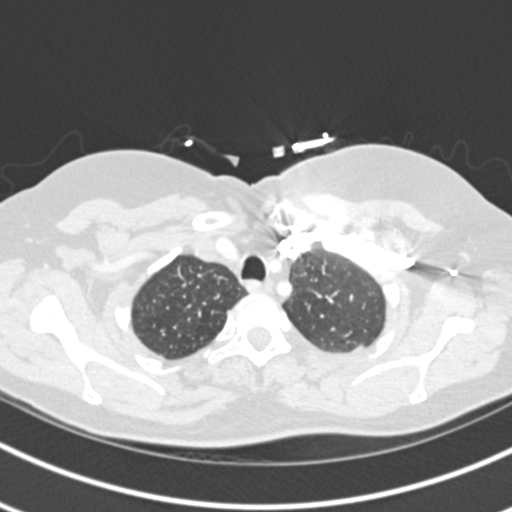
[im 206/237  soft-tissue]
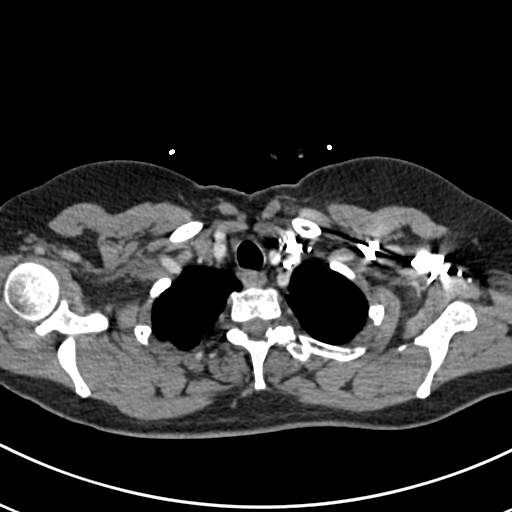
[im 226/237  lung]
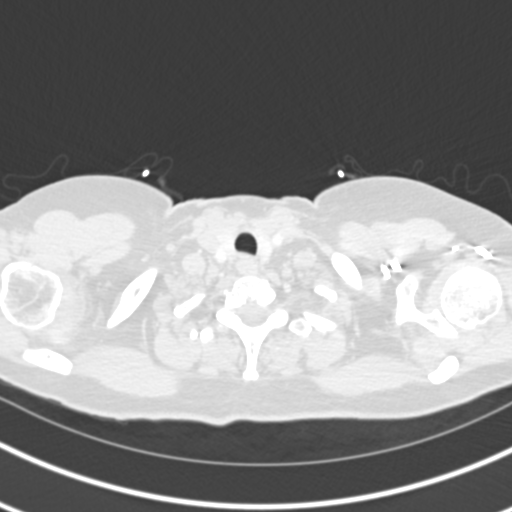

[Series 7: coronal mpr · coronal · 0.49mm/px · 3 of 79 slices shown]
[im 20/79  soft-tissue]
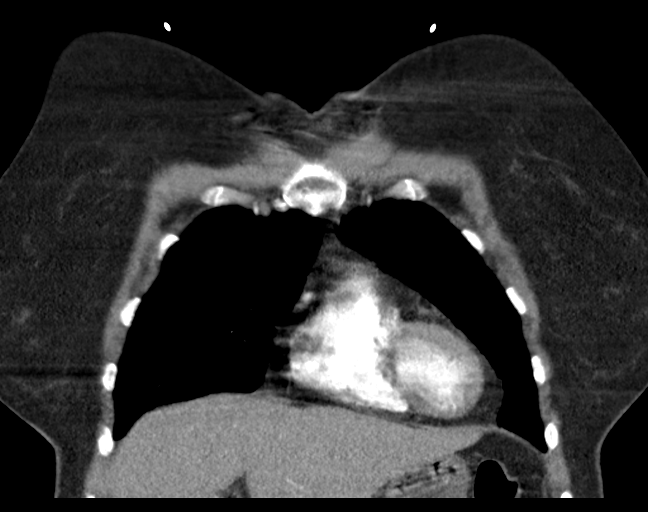
[im 40/79  soft-tissue]
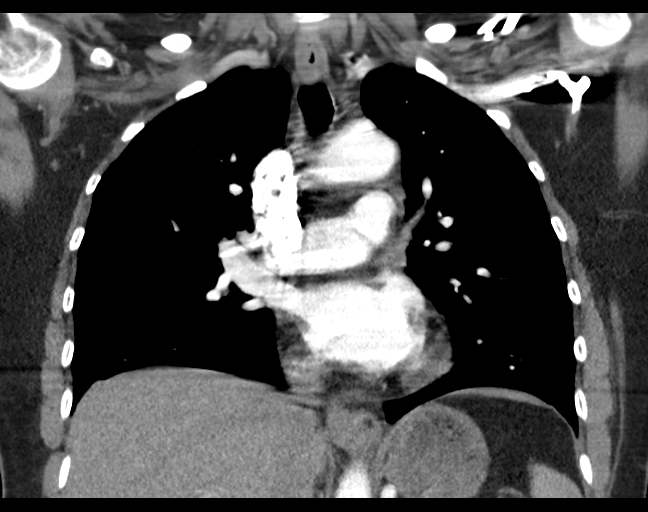
[im 59/79  soft-tissue]
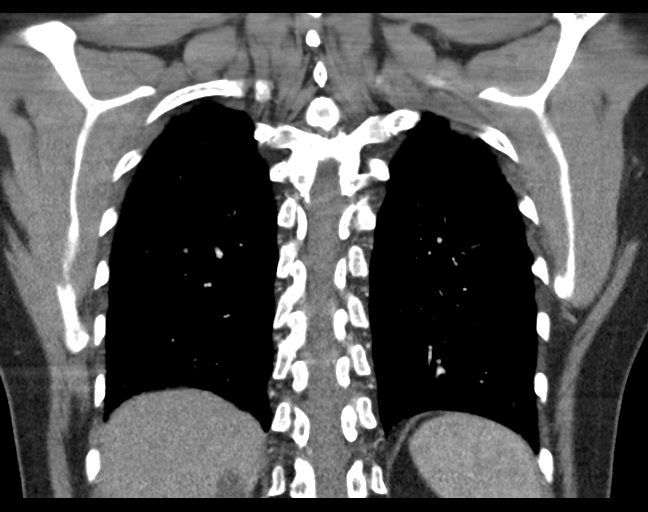

[18 of 46 positions shown; findings below may reference images not displayed]

FINDINGS: Cardiovascular: There is a optimal opacification of the pulmonary
arteries. There is no central,segmental, or subsegmental filling
defects within the pulmonary arteries. The heart is normal in size.
No pericardial effusion or thickening. No evidence right heart
strain. There is normal three-vessel brachiocephalic anatomy without
proximal stenosis. The thoracic aorta is normal in appearance.

Mediastinum/Nodes: No hilar, mediastinal, or axillary adenopathy.
Thyroid gland, trachea, and esophagus demonstrate no significant
findings.

Lungs/Pleura: There is a small 4 mm pulmonary nodule seen in the
posterior right lower lung. There is a minimal amount of bibasilar
dependent atelectasis. No pleural effusion or pneumothorax. No
airspace consolidation.

Upper Abdomen: Partially visualized low-density lesions are seen
within both lobes of the liver.

Musculoskeletal: No chest wall abnormality. No acute or significant
osseous findings.

Review of the MIP images confirms the above findings.
IMPRESSION: 1. No central, segmental, or subsegmental pulmonary embolism.
2. No acute intrathoracic pathology to explain the patient's
symptoms.
3. 4 mm pulmonary nodule in the posterior right lower lobe. No
follow-up needed if patient is low-risk. Non-contrast chest CT can
be considered in 12 months if patient is high-risk. This
recommendation follows the consensus statement: Guidelines for
Management of Incidental Pulmonary Nodules Detected on CT Images:

## 2020-01-11 MED ORDER — SODIUM CHLORIDE 0.9 % IV SOLN
1.0000 g | Freq: Once | INTRAVENOUS | Status: AC
  Administered 2020-01-11: 1 g via INTRAVENOUS
  Filled 2020-01-11: qty 10

## 2020-01-11 MED ORDER — ONDANSETRON HCL 4 MG/2ML IJ SOLN: 4.0000 mg | Freq: Four times a day (QID) | INTRAMUSCULAR | Status: DC | PRN

## 2020-01-11 MED ORDER — ATORVASTATIN CALCIUM 10 MG PO TABS
10.0000 mg | ORAL_TABLET | ORAL | Status: DC
  Administered 2020-01-12: 10 mg via ORAL
  Filled 2020-01-11: qty 1

## 2020-01-11 MED ORDER — ONDANSETRON HCL 4 MG/2ML IJ SOLN
4.0000 mg | Freq: Once | INTRAMUSCULAR | Status: AC
  Administered 2020-01-11: 4 mg via INTRAVENOUS
  Filled 2020-01-11: qty 2

## 2020-01-11 MED ORDER — POLYETHYLENE GLYCOL 3350 17 G PO PACK: 17.0000 g | PACK | Freq: Every day | ORAL | Status: DC | PRN

## 2020-01-11 MED ORDER — HYDROXYZINE HCL 50 MG PO TABS
50.0000 mg | ORAL_TABLET | Freq: Three times a day (TID) | ORAL | Status: DC | PRN
  Filled 2020-01-11: qty 1

## 2020-01-11 MED ORDER — ONDANSETRON HCL 4 MG PO TABS: 4.0000 mg | ORAL_TABLET | Freq: Four times a day (QID) | ORAL | Status: DC | PRN

## 2020-01-11 MED ORDER — BUTALBITAL-APAP-CAFFEINE 50-325-40 MG PO TABS
2.0000 | ORAL_TABLET | Freq: Four times a day (QID) | ORAL | Status: DC | PRN
  Administered 2020-01-11: 2 via ORAL
  Filled 2020-01-11 (×2): qty 2

## 2020-01-11 MED ORDER — ENOXAPARIN SODIUM 40 MG/0.4ML ~~LOC~~ SOLN
40.0000 mg | SUBCUTANEOUS | Status: DC
  Administered 2020-01-11 – 2020-01-12 (×2): 40 mg via SUBCUTANEOUS
  Filled 2020-01-11 (×2): qty 0.4

## 2020-01-11 MED ORDER — SODIUM CHLORIDE 0.9% FLUSH
3.0000 mL | Freq: Two times a day (BID) | INTRAVENOUS | Status: DC
  Administered 2020-01-11 – 2020-01-12 (×2): 3 mL via INTRAVENOUS

## 2020-01-11 MED ORDER — DEXTROSE-NACL 5-0.45 % IV SOLN: INTRAVENOUS | Status: DC

## 2020-01-11 MED ORDER — ACETAMINOPHEN 650 MG RE SUPP: 650.0000 mg | Freq: Four times a day (QID) | RECTAL | Status: DC | PRN

## 2020-01-11 MED ORDER — PANTOPRAZOLE SODIUM 40 MG PO TBEC
40.0000 mg | DELAYED_RELEASE_TABLET | Freq: Every day | ORAL | Status: DC
  Administered 2020-01-11 – 2020-01-12 (×2): 40 mg via ORAL
  Filled 2020-01-11 (×2): qty 1

## 2020-01-11 MED ORDER — ACETAMINOPHEN 325 MG PO TABS
650.0000 mg | ORAL_TABLET | Freq: Four times a day (QID) | ORAL | Status: DC | PRN
  Administered 2020-01-11: 650 mg via ORAL
  Filled 2020-01-11: qty 2

## 2020-01-11 MED ORDER — IOHEXOL 350 MG/ML SOLN
75.0000 mL | Freq: Once | INTRAVENOUS | Status: AC | PRN
  Administered 2020-01-11: 75 mL via INTRAVENOUS

## 2020-01-11 MED ORDER — SODIUM CHLORIDE 0.9 % IV SOLN: INTRAVENOUS | Status: AC

## 2020-01-11 NOTE — ED Notes (Signed)
Patient assisted to the bathroom 

## 2020-01-11 NOTE — ED Provider Notes (Signed)
-----------------------------------------   2:05 AM on 01/11/2020 -----------------------------------------  Patient states she is feeling better, although nausea is returning.  Remains tachycardic in the 110s.  Updated patient and spouse on CT head and chest results.  Trace leukocytes in urine.  At this time will administer more IV fluids, check thyroid panel, administer IV Zofran and reassess.   ----------------------------------------- 4:48 AM on 01/11/2020 -----------------------------------------  Thyroid panel unremarkable.  After completion of second liter IV fluids, patient ambulated to the commode to use the restroom.  She was dizzy and unsteady on her feet.  Heart rate jumped up to 135.  Blood pressure went from 138/81-115/91.  At this time will discuss with hospitalist services for admission.   Paulette Blanch, MD 01/11/20 [id number]

## 2020-01-11 NOTE — Progress Notes (Signed)
Patient ID: Joyce Brock, female   DOB: 1965-08-01, 54 y.o.   MRN: [id number]   Patient seen and examined. Husband at bedside. Came in with nausea and significant headache. She had a syncopal episode that was unwitnessed. No tonic clonic seizures were bowel or urinary incontinence noted patient has been having a rough week on and off headache with lack of sleep and said couple stressors at home. Usually takes Excedrin as outpatient. She is in the process of getting neurologist to manage her migraine headache found to be tachycardic in the emergency room. Heart rate in the 110 no vomiting denies chest pain no cardiac history  Will start patient on fuel reset as needed. Cardiology consultation placed given syncopal episode at some chest discomfort yesterday with nausea and possible heart burns. Discussed with Dr. Stanford Breed cardiology to see patient.  Time 20 mins

## 2020-01-11 NOTE — Plan of Care (Signed)
  Problem: Education: Goal: Knowledge of General Education information will improve Description Including pain rating scale, medication(s)/side effects and non-pharmacologic comfort measures Outcome: Progressing   

## 2020-01-11 NOTE — H&P (Addendum)
History and Physical    Joyce Brock IOX:[id number] DOB: 1965/09/19 DOA: 01/10/2020  PCP: Jinny Sanders, MD   Patient coming from: Home   Chief Complaint: Lightheaded, syncope, chest discomfort   HPI: Joyce Brock is a 54 y.o. female with medical history significant for GERD, anxiety, and hyperlipidemia, now presenting to the emergency department after syncopal episode.  The patient reports that she had been in her usual state of health until about a week ago when she developed chest discomfort that she was attributing to indigestion.  She also began to experience lightheadedness, sometimes upon standing, and sometimes after she had been seated for a while.  Last night, she developed chest discomfort with nausea, became acutely lightheaded, and suffered a syncopal episode.  She had been sitting at the time.  Chest discomfort is described as a burning and tightness in the central chest.  She has had a mild headache but reports that she gets these frequently.  She traveled to the [facility] recently but denies any leg swelling or tenderness, denies shortness of breath, denies hemoptysis.  She reports family history of valvular disease in her father, but no history of sudden cardiac death.  ED Course: Upon arrival to the ED, patient is found to be afebrile, saturating well on room air, tachycardic to 130, and with stable blood pressure.  EKG features sinus tachycardia with borderline repolarization abnormality and borderline QT interval prolongation.  Chest x-ray was negative for acute cardiopulmonary disease, head CT negative for acute intracranial abnormality, and CTA chest negative for PE.  UDS is negative.  Thyroid studies are negative.  Patient had reportedly had negative orthostatics with EMS but did have a 20 mmHg drop in systolic blood pressure upon standing in the ED.  She was given more than a liter of saline in the ED in addition to 500 cc received with EMS but remains tachycardic.   Covid screening test not yet resulted.  Review of Systems:  All other systems reviewed and apart from HPI, are negative.  Past Medical History:  Diagnosis Date  . Allergy   . Hyperlipidemia     Past Surgical History:  Procedure Laterality Date  . CHOLECYSTECTOMY      Social History:   reports that she has quit smoking. She has never used smokeless tobacco. She reports that she does not drink alcohol and does not use drugs.  No Known Allergies  Family History  Problem Relation Age of Onset  . Osteoporosis Mother   . Irritable bowel syndrome Mother   . Cancer Sister 41       breast   . Stomach cancer Paternal Grandfather   . Colon cancer Neg Hx   . Esophageal cancer Neg Hx   . Rectal cancer Neg Hx      Prior to Admission medications   Medication Sig Start Date End Date Taking? Authorizing Provider  acetaminophen (TYLENOL) 500 MG tablet Take 1,000 mg by mouth every 6 (six) hours as needed.   Yes [provider]  atorvastatin (LIPITOR) 10 MG tablet TAKE 1 TABLET BY MOUTH DAILY. Patient taking differently: Take 10 mg by mouth every other day.  03/17/19  Yes Bedsole, Amy E, MD  Calcium Carbonate-Vitamin D (CALCIUM 600+D) 600-400 MG-UNIT tablet Take 2 tablets by mouth daily.   Yes [provider]  cetirizine (ALLERGY 24HOUR INDOOR/OUTDOOR) 10 MG tablet Take 10 mg by mouth daily.    Yes [provider]  fluticasone (FLONASE) 50 MCG/ACT nasal spray Place  1 spray into both nostrils daily.  11/20/16  Yes [provider]  hydrOXYzine (ATARAX/VISTARIL) 50 MG tablet Take 1 tablet (50 mg total) by mouth 3 (three) times daily as needed for anxiety or nausea. 10/04/18  Yes Bedsole, Amy E, MD  omeprazole (PRILOSEC OTC) 20 MG tablet Take 40 mg by mouth daily.   Yes [provider]  vitamin B-12 (CYANOCOBALAMIN) 1000 MCG tablet Take 1,000 mcg by mouth daily.   Yes [provider]  EPINEPHrine 0.3 mg/0.3 mL IJ SOAJ injection Inject into the  muscle. 01/02/20   [provider]    Physical Exam: Vitals:   01/11/20 0431 01/11/20 0501 01/11/20 0531 01/11/20 0600  BP: (!) 132/88 (!) 126/88 (!) 129/84 123/83  Pulse: (!) 118 (!) 115 (!) 115 (!) 122  Resp: 22 18 20 16   Temp:      TempSrc:      SpO2: 98% 97% 96% 97%  Weight:      Height:        Constitutional: NAD, calm  Eyes: PERTLA, lids and conjunctivae normal ENMT: Mucous membranes are moist. Posterior pharynx clear of any exudate or lesions.   Neck: normal, supple, no masses, no thyromegaly Respiratory: no wheezing, no crackles. No accessory muscle use.  Cardiovascular: Rate ~120 and regular. No extremity edema. No significant JVD. Abdomen: No distension, no tenderness, soft. Bowel sounds active.  Musculoskeletal: no clubbing / cyanosis. No joint deformity upper and lower extremities.   Skin: no significant rashes, lesions, ulcers. Warm, dry, well-perfused. Neurologic: CN 2-12 grossly intact. Sensation intact. Moving all extremities.  Psychiatric: Alert and oriented to person, place, and situation. Pleasant and cooperative.    Labs and Imaging on Admission: I have personally reviewed following labs and imaging studies  CBC: Recent Labs  Lab 01/10/20 2225  WBC 8.4  NEUTROABS 7.6  HGB 13.9  HCT 41.3  MCV 87.9  PLT 341   Basic Metabolic Panel: Recent Labs  Lab 01/10/20 2225  NA 140  K 3.8  CL 107  CO2 27  GLUCOSE 131*  BUN 12  CREATININE 0.86  CALCIUM 8.2*   GFR: Estimated Creatinine Clearance: 77.1 mL/min (by C-G formula based on SCr of 0.86 mg/dL). Liver Function Tests: No results for input(s): AST, ALT, ALKPHOS, BILITOT, PROT, ALBUMIN in the last 168 hours. No results for input(s): LIPASE, AMYLASE in the last 168 hours. No results for input(s): AMMONIA in the last 168 hours. Coagulation Profile: No results for input(s): INR, PROTIME in the last 168 hours. Cardiac Enzymes: No results for input(s): CKTOTAL, CKMB, CKMBINDEX, TROPONINI in  the last 168 hours. BNP (last 3 results) No results for input(s): PROBNP in the last 8760 hours. HbA1C: No results for input(s): HGBA1C in the last 72 hours. CBG: No results for input(s): GLUCAP in the last 168 hours. Lipid Profile: No results for input(s): CHOL, HDL, LDLCALC, TRIG, CHOLHDL, LDLDIRECT in the last 72 hours. Thyroid Function Tests: Recent Labs    01/11/20 0038  TSH 0.864  FREET4 0.95   Anemia Panel: No results for input(s): VITAMINB12, FOLATE, FERRITIN, TIBC, IRON, RETICCTPCT in the last 72 hours. Urine analysis:    Component Value Date/Time   COLORURINE STRAW (A) 01/11/2020 0103   APPEARANCEUR CLEAR (A) 01/11/2020 0103   LABSPEC 1.020 01/11/2020 0103   PHURINE 7.0 01/11/2020 0103   GLUCOSEU NEGATIVE 01/11/2020 0103   HGBUR NEGATIVE 01/11/2020 0103   HGBUR negative 10/30/2008 [place] 01/11/2020 0103   BILIRUBINUR Negative 04/29/2019 0957  KETONESUR NEGATIVE 01/11/2020 0103   PROTEINUR NEGATIVE 01/11/2020 0103   UROBILINOGEN 0.2 04/29/2019 0957   UROBILINOGEN 0.2 10/30/2008 1350   NITRITE NEGATIVE 01/11/2020 0103   LEUKOCYTESUR TRACE (A) 01/11/2020 0103   Sepsis Labs: @LABRCNTIP (procalcitonin:4,lacticidven:4) )No results found for this or any previous visit (from the past 240 hour(s)).   Radiological Exams on Admission: CT Head Wo Contrast  Result Date: 01/10/2020 CLINICAL DATA:  Syncopal episodes EXAM: CT HEAD WITHOUT CONTRAST TECHNIQUE: Contiguous axial images were obtained from the base of the skull through the vertex without intravenous contrast. COMPARISON:  None. FINDINGS: Brain: No evidence of acute infarction, hemorrhage, hydrocephalus, extra-axial collection or mass lesion/mass effect. Vascular: No hyperdense vessel or unexpected calcification. Skull: Normal. Negative for fracture or focal lesion. Sinuses/Orbits: No acute finding. Other: None. IMPRESSION: No acute intracranial abnormality noted. Electronically Signed   By: Inez Catalina M.D.   On: 01/10/2020 23:33   CT Angio Chest PE W and/or Wo Contrast  Result Date: 01/11/2020 CLINICAL DATA:  Syncope EXAM: CT ANGIOGRAPHY CHEST WITH CONTRAST TECHNIQUE: Multidetector CT imaging of the chest was performed using the standard protocol during bolus administration of intravenous contrast. Multiplanar CT image reconstructions and MIPs were obtained to evaluate the vascular anatomy. CONTRAST:  29mL OMNIPAQUE IOHEXOL 350 MG/ML SOLN COMPARISON:  None. FINDINGS: Cardiovascular: There is a optimal opacification of the pulmonary arteries. There is no central,segmental, or subsegmental filling defects within the pulmonary arteries. The heart is normal in size. No pericardial effusion or thickening. No evidence right heart strain. There is normal three-vessel brachiocephalic anatomy without proximal stenosis. The thoracic aorta is normal in appearance. Mediastinum/Nodes: No hilar, mediastinal, or axillary adenopathy. Thyroid gland, trachea, and esophagus demonstrate no significant findings. Lungs/Pleura: There is a small 4 mm pulmonary nodule seen in the posterior right lower lung. There is a minimal amount of bibasilar dependent atelectasis. No pleural effusion or pneumothorax. No airspace consolidation. Upper Abdomen: Partially visualized low-density lesions are seen within both lobes of the liver. Musculoskeletal: No chest wall abnormality. No acute or significant osseous findings. Review of the MIP images confirms the above findings. IMPRESSION: 1. No central, segmental, or subsegmental pulmonary embolism. 2. No acute intrathoracic pathology to explain the patient's symptoms. 3. 4 mm pulmonary nodule in the posterior right lower lobe. No follow-up needed if patient is low-risk. Non-contrast chest CT can be considered in 12 months if patient is high-risk. This recommendation follows the consensus statement: Guidelines for Management of Incidental Pulmonary Nodules Detected on CT Images: From the  Fleischner Society 2017; Radiology 2017; 284:228-243. Electronically Signed   By: Prudencio Pair M.D.   On: 01/11/2020 00:28   DG Chest Port 1 View  Result Date: 01/11/2020 CLINICAL DATA:  Syncope EXAM: PORTABLE CHEST 1 VIEW COMPARISON:  None. FINDINGS: The heart size and mediastinal contours are within normal limits. Both lungs are clear. The visualized skeletal structures are unremarkable. IMPRESSION: No active disease. Electronically Signed   By: Prudencio Pair M.D.   On: 01/11/2020 00:03    EKG: Independently reviewed. Sinus tachycardia (rate 116), QRS 73, QTc 494 ms.  Assessment/Plan   1. Syncope  - Presents with syncope while seated associated with nausea, recent presyncopal episodes, and chest discomfort  - She is persistently tachycardic in ED with normal thyroid studies and negative CTA chest, does not appear hypovolemic and has not significantly improved with IVF  - EKG with sinus tachycardia and borderline repol abn and QT interval  - Continue cardiac monitoring, continue IVF hydration for now  and repeat orthostatic vitals, and check echocardiogram    2. GERD  - Continue PPI    3. Anxiety  - Continue as-needed Atarax    4. Hyperlipidemia  - Continue Lipitor    5. Lung nodule  - Pulmonary nodule noted incidentally on CT in ED  - Patient has quit smoking  - Outpatient follow-up recommended    DVT prophylaxis: Lovenox  Code Status: Full  Family Communication: Husband updated at bedside Disposition Plan:  Patient is from: home  Anticipated d/c is to: Home  Anticipated d/c date is: 1-2 days  Patient currently: Pending echocardiogram, cardiac monitoring Consults called: None  Admission status: Observation     Vianne Bulls, MD [facility]  01/11/2020, 6:29 AM

## 2020-01-11 NOTE — Consult Note (Signed)
Cardiology Consultation:   Patient ID: Joyce Brock MRN: [id number]; DOB: 16-Oct-1965  Admit date: 01/10/2020 Date of Consult: 01/11/2020  Primary Care Provider: Jinny Sanders, MD [facility] Cardiologist: New   Patient Profile:   Joyce Brock is a 55 y.o. female with a hx of GERD, anxiety, hyperlipidemia who is being seen today for the evaluation of syncope at the request of Fritzi Mandes, MD.  History of Present Illness:   Stress echocardiogram 2017 normal.  Patient typically denies dyspnea on exertion, orthopnea, PND, pedal edema, palpitations or exertional chest pain.  Over the past 1-1/2 years she has had difficulties with nausea and anxiety by her report.  She also is having headaches.  Yesterday she was having problems with nausea and decreased p.o. intake.  She states that she was sitting on her couch and developed severe nausea, diaphoresis and feelings of "warmth".  She then had frank syncope for approximately 10 to 15 seconds.  There was no seizure activity or incontinence.  No preceding chest pain, dyspnea or palpitations.  She has been admitted and cardiology asked to evaluate.   Past Medical History:  Diagnosis Date  . Allergy   . Anxiety   . GERD (gastroesophageal reflux disease)   . Hyperlipidemia   . Migraines     Past Surgical History:  Procedure Laterality Date  . CHOLECYSTECTOMY       Inpatient Medications: Scheduled Meds: . [START ON 01/12/2020] atorvastatin  10 mg Oral QODAY  . enoxaparin (LOVENOX) injection  40 mg Subcutaneous Q24H  . pantoprazole  40 mg Oral Daily  . sodium chloride flush  3 mL Intravenous Q12H   Continuous Infusions: . sodium chloride 125 mL/hr at 01/11/20 1012   PRN Meds: acetaminophen **OR** acetaminophen, butalbital-acetaminophen-caffeine, hydrOXYzine, ondansetron **OR** ondansetron (ZOFRAN) IV, polyethylene glycol  Allergies:   No Known Allergies  Social History:   Social History   Socioeconomic History  .  Marital status: Married    Spouse name: Not on file  . Number of children: 2  . Years of education: Not on file  . Highest education level: Not on file  Occupational History  . Not on file  Tobacco Use  . Smoking status: Former Research scientist (life sciences)  . Smokeless tobacco: Never Used  Vaping Use  . Vaping Use: Never used  Substance and Sexual Activity  . Alcohol use: Yes    Comment: Rare  . Drug use: No  . Sexual activity: Not on file  Other Topics Concern  . Not on file  Social History Narrative  . Not on file   Social Determinants of Health   Financial Resource Strain:   . Difficulty of Paying Living Expenses:   Food Insecurity:   . Worried About Charity fundraiser in the Last Year:   . Arboriculturist in the Last Year:   Transportation Needs:   . Film/video editor (Medical):   Marland Kitchen Lack of Transportation (Non-Medical):   Physical Activity:   . Days of Exercise per Week:   . Minutes of Exercise per Session:   Stress:   . Feeling of Stress :   Social Connections:   . Frequency of Communication with Friends and Family:   . Frequency of Social Gatherings with Friends and Family:   . Attends Religious Services:   . Active Member of Clubs or Organizations:   . Attends Archivist Meetings:   Marland Kitchen Marital Status:   Intimate Partner Violence:   . Fear of Current  or Ex-Partner:   . Emotionally Abused:   Marland Kitchen Physically Abused:   . Sexually Abused:     Family History:    Family History  Problem Relation Age of Onset  . Osteoporosis Mother   . Irritable bowel syndrome Mother   . Cancer Sister 36       breast   . Stomach cancer Paternal Grandfather   . Colon cancer Neg Hx   . Esophageal cancer Neg Hx   . Rectal cancer Neg Hx      ROS:  Please see the history of present illness.  Headaches, nausea and Gastrosoft reflux disease but no fevers, chills or productive cough. All other ROS reviewed and negative.     Physical Exam/Data:   Vitals:   01/11/20 0628 01/11/20  0700 01/11/20 1009 01/11/20 1024  BP: (!) 134/93 (!) 127/86 122/82 (!) 129/80  Pulse: (!) 117 (!) 116 (!) 109 (!) 108  Resp: (!) 27 22 21 18   Temp:   99.1 F (37.3 C) 99.1 F (37.3 C)  TempSrc:   Oral   SpO2: 97% 97% 97% 95%  Weight:      Height:        Intake/Output Summary (Last 24 hours) at 01/11/2020 1152 Last data filed at 01/11/2020 0302 Gross per 24 hour  Intake 1600 ml  Output 900 ml  Net 700 ml   Last 3 Weights 01/10/2020 04/29/2019 02/07/2019  Weight (lbs) 164 lb 153 lb 4 oz 152 lb 12 oz  Weight (kg) 74.39 kg 69.514 kg 69.287 kg     Body mass index is 26.47 kg/m.  General:  Well nourished, well developed, in no acute distress HEENT: normal Lymph: no adenopathy Neck: no JVD Endocrine:  No thryomegaly Vascular: No carotid bruits; FA pulses 2+ bilaterally without bruits  Cardiac:  normal S1, S2; RRR; no murmur  Lungs:  clear to auscultation bilaterally, no wheezing, rhonchi or rales  Abd: soft, nontender, no hepatomegaly  Ext: no edema Musculoskeletal:  No deformities, BUE and BLE strength normal and equal Skin: warm and dry  Neuro:  CNs 2-12 intact, no focal abnormalities noted Psych:  Normal affect   EKG:  The EKG was personally reviewed and demonstrates: Sinus tachycardia, nonspecific T wave changes, prolonged QT interval Telemetry:  Telemetry was personally reviewed and demonstrates: Sinus tachycardia  Laboratory Data:  High Sensitivity Troponin:   Recent Labs  Lab 01/10/20 2225 01/11/20 0038  TROPONINIHS <2 <2     Chemistry Recent Labs  Lab 01/10/20 2225  NA 140  K 3.8  CL 107  CO2 27  GLUCOSE 131*  BUN 12  CREATININE 0.86  CALCIUM 8.2*  GFRNONAA >60  GFRAA >60  ANIONGAP 6    Hematology Recent Labs  Lab 01/10/20 2225  WBC 8.4  RBC 4.70  HGB 13.9  HCT 41.3  MCV 87.9  MCH 29.6  MCHC 33.7  RDW 12.7  PLT 156    Radiology/Studies:  CT Head Wo Contrast  Result Date: 01/10/2020 CLINICAL DATA:  Syncopal episodes EXAM: CT HEAD  WITHOUT CONTRAST TECHNIQUE: Contiguous axial images were obtained from the base of the skull through the vertex without intravenous contrast. COMPARISON:  None. FINDINGS: Brain: No evidence of acute infarction, hemorrhage, hydrocephalus, extra-axial collection or mass lesion/mass effect. Vascular: No hyperdense vessel or unexpected calcification. Skull: Normal. Negative for fracture or focal lesion. Sinuses/Orbits: No acute finding. Other: None. IMPRESSION: No acute intracranial abnormality noted. Electronically Signed   By: Inez Catalina M.D.   On: 01/10/2020  23:33   CT Angio Chest PE W and/or Wo Contrast  Result Date: 01/11/2020 CLINICAL DATA:  Syncope EXAM: CT ANGIOGRAPHY CHEST WITH CONTRAST TECHNIQUE: Multidetector CT imaging of the chest was performed using the standard protocol during bolus administration of intravenous contrast. Multiplanar CT image reconstructions and MIPs were obtained to evaluate the vascular anatomy. CONTRAST:  40mL OMNIPAQUE IOHEXOL 350 MG/ML SOLN COMPARISON:  None. FINDINGS: Cardiovascular: There is a optimal opacification of the pulmonary arteries. There is no central,segmental, or subsegmental filling defects within the pulmonary arteries. The heart is normal in size. No pericardial effusion or thickening. No evidence right heart strain. There is normal three-vessel brachiocephalic anatomy without proximal stenosis. The thoracic aorta is normal in appearance. Mediastinum/Nodes: No hilar, mediastinal, or axillary adenopathy. Thyroid gland, trachea, and esophagus demonstrate no significant findings. Lungs/Pleura: There is a small 4 mm pulmonary nodule seen in the posterior right lower lung. There is a minimal amount of bibasilar dependent atelectasis. No pleural effusion or pneumothorax. No airspace consolidation. Upper Abdomen: Partially visualized low-density lesions are seen within both lobes of the liver. Musculoskeletal: No chest wall abnormality. No acute or significant osseous  findings. Review of the MIP images confirms the above findings. IMPRESSION: 1. No central, segmental, or subsegmental pulmonary embolism. 2. No acute intrathoracic pathology to explain the patient's symptoms. 3. 4 mm pulmonary nodule in the posterior right lower lobe. No follow-up needed if patient is low-risk. Non-contrast chest CT can be considered in 12 months if patient is high-risk. This recommendation follows the consensus statement: Guidelines for Management of Incidental Pulmonary Nodules Detected on CT Images: From the Fleischner Society 2017; Radiology 2017; 284:228-243. Electronically Signed   By: Prudencio Pair M.D.   On: 01/11/2020 00:28   DG Chest Port 1 View  Result Date: 01/11/2020 CLINICAL DATA:  Syncope EXAM: PORTABLE CHEST 1 VIEW COMPARISON:  None. FINDINGS: The heart size and mediastinal contours are within normal limits. Both lungs are clear. The visualized skeletal structures are unremarkable. IMPRESSION: No active disease. Electronically Signed   By: Prudencio Pair M.D.   On: 01/11/2020 00:03    Assessment and Plan:   1. Syncope-episode sounds likely to be vagal in etiology with possible contribution from dehydration.  Would continue telemetry.  We will arrange echocardiogram to assess LV function.  If no significant arrhythmias and LV function normal could likely be discharged tomorrow morning.  Could consider a monitor in the future if she has recurrent episodes. 2. Prolonged QT interval-as outlined above description sounds likely to be vagal in origin.  We will repeat ECG tomorrow morning. 3. Hyperlipidemia-continue statin. 4. Lung nodule-will need fu CT with primary care. 5. Nausea/vomiting-has been longstanding.  She attributes this to anxiety.  May need further work-up per primary care.  For questions or updates, please contact [place] Please consult [email or web address] for contact info under    Signed, Kirk Ruths, MD  01/11/2020 11:52 AM

## 2020-01-12 ENCOUNTER — Telehealth: Payer: Self-pay

## 2020-01-12 DIAGNOSIS — R000 Tachycardia, unspecified: Secondary | ICD-10-CM

## 2020-01-12 DIAGNOSIS — R55 Syncope and collapse: Secondary | ICD-10-CM

## 2020-01-12 LAB — BASIC METABOLIC PANEL
Anion gap: 5 (ref 5–15)
BUN: 11 mg/dL (ref 6–20)
CO2: 29 mmol/L (ref 22–32)
Calcium: 8.5 mg/dL — ABNORMAL LOW (ref 8.9–10.3)
Chloride: 106 mmol/L (ref 98–111)
Creatinine, Ser: 0.81 mg/dL (ref 0.44–1.00)
GFR calc Af Amer: >60 mL/min (ref >60)
GFR calc non Af Amer: >60 mL/min (ref >60)
Glucose, Bld: 116 mg/dL — ABNORMAL HIGH (ref 70–99)
Potassium: 3.4 mmol/L — ABNORMAL LOW (ref 3.5–5.1)
Sodium: 140 mmol/L (ref 135–145)

## 2020-01-12 LAB — HIV ANTIBODY (ROUTINE TESTING W REFLEX): HIV Screen 4th Generation wRfx: NONREACTIVE

## 2020-01-12 LAB — GLUCOSE, CAPILLARY: Glucose-Capillary: 90 mg/dL (ref 70–99)

## 2020-01-12 LAB — URINE CULTURE: Culture: <10000 — AB

## 2020-01-12 NOTE — Telephone Encounter (Addendum)
Per JV the pt should have an in office appt and the zio placed during the visit. Msg fwd to scheduling to call the patient to schedule a post hospital f/u.  Order placed for Zio AT to be mailed to the patient. Message fwd to scheduling to contact the patient to schedule a 5-6 wk in office visit.

## 2020-01-12 NOTE — Discharge Summary (Signed)
[facility] NAME: Joyce Brock    MR#:  [id number]  DATE OF BIRTH:  Mar 03, 1966  DATE OF ADMISSION:  01/10/2020 ADMITTING PHYSICIAN: Vianne Bulls, MD  DATE OF DISCHARGE: 01/12/2020  PRIMARY CARE PHYSICIAN: Jinny Sanders, MD    ADMISSION DIAGNOSIS:  Lower urinary tract infectious disease [N39.0] Syncope [R55] Orthostasis [I95.1] Pulmonary nodule [R91.1] Syncope, unspecified syncope type [R55]  DISCHARGE DIAGNOSIS:  Syncope suspected due Vagal response Clinical Dehydration--improved Tachycardia Migraine HA SECONDARY DIAGNOSIS:   Past Medical History:  Diagnosis Date  . Allergy   . Anxiety   . GERD (gastroesophageal reflux disease)   . Hyperlipidemia   . Migraines     HOSPITAL COURSE:  Joyce Brock is a 54 y.o. female with medical history significant for GERD, anxiety, and hyperlipidemia, now presenting to the emergency department after syncopal episode.  The patient reports that she had been in her usual state of health until about a week ago when she developed chest discomfort that she was attributing to indigestion.  She also began to experience lightheadedness 1. Syncope  - Presents with syncope while seated associated with nausea, recent presyncopal episodes, and chest discomfort --suspected vasovagal - She was persistently tachycardic in ED with normal thyroid studies and negative CTA chest, now improved - EKG with sinus tachycardia and borderline repol abn and QT interval  - received IV fluid   -patient seen by cardiology [place] MG-- suspected vasovagal episode in the setting of dehydration, migraine headache and few stressors last week.  -Echo wnl -Patient feels a whole lot better today. Headache resolved. -HR 100's--asymptomatic  2. GERD  -cont PPI  3. Anxiety  - Continue as-needed Atarax    4. Hyperlipidemia  - Continue Lipitor    5. Lung nodule  - Pulmonary nodule noted incidentally on CT in  ED  - Patient has quit smoking  - Outpatient follow-up recommended --defer to PCP   DVT prophylaxis: Lovenox  Code Status: Full  Family Communication: Husband updated at bedside Disposition Plan:  Patient is from: home  Anticipated d/c is to: Home  Anticipated d/c date UX:LKGMW Patient currently: stable for d/c Consults called: None  Admission status: inpatient  CONSULTS OBTAINED:  Treatment Team:  Lelon Perla, MD Wellington Hampshire, MD  DRUG ALLERGIES:  No Known Allergies  DISCHARGE MEDICATIONS:   Allergies as of 01/12/2020   No Known Allergies     Medication List    TAKE these medications   acetaminophen 500 MG tablet Commonly known as: TYLENOL Take 1,000 mg by mouth every 6 (six) hours as needed.   Allergy 24Hour Indoor/Outdoor 10 MG tablet Generic drug: cetirizine Take 10 mg by mouth daily.   atorvastatin 10 MG tablet Commonly known as: LIPITOR TAKE 1 TABLET BY MOUTH DAILY. What changed: when to take this   Calcium 600+D 600-400 MG-UNIT tablet Generic drug: Calcium Carbonate-Vitamin D Take 2 tablets by mouth daily.   EPINEPHrine 0.3 mg/0.3 mL Soaj injection Commonly known as: EPI-PEN Inject into the muscle.   fluticasone 50 MCG/ACT nasal spray Commonly known as: FLONASE Place 1 spray into both nostrils daily.   hydrOXYzine 50 MG tablet Commonly known as: ATARAX/VISTARIL Take 1 tablet (50 mg total) by mouth 3 (three) times daily as needed for anxiety or nausea.   omeprazole 20 MG tablet Commonly known as: PRILOSEC OTC Take 40 mg by mouth daily.   vitamin B-12 1000 MCG tablet Commonly known as: CYANOCOBALAMIN Take 1,000  mcg by mouth daily.       If you experience worsening of your admission symptoms, develop shortness of breath, life threatening emergency, suicidal or homicidal thoughts you must seek medical attention immediately by calling 911 or calling your MD immediately  if symptoms less severe.  You Must read complete  instructions/literature along with all the possible adverse reactions/side effects for all the Medicines you take and that have been prescribed to you. Take any new Medicines after you have completely understood and accept all the possible adverse reactions/side effects.   Please note  You were cared for by a hospitalist during your hospital stay. If you have any questions about your discharge medications or the care you received while you were in the hospital after you are discharged, you can call the unit and asked to speak with the hospitalist on call if the hospitalist that took care of you is not available. Once you are discharged, your primary care physician will handle any further medical issues. Please note that NO REFILLS for any discharge medications will be authorized once you are discharged, as it is imperative that you return to your primary care physician (or establish a relationship with a primary care physician if you do not have one) for your aftercare needs so that they can reassess your need for medications and monitor your lab values. Today   SUBJECTIVE   Doing well feels better. HA resolved ambulated well in the hallways. Heart rate remains in the hundreds. Patient asymptomatic.  VITAL SIGNS:  Blood pressure 120/82, pulse 88, temperature 98.9 F (37.2 C), temperature source Oral, resp. rate 16, height 5\' 6"  (1.676 m), weight 75.1 kg, last menstrual period 03/23/2013, SpO2 96 %.  I/O:    Intake/Output Summary (Last 24 hours) at 01/12/2020 0952 Last data filed at 01/11/2020 1841 Gross per 24 hour  Intake 827.69 ml  Output --  Net 827.69 ml    PHYSICAL EXAMINATION:  GENERAL:  54 y.o.-year-old patient lying in the bed with no acute distress.  EYES: Pupils equal, round, reactive to light and accommodation. No scleral icterus.  HEENT: Head atraumatic, normocephalic. Oropharynx and nasopharynx clear.  NECK:  Supple, no jugular venous distention. No thyroid enlargement, no  tenderness.  LUNGS: Normal breath sounds bilaterally, no wheezing, rales,rhonchi or crepitation. No use of accessory muscles of respiration.  CARDIOVASCULAR: S1, S2 normal. No murmurs, rubs, or gallops.  ABDOMEN: Soft, non-tender, non-distended. Bowel sounds present. No organomegaly or mass.  EXTREMITIES: No pedal edema, cyanosis, or clubbing.  NEUROLOGIC: Cranial nerves II through XII are intact. Muscle strength 5/5 in all extremities. Sensation intact. Gait not checked.  PSYCHIATRIC: The patient is alert and oriented x 3.  SKIN: No obvious rash, lesion, or ulcer.   DATA REVIEW:   CBC  Recent Labs  Lab 01/10/20 2225  WBC 8.4  HGB 13.9  HCT 41.3  PLT 156    Chemistries  Recent Labs  Lab 01/10/20 2225  NA 140  K 3.8  CL 107  CO2 27  GLUCOSE 131*  BUN 12  CREATININE 0.86  CALCIUM 8.2*    Microbiology Results   Recent Results (from the past 240 hour(s))  Urine culture     Status: Abnormal   Collection Time: 01/11/20  1:03 AM   Specimen: Urine, Random  Result Value Ref Range Status   Specimen Description   Final    URINE, RANDOM Performed at [facility], [address] [place], [place] [address]    Special Requests  Final    NONE Performed at [facility], [address] [place], [place] [address]    Culture (A)  Final    <10,000 COLONIES/mL INSIGNIFICANT GROWTH Performed at [facility] [address], [place], [place] [address]    Report Status 01/12/2020 FINAL  Final  SARS Coronavirus 2 by RT PCR (hospital order, performed in [facility] hospital lab) Nasopharyngeal Nasopharyngeal Swab     Status: None   Collection Time: 01/11/20  5:51 AM   Specimen: Nasopharyngeal Swab  Result Value Ref Range Status   SARS Coronavirus 2 NEGATIVE NEGATIVE Final    Comment: (NOTE) SARS-CoV-2 target nucleic acids are NOT DETECTED.  The SARS-CoV-2 RNA is generally detectable in upper and lower respiratory specimens during the acute phase of  infection. The lowest concentration of SARS-CoV-2 viral copies this assay can detect is 250 copies / mL. A negative result does not preclude SARS-CoV-2 infection and should not be used as the sole basis for treatment or other patient management decisions.  A negative result may occur with improper specimen collection / handling, submission of specimen other than nasopharyngeal swab, presence of viral mutation(s) within the areas targeted by this assay, and inadequate number of viral copies (<250 copies / mL). A negative result must be combined with clinical observations, patient history, and epidemiological information.  Fact Sheet for Patients:   StrictlyIdeas.no  Fact Sheet for Healthcare Providers: BankingDealers.co.za  This test is not yet approved or  cleared by the [place] FDA and has been authorized for detection and/or diagnosis of SARS-CoV-2 by FDA under an Emergency Use Authorization (EUA).  This EUA will remain in effect (meaning this test can be used) for the duration of the COVID-19 declaration under Section 564(b)(1) of the Act, 21 U.S.C. section 360bbb-3(b)(1), unless the authorization is terminated or revoked sooner.  Performed at [facility], [address] [place], [place] [address]     RADIOLOGY:  CT Head Wo Contrast  Result Date: 01/10/2020 CLINICAL DATA:  Syncopal episodes EXAM: CT HEAD WITHOUT CONTRAST TECHNIQUE: Contiguous axial images were obtained from the base of the skull through the vertex without intravenous contrast. COMPARISON:  None. FINDINGS: Brain: No evidence of acute infarction, hemorrhage, hydrocephalus, extra-axial collection or mass lesion/mass effect. Vascular: No hyperdense vessel or unexpected calcification. Skull: Normal. Negative for fracture or focal lesion. Sinuses/Orbits: No acute finding. Other: None. IMPRESSION: No acute intracranial abnormality noted. Electronically  Signed   By: Inez Catalina M.D.   On: 01/10/2020 23:33   CT Angio Chest PE W and/or Wo Contrast  Result Date: 01/11/2020 CLINICAL DATA:  Syncope EXAM: CT ANGIOGRAPHY CHEST WITH CONTRAST TECHNIQUE: Multidetector CT imaging of the chest was performed using the standard protocol during bolus administration of intravenous contrast. Multiplanar CT image reconstructions and MIPs were obtained to evaluate the vascular anatomy. CONTRAST:  3mL OMNIPAQUE IOHEXOL 350 MG/ML SOLN COMPARISON:  None. FINDINGS: Cardiovascular: There is a optimal opacification of the pulmonary arteries. There is no central,segmental, or subsegmental filling defects within the pulmonary arteries. The heart is normal in size. No pericardial effusion or thickening. No evidence right heart strain. There is normal three-vessel brachiocephalic anatomy without proximal stenosis. The thoracic aorta is normal in appearance. Mediastinum/Nodes: No hilar, mediastinal, or axillary adenopathy. Thyroid gland, trachea, and esophagus demonstrate no significant findings. Lungs/Pleura: There is a small 4 mm pulmonary nodule seen in the posterior right lower lung. There is a minimal amount of bibasilar dependent atelectasis. No pleural effusion or pneumothorax. No  airspace consolidation. Upper Abdomen: Partially visualized low-density lesions are seen within both lobes of the liver. Musculoskeletal: No chest wall abnormality. No acute or significant osseous findings. Review of the MIP images confirms the above findings. IMPRESSION: 1. No central, segmental, or subsegmental pulmonary embolism. 2. No acute intrathoracic pathology to explain the patient's symptoms. 3. 4 mm pulmonary nodule in the posterior right lower lobe. No follow-up needed if patient is low-risk. Non-contrast chest CT can be considered in 12 months if patient is high-risk. This recommendation follows the consensus statement: Guidelines for Management of Incidental Pulmonary Nodules Detected on CT  Images: From the Fleischner Society 2017; Radiology 2017; 284:228-243. Electronically Signed   By: Prudencio Pair M.D.   On: 01/11/2020 00:28   DG Chest Port 1 View  Result Date: 01/11/2020 CLINICAL DATA:  Syncope EXAM: PORTABLE CHEST 1 VIEW COMPARISON:  None. FINDINGS: The heart size and mediastinal contours are within normal limits. Both lungs are clear. The visualized skeletal structures are unremarkable. IMPRESSION: No active disease. Electronically Signed   By: Prudencio Pair M.D.   On: 01/11/2020 00:03   ECHOCARDIOGRAM COMPLETE  Result Date: 01/11/2020    ECHOCARDIOGRAM REPORT   Patient Name:   Joyce Brock Date of Exam: 01/11/2020 Medical Rec #:  [id number]          Height:       66.0 in Accession #:    [id number]         Weight:       164.0 lb Date of Birth:  [birth year]          BSA:          1.838 m Patient Age:    31 years           BP:           129/80 mmHg Patient Gender: F                  HR:           90 bpm. Exam Location:  [facility] Procedure: 2D Echo Indications:     Syncope 780.2/R55  History:         Patient has prior history of Echocardiogram examinations, most                  recent 01/20/2016. Signs/Symptoms:Syncope; Risk                  Factors:Dyslipidemia. Palpitations.  Sonographer:     Avanell Shackleton Referring Phys:  [id number] TIMOTHY S OPYD Diagnosing Phys: Kirk Ruths MD IMPRESSIONS  1. Left ventricular ejection fraction, by estimation, is 60 to 65%. The left ventricle has normal function. The left ventricle has no regional wall motion abnormalities. Left ventricular diastolic parameters are consistent with Grade I diastolic dysfunction (impaired relaxation).  2. Right ventricular systolic function is normal. The right ventricular size is normal. Tricuspid regurgitation signal is inadequate for assessing PA pressure.  3. The mitral valve is normal in structure. No evidence of mitral valve regurgitation. No evidence of mitral stenosis.  4. The aortic valve has an indeterminant number  of cusps. Aortic valve regurgitation is not visualized. No aortic stenosis is present.  5. The inferior vena cava is normal in size with greater than 50% respiratory variability, suggesting right atrial pressure of 3 mmHg. FINDINGS  Left Ventricle: Left ventricular ejection fraction, by estimation, is 60 to 65%. The left ventricle has normal function. The left ventricle has no regional wall motion abnormalities.  The left ventricular internal cavity size was normal in size. There is  no left ventricular hypertrophy. Left ventricular diastolic parameters are consistent with Grade I diastolic dysfunction (impaired relaxation). Right Ventricle: The right ventricular size is normal.Right ventricular systolic function is normal. Tricuspid regurgitation signal is inadequate for assessing PA pressure. The tricuspid regurgitant velocity is 2.21 m/s, and with an assumed right atrial pressure of 10 mmHg, the estimated right ventricular systolic pressure is 67.6 mmHg. Left Atrium: Left atrial size was normal in size. Right Atrium: Right atrial size was normal in size. Pericardium: There is no evidence of pericardial effusion. Mitral Valve: The mitral valve is normal in structure. Normal mobility of the mitral valve leaflets. No evidence of mitral valve regurgitation. No evidence of mitral valve stenosis. Tricuspid Valve: The tricuspid valve is normal in structure. Tricuspid valve regurgitation is trivial. No evidence of tricuspid stenosis. Aortic Valve: The aortic valve has an indeterminant number of cusps. Aortic valve regurgitation is not visualized. No aortic stenosis is present. Pulmonic Valve: The pulmonic valve was normal in structure. Pulmonic valve regurgitation is trivial. No evidence of pulmonic stenosis. Aorta: The aortic root is normal in size and structure. Venous: The inferior vena cava is normal in size with greater than 50% respiratory variability, suggesting right atrial pressure of 3 mmHg. IAS/Shunts: No  atrial level shunt detected by color flow Doppler.  LEFT VENTRICLE PLAX 2D LVIDd:         3.74 cm  Diastology LVIDs:         2.53 cm  LV e' lateral:   11.10 cm/s LV PW:         0.87 cm  LV E/e' lateral: 7.8 LV IVS:        1.06 cm  LV e' medial:    6.74 cm/s LVOT diam:     1.70 cm  LV E/e' medial:  12.9 LVOT Area:     2.27 cm  RIGHT VENTRICLE         IVC TAPSE (M-mode): 2.0 cm  IVC diam: 1.63 cm LEFT ATRIUM             Index       RIGHT ATRIUM           Index LA diam:        3.40 cm 1.85 cm/m  RA Area:     12.30 cm LA Vol (A2C):   31.7 ml 17.25 ml/m RA Volume:   27.30 ml  14.85 ml/m LA Vol (A4C):   35.5 ml 19.31 ml/m LA Biplane Vol: 35.7 ml 19.42 ml/m   AORTA Ao Root diam: 2.70 cm MITRAL VALVE                TRICUSPID VALVE MV Area (PHT): 3.81 cm     TR Peak grad:   19.5 mmHg MV Decel Time: 199 msec     TR Vmax:        221.00 cm/s MV E velocity: 87.00 cm/s MV A velocity: 101.00 cm/s  SHUNTS MV E/A ratio:  0.86         Systemic Diam: 1.70 cm Kirk Ruths MD Electronically signed by Kirk Ruths MD Signature Date/Time: 01/11/2020/2:55:34 PM    Final      CODE STATUS:     Code Status Orders  (From admission, onward)         Start     Ordered   01/11/20 0955  Full code  Continuous        01/11/20  0954        Code Status History    This patient has a current code status but no historical code status.   Advance Care Planning Activity       TOTAL TIME TAKING CARE OF THIS PATIENT: *30 minutes.    Fritzi Mandes M.D  [facility]    CC: Primary care physician; Jinny Sanders, MD

## 2020-01-12 NOTE — Progress Notes (Addendum)
RN ambulated the pt around the hall, Heart Rate was 103 before ambulation, pt just went to bathroom before walking the pt. Heart Rate was between 105-116 while ambulation, asymptomatic. MD notified.

## 2020-01-12 NOTE — Progress Notes (Signed)
Progress Note  Patient Name: Joyce Brock Date of Encounter: 01/12/2020  Primary Cardiologist: [facility], Dr. Stanford Breed  Subjective   No further episodes of syncope since admission.  She has been monitored on telemetry with repeat EKG and echocardiogram performed as below.  EKG pending and echo with nl EF, G1DD, and no acute structural / valvular abnormality. She denies chest pain, palpitations, presyncope today. We will mail her a monitor and have her follow-up in the office.   Inpatient Medications    Scheduled Meds: . atorvastatin  10 mg Oral QODAY  . enoxaparin (LOVENOX) injection  40 mg Subcutaneous Q24H  . pantoprazole  40 mg Oral Daily  . sodium chloride flush  3 mL Intravenous Q12H   Continuous Infusions:  PRN Meds: acetaminophen **OR** acetaminophen, butalbital-acetaminophen-caffeine, hydrOXYzine, ondansetron **OR** ondansetron (ZOFRAN) IV, polyethylene glycol   Vital Signs    Vitals:   01/11/20 1912 01/11/20 2328 01/12/20 0500 01/12/20 0721  BP: (!) 127/88 110/80  120/82  Pulse: 88 79  88  Resp: 17 15  16   Temp: 98.1 F (36.7 C) 98.7 F (37.1 C)  98.9 F (37.2 C)  TempSrc: Axillary Oral  Oral  SpO2: 99% 97%  96%  Weight:   75.1 kg   Height:        Intake/Output Summary (Last 24 hours) at 01/12/2020 0947 Last data filed at 01/11/2020 1841 Gross per 24 hour  Intake 827.69 ml  Output --  Net 827.69 ml   Last 3 Weights 01/12/2020 01/10/2020 04/29/2019  Weight (lbs) 165 lb 9.1 oz 164 lb 153 lb 4 oz  Weight (kg) 75.1 kg 74.39 kg 69.514 kg      Telemetry    NSR-ST - Personally Reviewed  ECG    Repeat EKG ordered today and pending.  Initial EKG showed sinus tachycardia with nonspecific T wave changes and prolonged QT interval- Personally Reviewed  Physical Exam   GEN: No acute distress.   Neck: No JVD Cardiac: RRR, no murmurs, rubs, or gallops.  Respiratory: Clear to auscultation bilaterally. GI: Soft, nontender, non-distended  MS: No  edema; No deformity. Neuro:  Nonfocal  Psych: Normal affect   Labs    High Sensitivity Troponin:   Recent Labs  Lab 01/10/20 2225 01/11/20 0038  TROPONINIHS <2 <2      Chemistry Recent Labs  Lab 01/10/20 2225  NA 140  K 3.8  CL 107  CO2 27  GLUCOSE 131*  BUN 12  CREATININE 0.86  CALCIUM 8.2*  GFRNONAA >60  GFRAA >60  ANIONGAP 6     Hematology Recent Labs  Lab 01/10/20 2225  WBC 8.4  RBC 4.70  HGB 13.9  HCT 41.3  MCV 87.9  MCH 29.6  MCHC 33.7  RDW 12.7  PLT 156    BNPNo results for input(s): BNP, PROBNP in the last 168 hours.   DDimer No results for input(s): DDIMER in the last 168 hours.   Radiology    CT Head Wo Contrast  Result Date: 01/10/2020 CLINICAL DATA:  Syncopal episodes EXAM: CT HEAD WITHOUT CONTRAST TECHNIQUE: Contiguous axial images were obtained from the base of the skull through the vertex without intravenous contrast. COMPARISON:  None. FINDINGS: Brain: No evidence of acute infarction, hemorrhage, hydrocephalus, extra-axial collection or mass lesion/mass effect. Vascular: No hyperdense vessel or unexpected calcification. Skull: Normal. Negative for fracture or focal lesion. Sinuses/Orbits: No acute finding. Other: None. IMPRESSION: No acute intracranial abnormality noted. Electronically Signed   By: Linus Mako.D.  On: 01/10/2020 23:33   CT Angio Chest PE W and/or Wo Contrast  Result Date: 01/11/2020 CLINICAL DATA:  Syncope EXAM: CT ANGIOGRAPHY CHEST WITH CONTRAST TECHNIQUE: Multidetector CT imaging of the chest was performed using the standard protocol during bolus administration of intravenous contrast. Multiplanar CT image reconstructions and MIPs were obtained to evaluate the vascular anatomy. CONTRAST:  50mL OMNIPAQUE IOHEXOL 350 MG/ML SOLN COMPARISON:  None. FINDINGS: Cardiovascular: There is a optimal opacification of the pulmonary arteries. There is no central,segmental, or subsegmental filling defects within the pulmonary arteries.  The heart is normal in size. No pericardial effusion or thickening. No evidence right heart strain. There is normal three-vessel brachiocephalic anatomy without proximal stenosis. The thoracic aorta is normal in appearance. Mediastinum/Nodes: No hilar, mediastinal, or axillary adenopathy. Thyroid gland, trachea, and esophagus demonstrate no significant findings. Lungs/Pleura: There is a small 4 mm pulmonary nodule seen in the posterior right lower lung. There is a minimal amount of bibasilar dependent atelectasis. No pleural effusion or pneumothorax. No airspace consolidation. Upper Abdomen: Partially visualized low-density lesions are seen within both lobes of the liver. Musculoskeletal: No chest wall abnormality. No acute or significant osseous findings. Review of the MIP images confirms the above findings. IMPRESSION: 1. No central, segmental, or subsegmental pulmonary embolism. 2. No acute intrathoracic pathology to explain the patient's symptoms. 3. 4 mm pulmonary nodule in the posterior right lower lobe. No follow-up needed if patient is low-risk. Non-contrast chest CT can be considered in 12 months if patient is high-risk. This recommendation follows the consensus statement: Guidelines for Management of Incidental Pulmonary Nodules Detected on CT Images: From the Fleischner Society 2017; Radiology 2017; 284:228-243. Electronically Signed   By: Prudencio Pair M.D.   On: 01/11/2020 00:28   DG Chest Port 1 View  Result Date: 01/11/2020 CLINICAL DATA:  Syncope EXAM: PORTABLE CHEST 1 VIEW COMPARISON:  None. FINDINGS: The heart size and mediastinal contours are within normal limits. Both lungs are clear. The visualized skeletal structures are unremarkable. IMPRESSION: No active disease. Electronically Signed   By: Prudencio Pair M.D.   On: 01/11/2020 00:03   ECHOCARDIOGRAM COMPLETE  Result Date: 01/11/2020    ECHOCARDIOGRAM REPORT   Patient Name:   Joyce Brock Date of Exam: 01/11/2020 Medical Rec #:   [id number]          Height:       66.0 in Accession #:    [id number]         Weight:       164.0 lb Date of Birth:  [birth year]          BSA:          1.838 m Patient Age:    54 years           BP:           129/80 mmHg Patient Gender: F                  HR:           90 bpm. Exam Location:  [facility] Procedure: 2D Echo Indications:     Syncope 780.2/R55  History:         Patient has prior history of Echocardiogram examinations, most                  recent 01/20/2016. Signs/Symptoms:Syncope; Risk                  Factors:Dyslipidemia. Palpitations.  Sonographer:  Francisco Capuchin Mucker Referring Phys:  [id number] Ilene Qua OPYD Diagnosing Phys: Kirk Ruths MD IMPRESSIONS  1. Left ventricular ejection fraction, by estimation, is 60 to 65%. The left ventricle has normal function. The left ventricle has no regional wall motion abnormalities. Left ventricular diastolic parameters are consistent with Grade I diastolic dysfunction (impaired relaxation).  2. Right ventricular systolic function is normal. The right ventricular size is normal. Tricuspid regurgitation signal is inadequate for assessing PA pressure.  3. The mitral valve is normal in structure. No evidence of mitral valve regurgitation. No evidence of mitral stenosis.  4. The aortic valve has an indeterminant number of cusps. Aortic valve regurgitation is not visualized. No aortic stenosis is present.  5. The inferior vena cava is normal in size with greater than 50% respiratory variability, suggesting right atrial pressure of 3 mmHg. FINDINGS  Left Ventricle: Left ventricular ejection fraction, by estimation, is 60 to 65%. The left ventricle has normal function. The left ventricle has no regional wall motion abnormalities. The left ventricular internal cavity size was normal in size. There is  no left ventricular hypertrophy. Left ventricular diastolic parameters are consistent with Grade I diastolic dysfunction (impaired relaxation). Right Ventricle: The right  ventricular size is normal.Right ventricular systolic function is normal. Tricuspid regurgitation signal is inadequate for assessing PA pressure. The tricuspid regurgitant velocity is 2.21 m/s, and with an assumed right atrial pressure of 10 mmHg, the estimated right ventricular systolic pressure is 74.2 mmHg. Left Atrium: Left atrial size was normal in size. Right Atrium: Right atrial size was normal in size. Pericardium: There is no evidence of pericardial effusion. Mitral Valve: The mitral valve is normal in structure. Normal mobility of the mitral valve leaflets. No evidence of mitral valve regurgitation. No evidence of mitral valve stenosis. Tricuspid Valve: The tricuspid valve is normal in structure. Tricuspid valve regurgitation is trivial. No evidence of tricuspid stenosis. Aortic Valve: The aortic valve has an indeterminant number of cusps. Aortic valve regurgitation is not visualized. No aortic stenosis is present. Pulmonic Valve: The pulmonic valve was normal in structure. Pulmonic valve regurgitation is trivial. No evidence of pulmonic stenosis. Aorta: The aortic root is normal in size and structure. Venous: The inferior vena cava is normal in size with greater than 50% respiratory variability, suggesting right atrial pressure of 3 mmHg. IAS/Shunts: No atrial level shunt detected by color flow Doppler.  LEFT VENTRICLE PLAX 2D LVIDd:         3.74 cm  Diastology LVIDs:         2.53 cm  LV e' lateral:   11.10 cm/s LV PW:         0.87 cm  LV E/e' lateral: 7.8 LV IVS:        1.06 cm  LV e' medial:    6.74 cm/s LVOT diam:     1.70 cm  LV E/e' medial:  12.9 LVOT Area:     2.27 cm  RIGHT VENTRICLE         IVC TAPSE (M-mode): 2.0 cm  IVC diam: 1.63 cm LEFT ATRIUM             Index       RIGHT ATRIUM           Index LA diam:        3.40 cm 1.85 cm/m  RA Area:     12.30 cm LA Vol (A2C):   31.7 ml 17.25 ml/m RA Volume:   27.30 ml  14.85 ml/m LA Vol (  A4C):   35.5 ml 19.31 ml/m LA Biplane Vol: 35.7 ml 19.42  ml/m   AORTA Ao Root diam: 2.70 cm MITRAL VALVE                TRICUSPID VALVE MV Area (PHT): 3.81 cm     TR Peak grad:   19.5 mmHg MV Decel Time: 199 msec     TR Vmax:        221.00 cm/s MV E velocity: 87.00 cm/s MV A velocity: 101.00 cm/s  SHUNTS MV E/A ratio:  0.86         Systemic Diam: 1.70 cm Kirk Ruths MD Electronically signed by Kirk Ruths MD Signature Date/Time: 01/11/2020/2:55:34 PM    Final     Cardiac Studies   Echo 01/11/2020 1. Left ventricular ejection fraction, by estimation, is 60 to 65%. The  left ventricle has normal function. The left ventricle has no regional  wall motion abnormalities. Left ventricular diastolic parameters are  consistent with Grade I diastolic  dysfunction (impaired relaxation).  2. Right ventricular systolic function is normal. The right ventricular  size is normal. Tricuspid regurgitation signal is inadequate for assessing  PA pressure.  3. The mitral valve is normal in structure. No evidence of mitral valve  regurgitation. No evidence of mitral stenosis.  4. The aortic valve has an indeterminant number of cusps. Aortic valve  regurgitation is not visualized. No aortic stenosis is present.  5. The inferior vena cava is normal in size with greater than 50%  respiratory variability, suggesting right atrial pressure of 3 mmHg.   Patient Profile     54 y.o. female with history of GERD, anxiety, hyperlipidemia, and syncope, and seen today for syncope work-up.  Assessment & Plan    Syncopal episode --Thought most consistent with vasovagal etiology with possible exacerbation due to dehydration. CXR without a involved swelling echo with normal LVSF, G1 DD, no significant valvular abnormality or acute structural change.  Telemetry without significant arrhythmia. We will mail her a monitor and can have her follow-up in the office at [facility] or with Dr. Stanford Breed in [place]. Message sent to triage.  Prolonged QT --Suspected as vasovagal  in etiology. Repeat EKG ordered and pending. Also recommend outpatient monitoring. Avoid anti-emetics and QT prolonging medications.  HLD --Continue statin.  Lung nodule --3.69mm pulmonary nodule. Follow-up CT per PCP.   Nausea / emesis --Reportedly long-standing and attributed to anxiety. Follow-up with PCP.  For questions or updates, please contact [place] Please consult [email or web address] for contact info under        Signed, Arvil Chaco, PA-C  01/12/2020, 9:47 AM

## 2020-01-12 NOTE — Telephone Encounter (Signed)
-----   Message from Arvil Chaco, PA-C sent at 01/12/2020  1:03 PM EDT ----- Regarding: Mail Zio Hi there,  This patient was seen during [facility] admission and needs a 2 week Live Zio - AT mailed to her for syncope, as well as follow-up in the office. Dr. Stanford Breed saw her during her admission; however, she can establish in our office if closer.   JV

## 2020-01-14 ENCOUNTER — Other Ambulatory Visit: Payer: Self-pay | Admitting: *Deleted

## 2020-01-14 ENCOUNTER — Encounter: Payer: Self-pay | Admitting: Neurology

## 2020-01-14 NOTE — Patient Outreach (Signed)
[facility]) Care Management  01/14/2020  Joyce Brock 1966/03/10 [id number]   Transition of care telephone call  Referral received:01/12/20 Initial outreach: 01/14/20 Insurance: [employer]   Initial unsuccessful telephone call to patient's preferred number in order to complete transition of care assessment; no answer, left HIPAA compliant voicemail message requesting return call.   Objective: Per the electronic medical record, JoyceBrock  was hospitalized at [facility] from 7/31-01/12/20  With/for Syncope   . Comorbidities include: Migraine headache, GERD, Anxiety, hyperlipidemia . She was discharged to home on 01/12/20  without the need for home health services or durable medical equipment per the discharge summary.   Plan: This RNCM will route unsuccessful outreach letter with [place] Management pamphlet and 24 hour Nurse Advice Line Magnet to [place] clinical pool to be mailed to patient's home address. This RNCM will attempt another outreach within 4 business days.   Joylene Draft, RN, BSN  [place] Management Coordinator  [phone]- Mobile [phone]- Toll Free Main Office

## 2020-01-15 DIAGNOSIS — J301 Allergic rhinitis due to pollen: Secondary | ICD-10-CM | POA: Diagnosis not present

## 2020-01-16 ENCOUNTER — Encounter: Payer: Self-pay | Admitting: Family Medicine

## 2020-01-16 ENCOUNTER — Other Ambulatory Visit: Payer: Self-pay

## 2020-01-16 ENCOUNTER — Telehealth: Payer: Self-pay

## 2020-01-16 ENCOUNTER — Ambulatory Visit
Admission: EM | Admit: 2020-01-16 | Discharge: 2020-01-16 | Disposition: A | Discharge status: Home / Self Care | Payer: 59 | Attending: Emergency Medicine | Admitting: Emergency Medicine

## 2020-01-16 ENCOUNTER — Ambulatory Visit (INDEPENDENT_AMBULATORY_CARE_PROVIDER_SITE_OTHER): Payer: 59 | Admitting: Family Medicine

## 2020-01-16 VITALS — BP 110/70 | HR 90 | Temp 98.3°F | Ht 66.5 in | Wt 158.5 lb

## 2020-01-16 DIAGNOSIS — R000 Tachycardia, unspecified: Secondary | ICD-10-CM | POA: Diagnosis not present

## 2020-01-16 DIAGNOSIS — K219 Gastro-esophageal reflux disease without esophagitis: Secondary | ICD-10-CM

## 2020-01-16 DIAGNOSIS — R509 Fever, unspecified: Secondary | ICD-10-CM | POA: Diagnosis not present

## 2020-01-16 DIAGNOSIS — R911 Solitary pulmonary nodule: Secondary | ICD-10-CM | POA: Diagnosis not present

## 2020-01-16 DIAGNOSIS — Z7689 Persons encountering health services in other specified circumstances: Secondary | ICD-10-CM | POA: Diagnosis not present

## 2020-01-16 DIAGNOSIS — R55 Syncope and collapse: Secondary | ICD-10-CM

## 2020-01-16 LAB — POC SARS CORONAVIRUS 2 AG -  ED: SARS Coronavirus 2 Ag: NEGATIVE

## 2020-01-16 MED ORDER — ACETAMINOPHEN 325 MG PO TABS
650.0000 mg | ORAL_TABLET | Freq: Once | ORAL | Status: AC
  Administered 2020-01-16: 650 mg via ORAL

## 2020-01-16 MED ORDER — PANTOPRAZOLE SODIUM 40 MG PO TBEC: 40.0000 mg | DELAYED_RELEASE_TABLET | Freq: Every day | ORAL | 3 refills | Status: DC

## 2020-01-16 MED FILL — PANTOPRAZOLE SOD DR 40 MG T: 40 | 30 days supply | Qty: 30 | Fill #0

## 2020-01-16 NOTE — Telephone Encounter (Addendum)
Pt left v/m at 4:15 that she had HFU visit earlier today and now pt has chills and fever of 101.4. pt thought she had UTI while in hospital and pt wants to know if needs to bring urine specimen to [facility]. Pt took Excedrin migraine this morning and wants to know if she can take Tylenol for fever. Pt request cb ASAP. I tried to call pt and no answer but left /vm requesting cb. Pt called back; pt said pt started around 2 pm this afternoon with chills and fever 99 now temp is 101.4. pt is not having burning or pain upon urination, no abd or back pain, no frequency of urine. Diarrhea x 1 this afternoon,pt feels slightly fatigued. Pt traveled to [facility] 12/14/19 - 12/21/19. Pt had neg covid test while in hospital; pt had pfizer covid vaccines on 06/26/19 and 07/17/19. I spoke with Dr Diona Browner and she said the urine culture in hospital was neg. And with no UTI symptoms but sudden fever for no reason pt needs to go to UC for eval and possible covid testing and pt voiced understanding and will go to [facility] in [place] now. Pt aware closes at 6 PM. FYI to Dr Diona Browner.

## 2020-01-16 NOTE — Progress Notes (Signed)
Chief Complaint  Patient presents with  . Hospitalization Follow-up    Syncope    History of Present Illness: HPI   54 year old female presents for hospital follow up.  Admitted 01/10/2020  Discharged 01/12/2020 Hospital summary reviewed and copied for information purposes. The patient reports that she had been in her usual state of health until about a week ago when she developed chest discomfort that she was attributing to indigestion. She also began to experience lightheadedness 1.Syncope -Presents with syncope while seated associated with nausea, recent presyncopal episodes, and chest discomfort--suspected vasovagal -She was persistently tachycardic in ED with normal thyroid studies and negative CTA chest, now improved -EKG with sinus tachycardia and borderline repol abn and QT interval - received IV fluid  -patient seen by cardiology [facility]-- suspected vasovagal episode in the setting of dehydration, migraine headache and few stressors last week.  -Echo wnl -Patient feels a whole lot better today. Headache resolved. -HR 100's--asymptomatic  2.GERD -cont PPI  3.Anxiety -Continue as-needed Atarax  4.Hyperlipidemia -Continue Lipitor  5. Lung nodule  - Pulmonary nodule noted incidentally on CT in ED  - Patient has quit smoking  - Outpatient follow-up recommended--defer to PCP   01/16/20   She reports syncope happened in setting of worse reflux, mild dehydration, decrease po intake and several nights of poor sleep. Also took atarax.. 1 hour later passed out. Was out for 15 secs. Since discharge she reports she has no further syncope events. GERD/indigestion had been worse after triggers.. now better with omeprazole.  No further chest pain. Heart rate has improved. Mild dizziness.  She has ben trying to keep  with fluids.  Urine culture returned negative.   Has appt with Neuro for chronic headaches   Pulmonary nodule seen incidentally on  CT. IMPRESSION: 1. No central, segmental, or subsegmental pulmonary embolism. 2. No acute intrathoracic pathology to explain the patient's symptoms. 3. 4 mm pulmonary nodule in the posterior right lower lobe. No follow-up needed if patient is low-risk. Non-contrast chest CT can be considered in 12 months if patient is high-risk. Smoker 10 years, quit in 1994.  This visit occurred during the SARS-CoV-2 public health emergency.  Safety protocols were in place, including screening questions prior to the visit, additional usage of staff PPE, and extensive cleaning of exam room while observing appropriate contact time as indicated for disinfecting solutions.   COVID 19 screen:  No recent travel or known exposure to COVID19 The patient denies respiratory symptoms of COVID 19 at this time. The importance of social distancing was discussed today.     Review of Systems  Constitutional: Negative for chills and fever.  HENT: Negative for congestion and ear pain.   Eyes: Negative for pain and redness.  Respiratory: Negative for cough and shortness of breath.   Cardiovascular: Negative for chest pain, palpitations and leg swelling.  Gastrointestinal: Negative for abdominal pain, blood in stool, constipation, diarrhea, nausea and vomiting.  Genitourinary: Negative for dysuria.  Musculoskeletal: Negative for falls and myalgias.  Skin: Negative for rash.  Neurological: Negative for dizziness.  Psychiatric/Behavioral: Negative for depression. The patient is not nervous/anxious.       Past Medical History:  Diagnosis Date  . Allergy   . Anxiety   . GERD (gastroesophageal reflux disease)   . Hyperlipidemia   . Migraines     reports that she has quit smoking. She has never used smokeless tobacco. She reports current alcohol use. She reports that she does not use drugs.  Current Outpatient Medications:  .  acetaminophen (TYLENOL) 500 MG tablet, Take 1,000 mg by mouth every 6 (six) hours as  needed., Disp: , Rfl:  .  atorvastatin (LIPITOR) 10 MG tablet, Take 10 mg by mouth every other day., Disp: , Rfl:  .  Calcium Carbonate-Vitamin D (CALCIUM 600+D) 600-400 MG-UNIT tablet, Take 2 tablets by mouth daily., Disp: , Rfl:  .  cetirizine (ALLERGY 24HOUR INDOOR/OUTDOOR) 10 MG tablet, Take 10 mg by mouth daily. , Disp: , Rfl:  .  EPINEPHrine 0.3 mg/0.3 mL IJ SOAJ injection, Inject into the muscle., Disp: , Rfl:  .  fluticasone (FLONASE) 50 MCG/ACT nasal spray, Place 1 spray into both nostrils daily. , Disp: , Rfl:  .  hydrOXYzine (ATARAX/VISTARIL) 50 MG tablet, Take 1 tablet (50 mg total) by mouth 3 (three) times daily as needed for anxiety or nausea., Disp: 30 tablet, Rfl: 0 .  omeprazole (PRILOSEC OTC) 20 MG tablet, Take 40 mg by mouth daily., Disp: , Rfl:  .  vitamin B-12 (CYANOCOBALAMIN) 1000 MCG tablet, Take 1,000 mcg by mouth daily., Disp: , Rfl:    Observations/Objective: Blood pressure 110/70, pulse 90, temperature 98.3 F (36.8 C), temperature source Temporal, height 5' 6.5" (1.689 m), weight 158 lb 8 oz (71.9 kg), last menstrual period 03/23/2013, SpO2 99 %.  Physical Exam Constitutional:      General: She is not in acute distress.    Appearance: Normal appearance. She is well-developed. She is not ill-appearing or toxic-appearing.  HENT:     Head: Normocephalic.     Right Ear: Hearing, tympanic membrane, ear canal and external ear normal. Tympanic membrane is not erythematous, retracted or bulging.     Left Ear: Hearing, tympanic membrane, ear canal and external ear normal. Tympanic membrane is not erythematous, retracted or bulging.     Nose: No mucosal edema or rhinorrhea.     Right Sinus: No maxillary sinus tenderness or frontal sinus tenderness.     Left Sinus: No maxillary sinus tenderness or frontal sinus tenderness.     Mouth/Throat:     Pharynx: Uvula midline.  Eyes:     General: Lids are normal. Lids are everted, no foreign bodies appreciated.      Conjunctiva/sclera: Conjunctivae normal.     Pupils: Pupils are equal, round, and reactive to light.  Neck:     Thyroid: No thyroid mass or thyromegaly.     Vascular: No carotid bruit.     Trachea: Trachea normal.  Cardiovascular:     Rate and Rhythm: Normal rate and regular rhythm.     Pulses: Normal pulses.     Heart sounds: Normal heart sounds, S1 normal and S2 normal. No murmur heard.  No friction rub. No gallop.   Pulmonary:     Effort: Pulmonary effort is normal. No tachypnea or respiratory distress.     Breath sounds: Normal breath sounds. No decreased breath sounds, wheezing, rhonchi or rales.  Abdominal:     General: Bowel sounds are normal.     Palpations: Abdomen is soft.     Tenderness: There is no abdominal tenderness.  Musculoskeletal:     Cervical back: Normal range of motion and neck supple.  Skin:    General: Skin is warm and dry.     Findings: No rash.  Neurological:     Mental Status: She is alert and oriented to person, place, and time.     GCS: GCS eye subscore is 4. GCS verbal subscore is 5. GCS motor subscore  is 6.     Cranial Nerves: No cranial nerve deficit.     Sensory: No sensory deficit.     Motor: No abnormal muscle tone.     Coordination: Coordination normal.     Gait: Gait normal.     Deep Tendon Reflexes: Reflexes are normal and symmetric.     Comments: Nml cerebellar exam   No papilledema  Psychiatric:        Mood and Affect: Mood is not anxious or depressed.        Speech: Speech normal.        Behavior: Behavior normal. Behavior is cooperative.        Thought Content: Thought content normal.        Cognition and Memory: Memory is not impaired. She does not exhibit impaired recent memory or impaired remote memory.        Judgment: Judgment normal.      Assessment and Plan   Pulmonary nodule Plan repeat in 1 year for stability.  Tachycardia Improved with hydration. Avoid caffeine, decongestants.  Vasovagal syncope No further  issues.  GERD (gastroesophageal reflux disease) Stop prilosec and change to pantoprazole. Try weaning off after 3 weeks back to 20 mg prilosec. At next OV in 1 month if recurrent GERD.Marland Kitchen will consider referral to GI for possible endoscopy.     Eliezer Lofts, MD

## 2020-01-16 NOTE — ED Triage Notes (Signed)
Patient sent here by PCP after being seen in their office today. Reports after leaving, she developed fever and chills. Reports her temperature at home got up to 101.4. also reports 1 episode of diarrhea. Denies cough, chest pain, ShOB, n/v, sore throat, nasal congestion/drainage, and ear pain. Reports she took some excedrin this morning.

## 2020-01-16 NOTE — Discharge Instructions (Signed)
Take Tylenol as needed for fever.    Your rapid COVID test is negative; the send-out test is pending.  You should self quarantine until your test result is back.    Go to the emergency department if you have acute worsening symptoms.  Follow up with your primary care provider on Monday.

## 2020-01-16 NOTE — Patient Instructions (Addendum)
Keep up with hydration. Avoid caffeine, decongestants.  Stop prilosec and change to pantoprazole x 3 weeks, then decrease to omeprazole 20 mg daily.Marland Kitchen we will re-evaluate it at next OV in September.

## 2020-01-16 NOTE — Assessment & Plan Note (Signed)
Stop prilosec and change to pantoprazole. Try weaning off after 3 weeks back to 20 mg prilosec. At next OV in 1 month if recurrent GERD.Marland Kitchen will consider referral to GI for possible endoscopy.

## 2020-01-16 NOTE — Telephone Encounter (Signed)
Agree with dispo 

## 2020-01-16 NOTE — Assessment & Plan Note (Signed)
No further issues 

## 2020-01-16 NOTE — Assessment & Plan Note (Addendum)
Improved with hydration. Avoid caffeine, decongestants.

## 2020-01-16 NOTE — Assessment & Plan Note (Signed)
Plan repeat in 1 year for stability.

## 2020-01-16 NOTE — ED Provider Notes (Signed)
Roderic Palau    CSN: [id number] Arrival date & time: 01/16/20  1710      History   Chief Complaint Chief Complaint  Patient presents with  . Fever  . Chills    HPI Joyce Brock is a 54 y.o. female.  Patient was hospitalized on 01/10/2020 for syncope, pulmonary nodule, lower UTI; COVID negative; treated with one dose of IV Rocephin but urine culture then negative; She was seen by cardiology while hospitalized.  She had a follow-up visit with her PCP today. She then developed fever, chills, and diarrhea after going home. Tmax 101.4.  One episode of diarrhea.  Her PCP instructed her to follow-up here for COVID test. She denies congestion, cough, chest pain, shortness of breath, abdominal pain, dysuria, vomiting, diarrhea, or other symptoms. Patient took Excedrin this morning but none since.  The history is provided by the patient.    Past Medical History:  Diagnosis Date  . Allergy   . Anxiety   . GERD (gastroesophageal reflux disease)   . Hyperlipidemia   . Migraines     Patient Active Problem List   Diagnosis Date Noted  . GERD (gastroesophageal reflux disease) 01/16/2020  . Tachycardia   . Vasovagal syncope 01/11/2020  . Pulmonary nodule   . GAD (generalized anxiety disorder) 10/04/2018  . Panic attack 10/04/2018  . Nausea 10/04/2018  . Atopic dermatitis 01/18/2017  . Left upper arm pain 09/25/2014  . Verruca vulgaris 11/15/2012  . Rectal pain 03/29/2012  . Palpitations 12/26/2011  . HYPERCHOLESTEROLEMIA 02/05/2009  . MAMMOGRAM, ABNORMAL, LEFT 02/05/2009  . ALLERGIC RHINITIS 10/30/2008  . PSORIASIS 10/30/2008  . URINARY INCONTINENCE, MIXED 10/30/2008    Past Surgical History:  Procedure Laterality Date  . CHOLECYSTECTOMY      OB History   No obstetric history on file.      Home Medications    Prior to Admission medications   Medication Sig Start Date End Date Taking? Authorizing Provider  acetaminophen (TYLENOL) 500 MG tablet Take 1,000  mg by mouth every 6 (six) hours as needed.    [provider]  atorvastatin (LIPITOR) 10 MG tablet Take 10 mg by mouth every other day.    [provider]  Calcium Carbonate-Vitamin D (CALCIUM 600+D) 600-400 MG-UNIT tablet Take 2 tablets by mouth daily.    [provider]  cetirizine (ALLERGY 24HOUR INDOOR/OUTDOOR) 10 MG tablet Take 10 mg by mouth daily.     [provider]  EPINEPHrine 0.3 mg/0.3 mL IJ SOAJ injection Inject into the muscle. 01/02/20   [provider]  fluticasone (FLONASE) 50 MCG/ACT nasal spray Place 1 spray into both nostrils daily.  11/20/16   [provider]  hydrOXYzine (ATARAX/VISTARIL) 50 MG tablet Take 1 tablet (50 mg total) by mouth 3 (three) times daily as needed for anxiety or nausea. 10/04/18   Bedsole, Amy E, MD  pantoprazole (PROTONIX) 40 MG tablet Take 1 tablet (40 mg total) by mouth daily. 01/16/20   Bedsole, Amy E, MD  vitamin B-12 (CYANOCOBALAMIN) 1000 MCG tablet Take 1,000 mcg by mouth daily.    [provider]    Family History Family History  Problem Relation Age of Onset  . Osteoporosis Mother   . Irritable bowel syndrome Mother   . Cancer Sister 80       breast   . Stomach cancer Paternal Grandfather   . Colon cancer Neg Hx   . Esophageal cancer Neg Hx   . Rectal cancer Neg Hx  Social History Social History   Tobacco Use  . Smoking status: Former Research scientist (life sciences)  . Smokeless tobacco: Never Used  Vaping Use  . Vaping Use: Never used  Substance Use Topics  . Alcohol use: Yes    Comment: Rare  . Drug use: No     Allergies   Patient has no known allergies.   Review of Systems Review of Systems  Constitutional: Positive for chills and fever.  HENT: Negative for ear pain and sore throat.   Eyes: Negative for pain and visual disturbance.  Respiratory: Negative for cough and shortness of breath.   Cardiovascular: Negative for chest pain and palpitations.  Gastrointestinal: Positive  for diarrhea. Negative for abdominal pain and vomiting.  Genitourinary: Negative for dysuria and hematuria.  Musculoskeletal: Negative for arthralgias and back pain.  Skin: Negative for color change and rash.  Neurological: Negative for seizures and syncope.  All other systems reviewed and are negative.    Physical Exam Triage Vital Signs ED Triage Vitals  Enc Vitals Group     BP      Pulse      Resp      Temp      Temp src      SpO2      Weight      Height      Head Circumference      Peak Flow      Pain Score      Pain Loc      Pain Edu?      Excl. in [place]?    No data found.  Updated Vital Signs BP (!) 129/91   Pulse (!) 127   Temp (!) 100.9 F (38.3 C)   Resp 14   LMP 03/23/2013   SpO2 96%   Visual Acuity Right Eye Distance:   Left Eye Distance:   Bilateral Distance:    Right Eye Near:   Left Eye Near:    Bilateral Near:     Physical Exam Vitals and nursing note reviewed.  Constitutional:      General: She is not in acute distress.    Appearance: She is well-developed. She is not ill-appearing.  HENT:     Head: Normocephalic and atraumatic.     Right Ear: Tympanic membrane normal.     Left Ear: Tympanic membrane normal.     Nose: Nose normal.     Mouth/Throat:     Mouth: Mucous membranes are moist.     Pharynx: Oropharynx is clear.  Eyes:     Conjunctiva/sclera: Conjunctivae normal.  Cardiovascular:     Rate and Rhythm: Regular rhythm. Tachycardia present.     Heart sounds: No murmur heard.   Pulmonary:     Effort: Pulmonary effort is normal. No respiratory distress.     Breath sounds: Normal breath sounds. No wheezing or rhonchi.  Abdominal:     Palpations: Abdomen is soft.     Tenderness: There is no abdominal tenderness. There is no guarding or rebound.  Musculoskeletal:     Cervical back: Neck supple.     Right lower leg: No edema.     Left lower leg: No edema.  Skin:    General: Skin is warm and dry.     Findings: No rash.    Neurological:     General: No focal deficit present.     Mental Status: She is alert and oriented to person, place, and time.     Gait: Gait normal.  Psychiatric:  Mood and Affect: Mood normal.        Behavior: Behavior normal.      UC Treatments / Results  Labs (all labs ordered are listed, but only abnormal results are displayed) Labs Reviewed  POC SARS CORONAVIRUS 2 AG -  ED    EKG   Radiology No results found.  Procedures Procedures (including critical care time)  Medications Ordered in UC Medications  acetaminophen (TYLENOL) tablet 650 mg (650 mg Oral Given 01/16/20 1734)    Initial Impression / Assessment and Plan / UC Course  I have reviewed the triage vital signs and the nursing notes.  Pertinent labs & imaging results that were available during my care of the patient were reviewed by me and considered in my medical decision making (see chart for details).   Fever, Tachycardia.  Patient declines EKG, lab work, transfer to the ED.  Tylenol given here.  POC COVID negative; PCR pending.  Instructed patient to self quarantine until the test result is back.  Discussed with patient at length that she should go to the ED if she has acute worsening symptoms.  Also discussed that she should follow-up with her PCP on Monday.  Patient agrees to plan of care.      Final Clinical Impressions(s) / UC Diagnoses   Final diagnoses:  Fever, unspecified  Tachycardia     Discharge Instructions     Take Tylenol as needed for fever.    Your rapid COVID test is negative; the send-out test is pending.  You should self quarantine until your test result is back.    Go to the emergency department if you have acute worsening symptoms.  Follow up with your primary care provider on Monday.         ED Prescriptions    None     PDMP not reviewed this encounter.   Sharion Balloon, NP 01/16/20 1747

## 2020-01-18 LAB — NOVEL CORONAVIRUS, NAA: SARS-CoV-2, NAA: NOT DETECTED

## 2020-01-18 LAB — SARS-COV-2, NAA 2 DAY TAT

## 2020-01-19 DIAGNOSIS — J301 Allergic rhinitis due to pollen: Secondary | ICD-10-CM | POA: Diagnosis not present

## 2020-01-20 ENCOUNTER — Other Ambulatory Visit: Payer: Self-pay | Admitting: *Deleted

## 2020-01-20 NOTE — Patient Outreach (Signed)
[facility]) Care Management  01/20/2020  Joyce Brock 23-Feb-1966 [id number]   Transition of care call Referral received: 01/12/20 Initial outreach attempt: 01/14/20 Insurance: UMR    2nd unsuccessful telephone call to patient's preferred contact number in order to complete post hospital discharge transition of care assessment , no answer left HIPAA compliant message requesting return call.    Objective: Per the electronic medical record, Joyce Brock  was hospitalized at [facility] from 7/31-01/12/20  With/for Syncope   . Comorbidities include: Migraine headache, GERD, Anxiety, hyperlipidemia . She was discharged to home on 01/12/20  without the need for home health services or durable medical equipment per the discharge summary.    Plan If no return call from patient will attempt 3rd outreach in the next 4 business days.    Joylene Draft, RN, BSN  [place] Management Coordinator  [phone]- Mobile [phone]- Toll Free Main Office

## 2020-01-22 ENCOUNTER — Other Ambulatory Visit: Payer: Self-pay | Admitting: *Deleted

## 2020-01-22 DIAGNOSIS — J301 Allergic rhinitis due to pollen: Secondary | ICD-10-CM | POA: Diagnosis not present

## 2020-01-22 NOTE — Patient Outreach (Signed)
[facility]) Care Management  01/22/2020  Joyce Brock August 10, 1965 [id number]   Transition of care call Referral received: 01/12/20 Initial outreach attempt: 01/14/20 Insurance: [place] unsuccessful telephone call to patient's preferred contact number in order to complete post hospital discharge transition of care assessment; no answer, left HIPAA compliant message requesting return call.   Objective: Per the electronic medical record, Joyce Brock  was hospitalized at [facility] from 7/31-01/12/20  With/for Syncope   . Comorbidities include: Migraine headache, GERD, Anxiety, hyperlipidemia . She was discharged to home on 01/12/20  without the need for home health services or durable medical equipment per the discharge summary.   Plan: If no return call from patient, will close case to [place] Management services in 10 business days after initial post hospital discharge outreach, on 8/4 /21.   Joylene Draft, RN, BSN  [place] Management Coordinator  [phone]- Mobile [phone]- Toll Free Main Office

## 2020-01-23 ENCOUNTER — Telehealth: Payer: Self-pay

## 2020-01-23 NOTE — Telephone Encounter (Signed)
Pt left v/m that Dr Diona Browner gave pt pantoprazole that pt started on 01/20/20. On 01/22/20 pt started with lower back pain and back of legs are hurting; more of a dull ache and feels like legs are going to sleep when pt rubs her legs and not like a leg cramp. Pt had joint pain when on a statin before and pt is back on a statin again. Pt cannot think of anything different she has done except start Pantoprazole. Pt denies any injury. Pt has no UTI symptoms. No fever and no abd pain.Pt wants to know if this is side effect of pantoprazole. I am not aware of pantoprazole causing lower back or leg pain. Sending note to Dr Diona Browner. [facility]. Pt request cb.

## 2020-01-23 NOTE — Telephone Encounter (Signed)
Advised pt of changing to nexium. Pt reports she has not had any further fever and that her HR has leveled out over the last couple of days into the 80s. Pt verbalized understanding.

## 2020-01-23 NOTE — Telephone Encounter (Signed)
Yes these can be uncommon side effects of pantoprazole.  Okay to stop pantoprazole and change to  nexium 2 tabs of 20 mg daily over the counter. Also please verify no further fever or other infectious symptoms as she was seen for at ED on 01/16/2020

## 2020-01-26 DIAGNOSIS — J301 Allergic rhinitis due to pollen: Secondary | ICD-10-CM | POA: Diagnosis not present

## 2020-01-27 ENCOUNTER — Ambulatory Visit (INDEPENDENT_AMBULATORY_CARE_PROVIDER_SITE_OTHER): Payer: 59

## 2020-01-27 ENCOUNTER — Other Ambulatory Visit: Payer: Self-pay | Admitting: *Deleted

## 2020-01-27 ENCOUNTER — Telehealth: Payer: Self-pay | Admitting: Physician Assistant

## 2020-01-27 DIAGNOSIS — R55 Syncope and collapse: Secondary | ICD-10-CM

## 2020-01-27 NOTE — Telephone Encounter (Addendum)
Spoke with the patient. She has not had any reoccurrence of pre-syncope or syncope. She does have a pulse ox at home and has been monitoring her HR. Patient sts that her resting HR has been elevated at times.  She does now think it would be a good idea to go proceed with wearing the recommended 14 day  Zio AT.  Adv the patient iRhtym will attempt to contact her to verify her mailing address before mailing the monitor. Monitor is to be worn for 14 days. She should follow the application and return instructions included in the package.  Message fwd to scheduling to contact her to schedule a 5-6 wk f/u. Adv the patient that if her zio is normal she may be able to cancel that appt.  Patient agreeable with the plan and voiced appreciation for the call back.

## 2020-01-27 NOTE — Patient Outreach (Signed)
[facility]) Care Management  01/27/2020  Joyce GIBEAULT 01-Jul-1965 [id number]  Transition of care /Case Closure Unsuccessful outreach    Referral received:01/12/20 Initial outreach:01/14/20 Insurance: UMR    Unable to complete post hospital discharge transition of care assessment. No return call form patient after 3 call attempts and no response to request to contact RN Care Coordinator in unsuccessful outreach letter mailed to home on 01/14/20.  Objective: Per the electronic medical record,Mrs.Mitchellwas hospitalized at [facility] from 7/31-8/2/21With/for Syncope. Comorbidities include: Migraine headache, GERD, Anxiety, hyperlipidemia .She was discharged to home on 8/2/21without the need for home health services or durable medical equipment per the discharge summary.  Plan Case closed to [facility] [employer] as it has been 10 days since initial post discharge outreach attempt.   Joylene Draft, RN, BSN  [place] Management Coordinator  [phone]- Mobile [phone]- Toll Free Main Office

## 2020-01-27 NOTE — Telephone Encounter (Signed)
Called the patient. lmom that the zio AT has been ordered. i[employer] will attempt to contact her to verify her mailing address before mailing the monitor. Monitor is to be worn for 14 days. She should follow the application and return instruction included in the package.  She can contact our office if any questions. She will need a 5-6 week f/u appt with [facility]. msg fwd to scheduling to contact the patient to schedule.

## 2020-01-27 NOTE — Telephone Encounter (Signed)
Returned the call to [facility] @ [employer] at the phone number provided. Phone rings out with no answer and no voicemail option.  Contacted [employer] customer service and spoke with [place]. The first order has been cancelled and the second order for the Zio AT is being prepared to be shipped to the patient. Reggie has placed a note for [facility] to see if needed. p

## 2020-01-27 NOTE — Telephone Encounter (Signed)
Patient returning call .  Attempted to confirm mailing address and schedule fu but patient states this conflicts with dc instructions and visit with pcp that she didn't need a cardio fu   Please call

## 2020-01-27 NOTE — Telephone Encounter (Signed)
Please call to verify the order for monitor. States they received 2 orders.

## 2020-01-29 DIAGNOSIS — J301 Allergic rhinitis due to pollen: Secondary | ICD-10-CM | POA: Diagnosis not present

## 2020-01-30 DIAGNOSIS — J301 Allergic rhinitis due to pollen: Secondary | ICD-10-CM | POA: Diagnosis not present

## 2020-02-02 DIAGNOSIS — R55 Syncope and collapse: Secondary | ICD-10-CM | POA: Diagnosis not present

## 2020-02-02 DIAGNOSIS — J301 Allergic rhinitis due to pollen: Secondary | ICD-10-CM | POA: Diagnosis not present

## 2020-02-05 DIAGNOSIS — J301 Allergic rhinitis due to pollen: Secondary | ICD-10-CM | POA: Diagnosis not present

## 2020-02-09 DIAGNOSIS — J301 Allergic rhinitis due to pollen: Secondary | ICD-10-CM | POA: Diagnosis not present

## 2020-02-11 ENCOUNTER — Telehealth: Payer: Self-pay | Admitting: Family Medicine

## 2020-02-11 DIAGNOSIS — E7800 Pure hypercholesterolemia, unspecified: Secondary | ICD-10-CM

## 2020-02-11 NOTE — Telephone Encounter (Signed)
-----   Message from Cloyd Stagers, RT sent at 01/26/2020 11:17 AM EDT ----- Regarding: Lab Orders for Thursday 9.2.2021 Please place lab orders for Thursday 9.2.2021, office visit for physical on Friday 9.3.2021 Thank you, Dyke Maes RT(R)

## 2020-02-12 ENCOUNTER — Other Ambulatory Visit: Payer: Self-pay

## 2020-02-12 ENCOUNTER — Other Ambulatory Visit (INDEPENDENT_AMBULATORY_CARE_PROVIDER_SITE_OTHER): Payer: 59

## 2020-02-12 DIAGNOSIS — J301 Allergic rhinitis due to pollen: Secondary | ICD-10-CM | POA: Diagnosis not present

## 2020-02-12 DIAGNOSIS — E7800 Pure hypercholesterolemia, unspecified: Secondary | ICD-10-CM

## 2020-02-12 LAB — LIPID PANEL
Cholesterol: 183 mg/dL (ref 0–200)
HDL: 53 mg/dL (ref >39.00)
LDL Cholesterol: 108 mg/dL — ABNORMAL HIGH (ref 0–99)
NonHDL: 130.24
Total CHOL/HDL Ratio: 3
Triglycerides: 113 mg/dL (ref 0.0–149.0)
VLDL: 22.6 mg/dL (ref 0.0–40.0)

## 2020-02-12 LAB — COMPREHENSIVE METABOLIC PANEL
ALT: 28 U/L (ref 0–35)
AST: 22 U/L (ref 0–37)
Albumin: 4.3 g/dL (ref 3.5–5.2)
Alkaline Phosphatase: 74 U/L (ref 39–117)
BUN: 11 mg/dL (ref 6–23)
CO2: 30 mEq/L (ref 19–32)
Calcium: 9.5 mg/dL (ref 8.4–10.5)
Chloride: 102 mEq/L (ref 96–112)
Creatinine, Ser: 0.83 mg/dL (ref 0.40–1.20)
GFR: 71.36 mL/min (ref >60.00)
Glucose, Bld: 92 mg/dL (ref 70–99)
Potassium: 3.7 mEq/L (ref 3.5–5.1)
Sodium: 139 mEq/L (ref 135–145)
Total Bilirubin: 0.6 mg/dL (ref 0.2–1.2)
Total Protein: 6.9 g/dL (ref 6.0–8.3)

## 2020-02-12 NOTE — Progress Notes (Signed)
No critical labs need to be addressed urgently. We will discuss labs in detail at upcoming office visit.   

## 2020-02-13 ENCOUNTER — Other Ambulatory Visit (HOSPITAL_COMMUNITY)
Admission: RE | Admit: 2020-02-13 | Discharge: 2020-02-13 | Disposition: A | Discharge status: Home / Self Care | Payer: 59 | Source: Ambulatory Visit | Attending: Family Medicine | Admitting: Family Medicine

## 2020-02-13 ENCOUNTER — Encounter: Payer: Self-pay | Admitting: Family Medicine

## 2020-02-13 ENCOUNTER — Ambulatory Visit (INDEPENDENT_AMBULATORY_CARE_PROVIDER_SITE_OTHER): Payer: 59 | Admitting: Family Medicine

## 2020-02-13 VITALS — BP 122/84 | HR 78 | Temp 98.5°F | Ht 66.54 in | Wt 158.0 lb

## 2020-02-13 DIAGNOSIS — Z0000 Encounter for general adult medical examination without abnormal findings: Secondary | ICD-10-CM | POA: Diagnosis not present

## 2020-02-13 DIAGNOSIS — R55 Syncope and collapse: Secondary | ICD-10-CM | POA: Diagnosis not present

## 2020-02-13 DIAGNOSIS — Z124 Encounter for screening for malignant neoplasm of cervix: Secondary | ICD-10-CM | POA: Diagnosis not present

## 2020-02-13 DIAGNOSIS — R000 Tachycardia, unspecified: Secondary | ICD-10-CM

## 2020-02-13 DIAGNOSIS — K219 Gastro-esophageal reflux disease without esophagitis: Secondary | ICD-10-CM

## 2020-02-13 DIAGNOSIS — Z23 Encounter for immunization: Secondary | ICD-10-CM | POA: Diagnosis not present

## 2020-02-13 DIAGNOSIS — E7800 Pure hypercholesterolemia, unspecified: Secondary | ICD-10-CM | POA: Diagnosis not present

## 2020-02-13 DIAGNOSIS — J301 Allergic rhinitis due to pollen: Secondary | ICD-10-CM | POA: Diagnosis not present

## 2020-02-13 NOTE — Assessment & Plan Note (Signed)
Well controlled. Continue current medication.  

## 2020-02-13 NOTE — Patient Instructions (Signed)

## 2020-02-13 NOTE — Assessment & Plan Note (Signed)
Stable control on no meds, can use H2blocker prn

## 2020-02-13 NOTE — Assessment & Plan Note (Signed)
On ZIO monitor.

## 2020-02-13 NOTE — Assessment & Plan Note (Signed)
No further episodes

## 2020-02-13 NOTE — Progress Notes (Signed)
Chief Complaint  Patient presents with  . Annual Exam    Pt would like to discuss recent hospitalization and recent neruology appointment about headaches    History of Present Illness: HPI  The patient is here for annual wellness exam and preventative care.    Chronic headaches: Has upcoming appt with neurology. She has only had 2 headaches since starting allergy shots. She has also stopped caffeine and processed meat. No further vasovagal syncope.  Fever on 8/6... neg COVID tests.. fever resolved on is own.  No other symptoms.   She is wearing ZIO  or 14 days.. has follow up with cardiology for this. No CP, no palpitations, no SOB.   GERD:  Good control, stopped pantoprazole given leg cramps... went to nexium and now weaned off. Minimal symptoms.  Elevated Cholesterol:  Good control on atorvastatin 5 mg every other day. Lab Results  Component Value Date   CHOL 183 02/12/2020   HDL 53.00 02/12/2020   LDLCALC 108 (H) 02/12/2020   LDLDIRECT 168.7 03/22/2012   TRIG 113.0 02/12/2020   CHOLHDL 3 02/12/2020  The 10-year ASCVD risk score Mikey Bussing DC Jr., et al., 2013) is: 1.7%   Values used to calculate the score:     Age: 54 years     Sex: Female     Is Non-Hispanic African American: No     Diabetic: No     Tobacco smoker: No     Systolic Blood Pressure: 505 mmHg     Is BP treated: No     HDL Cholesterol: 53 mg/dL     Total Cholesterol: 183 mg/dL Using medications without problems: none Muscle aches:  Diet compliance: moderate Exercise: minimal Other complaints:   This visit occurred during the SARS-CoV-2 public health emergency.  Safety protocols were in place, including screening questions prior to the visit, additional usage of staff PPE, and extensive cleaning of exam room while observing appropriate contact time as indicated for disinfecting solutions.   COVID 19 screen:  No recent travel or known exposure to COVID19 The patient denies respiratory symptoms of COVID  19 at this time. The importance of social distancing was discussed today.     Review of Systems  Constitutional: Negative for chills and fever.  HENT: Negative for congestion and ear pain.   Eyes: Negative for pain and redness.  Respiratory: Negative for cough and shortness of breath.   Cardiovascular: Negative for chest pain, palpitations and leg swelling.  Gastrointestinal: Negative for abdominal pain, blood in stool, constipation, diarrhea, nausea and vomiting.  Genitourinary: Negative for dysuria.  Musculoskeletal: Negative for falls and myalgias.  Skin: Negative for rash.  Neurological: Negative for dizziness.  Psychiatric/Behavioral: Negative for depression. The patient is not nervous/anxious.       Past Medical History:  Diagnosis Date  . Allergy   . Anxiety   . GERD (gastroesophageal reflux disease)   . Hyperlipidemia   . Migraines     reports that she has quit smoking. She has never used smokeless tobacco. She reports current alcohol use. She reports that she does not use drugs.   Current Outpatient Medications:  .  acetaminophen (TYLENOL) 500 MG tablet, Take 1,000 mg by mouth every 6 (six) hours as needed., Disp: , Rfl:  .  atorvastatin (LIPITOR) 10 MG tablet, Take 10 mg by mouth every other day., Disp: , Rfl:  .  Calcium Carbonate-Vitamin D (CALCIUM 600+D) 600-400 MG-UNIT tablet, Take 2 tablets by mouth daily., Disp: , Rfl:  .  cetirizine (  ALLERGY 24HOUR INDOOR/OUTDOOR) 10 MG tablet, Take 10 mg by mouth daily. , Disp: , Rfl:  .  EPINEPHrine 0.3 mg/0.3 mL IJ SOAJ injection, Inject into the muscle., Disp: , Rfl:  .  fluticasone (FLONASE) 50 MCG/ACT nasal spray, Place 1 spray into both nostrils daily. , Disp: , Rfl:  .  hydrOXYzine (ATARAX/VISTARIL) 50 MG tablet, Take 1 tablet (50 mg total) by mouth 3 (three) times daily as needed for anxiety or nausea., Disp: 30 tablet, Rfl: 0 .  vitamin B-12 (CYANOCOBALAMIN) 1000 MCG tablet, Take 1,000 mcg by mouth daily., Disp: , Rfl:   .  pantoprazole (PROTONIX) 40 MG tablet, Take 1 tablet (40 mg total) by mouth daily. (Patient not taking: Reported on 02/13/2020), Disp: 30 tablet, Rfl: 3   Observations/Objective: Blood pressure 122/84, pulse 78, temperature 98.5 F (36.9 C), height 5' 6.54" (1.69 m), weight 158 lb (71.7 kg), last menstrual period 03/23/2013, SpO2 97 %.  Body mass index is 25.09 kg/m.  Physical Exam Constitutional:      General: She is not in acute distress.    Appearance: Normal appearance. She is well-developed. She is not ill-appearing or toxic-appearing.  HENT:     Head: Normocephalic.     Right Ear: Hearing, tympanic membrane, ear canal and external ear normal.     Left Ear: Hearing, tympanic membrane, ear canal and external ear normal.     Nose: Nose normal.  Eyes:     General: Lids are normal. Lids are everted, no foreign bodies appreciated.     Conjunctiva/sclera: Conjunctivae normal.     Pupils: Pupils are equal, round, and reactive to light.  Neck:     Thyroid: No thyroid mass or thyromegaly.     Vascular: No carotid bruit.     Trachea: Trachea normal.  Cardiovascular:     Rate and Rhythm: Normal rate and regular rhythm.     Heart sounds: Normal heart sounds, S1 normal and S2 normal. No murmur heard.  No gallop.   Pulmonary:     Effort: Pulmonary effort is normal. No respiratory distress.     Breath sounds: Normal breath sounds. No wheezing, rhonchi or rales.  Abdominal:     General: Bowel sounds are normal. There is no distension or abdominal bruit.     Palpations: Abdomen is soft. There is no fluid wave or mass.     Tenderness: There is no abdominal tenderness. There is no guarding or rebound.     Hernia: No hernia is present.  Genitourinary:    Exam position: Supine.     Labia:        Right: No rash, tenderness or lesion.        Left: No rash, tenderness or lesion.      Vagina: Normal.     Cervix: No cervical motion tenderness, discharge or friability.     Uterus: Not  enlarged and not tender.      Adnexa:        Right: No mass, tenderness or fullness.         Left: No mass, tenderness or fullness.    Musculoskeletal:     Cervical back: Normal range of motion and neck supple.  Lymphadenopathy:     Cervical: No cervical adenopathy.  Skin:    General: Skin is warm and dry.     Findings: No rash.  Neurological:     Mental Status: She is alert.     Cranial Nerves: No cranial nerve deficit.       Sensory: No sensory deficit.  Psychiatric:        Mood and Affect: Mood is not anxious or depressed.        Speech: Speech normal.        Behavior: Behavior normal. Behavior is cooperative.        Judgment: Judgment normal.      Assessment and Plan The patient's preventative maintenance and recommended screening tests for an annual wellness exam were reviewed in full today. Brought up to date unless services declined.  Counselled on the importance of diet, exercise, and its role in overall health and mortality. The patient's FH and SH was reviewed, including their home life, tobacco status, and drug and alcohol status.   Vaccines: Uptodate COVID series,  Due for Tdap, flu at work Mammo: 10/2020nml, sister with breast cancer. BRCA1 and BRCA 2 neg. Smoking:no STD screen/ HIV : refused. PAP/DVE: Last nml 2016 neg co testing, repeat in 5 years, NoDVE indicated, asymptomatic. Due. Colon: colonoscopy 10.2019repeat in 5 years. Hep C: consider in future   Tachycardia On ZIO monitor.  HYPERCHOLESTEROLEMIA Well controlled. Continue current medication.   Vasovagal syncope No further episodes.  GERD (gastroesophageal reflux disease) Stable control on no meds, can use H2blocker prn     Eliezer Lofts, MD

## 2020-02-17 LAB — CYTOLOGY - PAP
Comment: NEGATIVE
Diagnosis: NEGATIVE
High risk HPV: NEGATIVE

## 2020-02-19 DIAGNOSIS — J301 Allergic rhinitis due to pollen: Secondary | ICD-10-CM | POA: Diagnosis not present

## 2020-02-23 DIAGNOSIS — J301 Allergic rhinitis due to pollen: Secondary | ICD-10-CM | POA: Diagnosis not present

## 2020-03-03 ENCOUNTER — Other Ambulatory Visit: Payer: Self-pay

## 2020-03-08 DIAGNOSIS — J301 Allergic rhinitis due to pollen: Secondary | ICD-10-CM | POA: Diagnosis not present

## 2020-03-11 ENCOUNTER — Other Ambulatory Visit: Payer: Self-pay

## 2020-03-11 ENCOUNTER — Encounter: Payer: Self-pay | Admitting: Family

## 2020-03-11 ENCOUNTER — Ambulatory Visit (INDEPENDENT_AMBULATORY_CARE_PROVIDER_SITE_OTHER): Payer: 59 | Admitting: Family

## 2020-03-11 VITALS — BP 122/86 | HR 88 | Ht 66.5 in | Wt 161.0 lb

## 2020-03-11 DIAGNOSIS — J301 Allergic rhinitis due to pollen: Secondary | ICD-10-CM | POA: Diagnosis not present

## 2020-03-11 DIAGNOSIS — I491 Atrial premature depolarization: Secondary | ICD-10-CM

## 2020-03-11 DIAGNOSIS — R002 Palpitations: Secondary | ICD-10-CM | POA: Diagnosis not present

## 2020-03-11 DIAGNOSIS — R55 Syncope and collapse: Secondary | ICD-10-CM | POA: Diagnosis not present

## 2020-03-11 DIAGNOSIS — I493 Ventricular premature depolarization: Secondary | ICD-10-CM | POA: Diagnosis not present

## 2020-03-11 NOTE — Patient Instructions (Signed)
Medication Instructions:  No medication changes today.   *If you need a refill on your cardiac medications before your next appointment, please call your pharmacy*  Lab Work: None ordered today.   Testing/Procedures: Your echocardiogram in the hospital showed normal heart pumping function and normal heart valves.  Your ZIO monitor showed mostly normal sinus rhythm. It showed rare (less than 1%) early heart beats called PVC/PAC. These are not dangerous. It showed 10 episodes of a fast heart beat. If these are bothering you more often we can consider an as-needed medication called Propranolol.   Follow-Up: At [facility], you and your health needs are our priority.  As part of our continuing mission to provide you with exceptional heart care, we have created designated Provider Care Teams.  These Care Teams include your primary Cardiologist (physician) and Advanced Practice Providers (APPs -  Physician Assistants and Nurse Practitioners) who all work together to provide you with the care you need, when you need it.  We recommend signing up for the patient portal called "MyChart".  Sign up information is provided on this After Visit Summary.  MyChart is used to connect with patients for Virtual Visits (Telemedicine).  Patients are able to view lab/test results, encounter notes, upcoming appointments, etc.  Non-urgent messages can be sent to your provider as well.   To learn more about what you can do with MyChart, go to [email or web address].    Your next appointment:   6 month(s)  The format for your next appointment:   In Person  Provider:   You may see Kate Sable, MD or one of the following Advanced Practice Providers on your designated Care Team:    Murray Hodgkins, NP  Laurann Montana, NP  Christell Faith, PA-C  Marrianne Mood, PA-C  Cadence Kathlen Mody, [place]  Other Instructions   Continue to stay well hydrate and avoid excessive caffeine.   Palpitations Palpitations  are feelings that your heartbeat is not normal. Your heartbeat may feel like it is:  Uneven.  Faster than normal.  Fluttering.  Skipping a beat. This is usually not a serious problem. In some cases, you may need tests to rule out any serious problems. Follow these instructions at home: Pay attention to any changes in your condition. Take these actions to help manage your symptoms: Eating and drinking  Avoid: ? Coffee, tea, soft drinks, and energy drinks. ? Chocolate. ? Alcohol. ? Diet pills. Lifestyle   Try to lower your stress. These things can help you relax: ? Yoga. ? Deep breathing and meditation. ? Exercise. ? Using words and images to create positive thoughts (guided imagery). ? Using your mind to control things in your body (biofeedback).  Do not use drugs.  Get plenty of rest and sleep. Keep a regular bed time. General instructions   Take over-the-counter and prescription medicines only as told by your doctor.  Do not use any products that contain nicotine or tobacco, such as cigarettes and e-cigarettes. If you need help quitting, ask your doctor.  Keep all follow-up visits as told by your doctor. This is important. You may need more tests if palpitations do not go away or get worse. Contact a doctor if:  Your symptoms last more than 24 hours.  Your symptoms occur more often. Get help right away if you:  Have chest pain.  Feel short of breath.  Have a very bad headache.  Feel dizzy.  Pass out (faint). Summary  Palpitations are feelings that your heartbeat is uneven or  faster than normal. It may feel like your heart is fluttering or skipping a beat.  Avoid food and drinks that may cause palpitations. These include caffeine, chocolate, and alcohol.  Try to lower your stress. Do not smoke or use drugs.  Get help right away if you faint or have chest pain, shortness of breath, a severe headache, or dizziness. This information is not intended to  replace advice given to you by your health care provider. Make sure you discuss any questions you have with your health care provider. Document Revised: 07/11/2017 Document Reviewed: 07/11/2017 Elsevier Patient Education  2020 [employer].

## 2020-03-11 NOTE — Progress Notes (Signed)
Office Visit    Patient Name: [name] Date of Encounter: 03/12/2020  Primary Care Provider:  Jinny Sanders, MD Primary Cardiologist:  Kate Sable, MD Electrophysiologist:  None   Chief Complaint    Joyce Brock is a 54 y.o. female with a hx of GERD, anxiety, hyperlipidemia, syncope, palpitations presents today for follow-up after ZIO monitor  Past Medical History    Past Medical History:  Diagnosis Date  . Allergy   . Anxiety   . GERD (gastroesophageal reflux disease)   . Hyperlipidemia   . Migraines    Past Surgical History:  Procedure Laterality Date  . CHOLECYSTECTOMY      Allergies  No Known Allergies  History of Present Illness    Joyce Brock is a 54 y.o. female with a hx of GERD, anxiety, hyperlipidemia, syncope, palpitations last seen while in the emergency department 01/11/2020.  Previous stress echo in 2017 was normal.  She was seen in consult 01/11/2020 by Dr. Stanford Breed and they ED.  The day prior she was having difficulties with nausea and decreased p.o. intake.  She was sitting on her couch and developed severe nausea, diaphoresis and had syncope for approximately 10 to 15 seconds.  Episode was presumed to be vagal.  She was recommended for echocardiogram as well as ZIO monitor.  Echo 01/11/2020 LVEF 60 to 38%, grade 1 diastolic dysfunction, no significant valvular abnormalities.  She wore a 2-week ZIO AT monitor which showed predominantly normal sinus rhythm.  She had rare PAC/PVC brief paroxysmal atrial tachycardia (longest 10 beats).  She reports feeling overall well since last seen.  Reports no recurrent lightheadedness, dizziness, near-syncope, syncope.  She has been staying well-hydrated and trying to avoid caffeine.  She does note continued occasional palpitations or sensation of fast heart rate.  She has not been monitoring her heart rate routinely at home.  No chest pain, pressure, tightness.  No shortness of breath at rest no  dyspnea on exertion.  Has been having acid reflux.  Previously on Protonix but had muscle cramps.  She is taking Nexium as needed with good relief.  Does endorse recent stress due to selling her and her husband's home and moving into her son's home while waiting on the new home to be finished.  She attributes lots of stress through the Covid pandemic and plans to continue to follow with her primary care provider.  She attributes some of her tachycardia to stress.  No formal exercise routine.  Interested in starting walking regimen.  She has upcoming trip to [place] in January with her daughter and sister and is hopeful to be able to participate in a 5K at that time. [employer] in January  EKGs/Labs/Other Studies Reviewed:   The following studies were reviewed today:  Echo 01/11/20  1. Left ventricular ejection fraction, by estimation, is 60 to 65%. The  left ventricle has normal function. The left ventricle has no regional  wall motion abnormalities. Left ventricular diastolic parameters are  consistent with Grade I diastolic  dysfunction (impaired relaxation).   2. Right ventricular systolic function is normal. The right ventricular  size is normal. Tricuspid regurgitation signal is inadequate for assessing  PA pressure.   3. The mitral valve is normal in structure. No evidence of mitral valve  regurgitation. No evidence of mitral stenosis.   4. The aortic valve has an indeterminant number of cusps. Aortic valve  regurgitation is not visualized. No aortic stenosis is present.  5. The inferior vena cava is normal in size with greater than 50%  respiratory variability, suggesting right atrial pressure of 3 mmHg.   ZIO monitor 03/03/20 Sinus bradycardia, normal sinus rhythm, sinus tachycardia, occasional PACs, brief PAT (longest 10 beats) and occasional PVC.   EKG:  No EKG today  Recent Labs: 01/10/2020: Hemoglobin 13.9; Platelets 156 01/11/2020: TSH 0.864 02/12/2020: ALT 28; BUN 11;  Creatinine, Ser 0.83; Potassium 3.7; Sodium 139  Recent Lipid Panel    Component Value Date/Time   CHOL 183 02/12/2020 0734   TRIG 113.0 02/12/2020 0734   HDL 53.00 02/12/2020 0734   CHOLHDL 3 02/12/2020 0734   VLDL 22.6 02/12/2020 0734   LDLCALC 108 (H) 02/12/2020 0734   LDLDIRECT 168.7 03/22/2012 0832    Home Medications   Current Meds  Medication Sig  . acetaminophen (TYLENOL) 500 MG tablet Take 1,000 mg by mouth every 6 (six) hours as needed.  Marland Kitchen atorvastatin (LIPITOR) 10 MG tablet Take 10 mg by mouth every other day.  . Calcium Carbonate-Vitamin D (CALCIUM 600+D) 600-400 MG-UNIT tablet Take 2 tablets by mouth daily.  . cetirizine (ALLERGY 24HOUR INDOOR/OUTDOOR) 10 MG tablet Take 10 mg by mouth daily.   Marland Kitchen EPINEPHrine 0.3 mg/0.3 mL IJ SOAJ injection Inject into the muscle.  . fluticasone (FLONASE) 50 MCG/ACT nasal spray Place 1 spray into both nostrils daily.   . hydrOXYzine (ATARAX/VISTARIL) 50 MG tablet Take 1 tablet (50 mg total) by mouth 3 (three) times daily as needed for anxiety or nausea.  . vitamin B-12 (CYANOCOBALAMIN) 1000 MCG tablet Take 1,000 mcg by mouth daily.      Review of Systems       Review of Systems  Constitutional: Negative for chills, fever and malaise/fatigue.  Cardiovascular: Positive for palpitations. Negative for chest pain, dyspnea on exertion, irregular heartbeat, leg swelling, near-syncope, orthopnea and syncope.  Respiratory: Negative for cough, shortness of breath and wheezing.   Gastrointestinal: Negative for melena, nausea and vomiting.  Genitourinary: Negative for hematuria.  Neurological: Negative for dizziness, light-headedness and weakness.  Psychiatric/Behavioral: The patient is nervous/anxious.    All other systems reviewed and are otherwise negative except as noted above.  Physical Exam    VS:  BP 122/86 (BP Location: Left Arm, Patient Position: Sitting, Cuff Size: Normal)   Pulse 88   Ht 5' 6.5" (1.689 m)   Wt 161 lb (73 kg)    LMP 03/23/2013   SpO2 98%   BMI 25.60 kg/m  , BMI Body mass index is 25.6 kg/m. GEN: Well nourished, well developed, in no acute distress. HEENT: normal. Neck: Supple, no JVD, carotid bruits, or masses. Cardiac: RRR, no murmurs, rubs, or gallops. No clubbing, cyanosis, edema.  Radials/DP/PT 2+ and equal bilaterally.  Respiratory:  Respirations regular and unlabored, clear to auscultation bilaterally. GI: Soft, nontender, nondistended, BS + x 4. MS: No deformity or atrophy. Skin: Warm and dry, no rash. Neuro:  Strength and sensation are intact. Psych: Normal affect.  Assessment & Plan    1. Syncope -No recurrence.  Previous episode likely vasovagal in setting of dehydration.  Echo with no significant valvular abnormalities and normal LVEF.  2-week ZIO monitor with no significant arrhythmia, nor pause.  No indication for further work-up at this time.  2. Palpitations/PVC/PAC/PAT -ZIO monitor with predominant normal sinus rhythm, rare PVC/PAC, brief runs of PAT (longest 10 beats).  Reports occasional palpitations.  Encouraged to continue to stay well-hydrated, avoid caffeine.  We discussed the role of stress in palpitations.  We discussed the possibility of PRN Propranolol, but at this time she understandably prefers to proceed with lifestyle changes.   3. Anxiety -discussed role of anxiety and palpitations.  Continue to follow with PCP.  Disposition: Follow up in 6 month(s) with Dr. Garen Lah or APP. As she saw Dr. Stanford Breed in ED, will establish with cardiologist in [place] location.    Loel Dubonnet, NP 03/12/2020, 9:48 AM

## 2020-03-15 DIAGNOSIS — J301 Allergic rhinitis due to pollen: Secondary | ICD-10-CM | POA: Diagnosis not present

## 2020-03-22 DIAGNOSIS — J301 Allergic rhinitis due to pollen: Secondary | ICD-10-CM | POA: Diagnosis not present

## 2020-03-24 ENCOUNTER — Other Ambulatory Visit: Payer: Self-pay | Admitting: Family Medicine

## 2020-03-24 MED FILL — CETIRIZINE HCL 10 MG TABS: 10 | 90 days supply | Qty: 90 | Fill #2

## 2020-03-24 MED FILL — ATORVASTATIN CALCIUM 10 MG: 10 | 90 days supply | Qty: 90 | Fill #0

## 2020-03-24 MED FILL — FLUTICASONE PROP 50 MCG SPR: 50 | 90 days supply | Qty: 48 | Fill #1

## 2020-03-25 DIAGNOSIS — J301 Allergic rhinitis due to pollen: Secondary | ICD-10-CM | POA: Diagnosis not present

## 2020-03-29 DIAGNOSIS — J301 Allergic rhinitis due to pollen: Secondary | ICD-10-CM | POA: Diagnosis not present

## 2020-03-31 NOTE — Progress Notes (Signed)
NEUROLOGY CONSULTATION NOTE  Joyce Brock MRN: [id number] DOB: Aug 13, 1965  Referring provider: Beverly Gust, MD Primary care provider: Eliezer Lofts, MD  Reason for consult:  headaches  HISTORY OF PRESENT ILLNESS: Joyce Brock is a 54 year old right-handed female with HTN and GERD who presents for headaches.  History supplemented by referring provider's note.  She has had migraines "all of my life".  They were particularly bad in highschool but became manageable.  However, the became worse and more frequent a couple of years ago.  She endorsed severe right periorbital/maxillary pressure pain associated with nausea, photophobia, phonophobia, and osmophobia.  No vomiting, visual disturbance, autonomic symptoms, speech disturbance, numbness or weakness.  She would typically wake up in the morning with them, starting as dull and gradually progressing by the afternoon.  They would last all day.  She would treat with Excedrin Migraine. They could be quite debilitating.  She would have head soreness for another day.  She was averaging between 7 and 10 a month.  Triggers include deli meat.  Resting helps.  She thought it may be related to her sinuses, so she was evaluated by an allergist.  Imaging of sinuses were clear.  She tested positive to allergy to certain trees and was diagnosed with allergic rhinitis.  She started allergy shots in August.  She also has cut out processed meats and caffeine and increased water intake.  They have since improved.  They are now dull and occur 2 to 3 times a month.  They respond quickly to Tylenol.    She was admitted to the hospital on 01/10/2020 for syncope.  It occurred when she was feeling nauseous, possibly due to acid reflux, and passed out.  CT head personally reviewed and was unremarkable.  Diagnosed with dehydration and vagal response.  She did have an outpatient cardiac event monitor which showed some rare PVCs but nothing significant.  However,  she did not have an event.  Since then, she has had a couple of near-syncopal episodes where she feels lightheaded and vision becomes black.  It has occurred just standing and talking.  She reports associated palpitations.  Current NSAIDS/analgesics:  Tylenol Current triptans:  none Current ergotamine:  none Current anti-emetic:  none Current muscle relaxants:  none Current Antihypertensive medications:  none Current Antidepressant medications:  none Current Anticonvulsant medications:  none Current anti-CGRP:  none Current Vitamins/Herbal/Supplements:  B12, Ca-D Current Antihistamines/Decongestants:  Flonase, cetirizine Other therapy:  none Hormone/birth control:  None Other:  hydroxyzine  Past NSAIDS/analgesics:  Excedrin Migraine Past abortive triptans:  none Past abortive ergotamine:  none Past muscle relaxants:  none Past anti-emetic:  none Past antihypertensive medications:  none Past antidepressant medications:  none Past anticonvulsant medications:  none Past anti-CGRP:  none Past vitamins/Herbal/Supplements:  none Past antihistamines/decongestants:  none Other past therapies:  none  Caffeine:  Occasional decaf Diet:  Hydrates, eliminated deli meats Exercise:  Not routine Depression:  no; Anxiety:  no Sleep hygiene:  Good.  Tested negative for OSA Family history of headache:  Mother (migraines)   PAST MEDICAL HISTORY: Past Medical History:  Diagnosis Date  . Allergy   . Anxiety   . GERD (gastroesophageal reflux disease)   . Hyperlipidemia   . Migraines     PAST SURGICAL HISTORY: Past Surgical History:  Procedure Laterality Date  . CHOLECYSTECTOMY      MEDICATIONS: Current Outpatient Medications on File Prior to Visit  Medication Sig Dispense Refill  . acetaminophen (TYLENOL) 500 MG  tablet Take 1,000 mg by mouth every 6 (six) hours as needed.    Marland Kitchen atorvastatin (LIPITOR) 10 MG tablet TAKE 1 TABLET BY MOUTH DAILY. 90 tablet 3  . Calcium  Carbonate-Vitamin D (CALCIUM 600+D) 600-400 MG-UNIT tablet Take 2 tablets by mouth daily.    . cetirizine (ALLERGY 24HOUR INDOOR/OUTDOOR) 10 MG tablet Take 10 mg by mouth daily.     Marland Kitchen EPINEPHrine 0.3 mg/0.3 mL IJ SOAJ injection Inject into the muscle.    . fluticasone (FLONASE) 50 MCG/ACT nasal spray Place 1 spray into both nostrils daily.     . hydrOXYzine (ATARAX/VISTARIL) 50 MG tablet Take 1 tablet (50 mg total) by mouth 3 (three) times daily as needed for anxiety or nausea. 30 tablet 0  . vitamin B-12 (CYANOCOBALAMIN) 1000 MCG tablet Take 1,000 mcg by mouth daily.     No current facility-administered medications on file prior to visit.    ALLERGIES: No Known Allergies  FAMILY HISTORY: Family History  Problem Relation Age of Onset  . Osteoporosis Mother   . Irritable bowel syndrome Mother   . Cancer Sister 33       breast   . Stomach cancer Paternal Grandfather   . Colon cancer Neg Hx   . Esophageal cancer Neg Hx   . Rectal cancer Neg Hx    .  SOCIAL HISTORY: Social History   Socioeconomic History  . Marital status: Married    Spouse name: Not on file  . Number of children: 2  . Years of education: Not on file  . Highest education level: Not on file  Occupational History  . Not on file  Tobacco Use  . Smoking status: Former Research scientist (life sciences)  . Smokeless tobacco: Never Used  Vaping Use  . Vaping Use: Never used  Substance and Sexual Activity  . Alcohol use: Yes    Comment: Rare  . Drug use: No  . Sexual activity: Not on file  Other Topics Concern  . Not on file  Social History Narrative  . Not on file   Social Determinants of Health   Financial Resource Strain:   . Difficulty of Paying Living Expenses: Not on file  Food Insecurity:   . Worried About Charity fundraiser in the Last Year: Not on file  . Ran Out of Food in the Last Year: Not on file  Transportation Needs:   . Lack of Transportation (Medical): Not on file  . Lack of Transportation (Non-Medical): Not  on file  Physical Activity:   . Days of Exercise per Week: Not on file  . Minutes of Exercise per Session: Not on file  Stress:   . Feeling of Stress : Not on file  Social Connections:   . Frequency of Communication with Friends and Family: Not on file  . Frequency of Social Gatherings with Friends and Family: Not on file  . Attends Religious Services: Not on file  . Active Member of Clubs or Organizations: Not on file  . Attends Archivist Meetings: Not on file  . Marital Status: Not on file  Intimate Partner Violence:   . Fear of Current or Ex-Partner: Not on file  . Emotionally Abused: Not on file  . Physically Abused: Not on file  . Sexually Abused: Not on file    PHYSICAL EXAM: Blood pressure (!) 134/96, pulse 98, resp. rate 20, height 5\' 7"  (1.702 m), weight 161 lb (73 kg), last menstrual period 03/23/2013, SpO2 100 %. General: No acute distress.  Patient appears well-groomed.  Head:  Normocephalic/atraumatic Eyes:  fundi examined but not visualized Neck: supple, no paraspinal tenderness, full range of motion Back: No paraspinal tenderness Heart: regular rate and rhythm Lungs: Clear to auscultation bilaterally. Vascular: No carotid bruits. Neurological Exam: Mental status: alert and oriented to person, place, and time, recent and remote memory intact, fund of knowledge intact, attention and concentration intact, speech fluent and not dysarthric, language intact. Cranial nerves: CN I: not tested CN II: pupils equal, round and reactive to light, visual fields intact CN III, IV, VI:  full range of motion, no nystagmus, no ptosis CN V: facial sensation intact CN VII: upper and lower face symmetric CN VIII: hearing intact CN IX, X: gag intact, uvula midline CN XI: sternocleidomastoid and trapezius muscles intact CN XII: tongue midline Bulk & Tone: normal, no fasciculations. Motor:  5/5 throughout  Sensation:  Pinprick and vibration sensation intact. Deep Tendon  Reflexes:  2+ throughout, toes downgoing.  Finger to nose testing:  Without dysmetria.  Heel to shin:  Without dysmetria.  Gait:  Normal station and stride.  Able to turn and tandem walk. Romberg negative.  IMPRESSION: Migraine without aura, without status migrainosus, not intractable Syncope/near syncope  PLAN: 1.  She will continue current lifestyle changes and try to increase exercise.  May use OTC analgesics but Limit use of pain relievers to no more than 2 days out of week to prevent risk of rebound or medication-overuse headache.  Keep headache diary.  If headaches worsen, she will follow up. 2.  Regarding the near syncopal episodes, if they should continue to occur I recommend following up with cardiology.  Thank you for allowing me to take part in the care of this patient.  Metta Clines, DO  CC:  Beverly Gust, MD  Eliezer Lofts, MD

## 2020-04-01 DIAGNOSIS — R519 Headache, unspecified: Secondary | ICD-10-CM | POA: Diagnosis not present

## 2020-04-01 DIAGNOSIS — J309 Allergic rhinitis, unspecified: Secondary | ICD-10-CM | POA: Diagnosis not present

## 2020-04-02 ENCOUNTER — Ambulatory Visit (INDEPENDENT_AMBULATORY_CARE_PROVIDER_SITE_OTHER): Payer: 59 | Admitting: Neurology

## 2020-04-02 ENCOUNTER — Other Ambulatory Visit: Payer: Self-pay

## 2020-04-02 ENCOUNTER — Encounter: Payer: Self-pay | Admitting: Neurology

## 2020-04-02 VITALS — BP 134/96 | HR 98 | Resp 20 | Ht 67.0 in | Wt 161.0 lb

## 2020-04-02 DIAGNOSIS — G43009 Migraine without aura, not intractable, without status migrainosus: Secondary | ICD-10-CM

## 2020-04-02 DIAGNOSIS — J301 Allergic rhinitis due to pollen: Secondary | ICD-10-CM | POA: Diagnosis not present

## 2020-04-02 NOTE — Patient Instructions (Signed)
   1. Limit use of pain relievers to no more than 2 days out of the week.  These medications include acetaminophen, NSAIDs (ibuprofen/Advil/Motrin, naproxen/Aleve, triptans (Imitrex/sumatriptan), Excedrin, and narcotics.  This will help reduce risk of rebound headaches. 2. Be aware of common food triggers:  - Caffeine:  coffee, black tea, cola, Mt. Dew  - Chocolate  - Dairy:  aged cheeses (brie, blue, cheddar, gouda, [place], provolone, [place], Swiss, etc), chocolate milk, buttermilk, sour cream, limit eggs and yogurt  - Nuts, peanut butter  - Alcohol  - Cereals/grains:  FRESH breads (fresh bagels, sourdough, doughnuts), yeast productions  - Processed/canned/aged/cured meats (pre-packaged deli meats, hotdogs)  - MSG/glutamate:  soy sauce, flavor enhancer, pickled/preserved/marinated foods  - Sweeteners:  aspartame (Equal, Nutrasweet).  Sugar and Splenda are okay  - Vegetables:  legumes (lima beans, lentils, snow peas, fava beans, pinto peans, peas, garbanzo beans), sauerkraut, onions, olives, pickles  - Fruit:  avocados, bananas, citrus fruit (orange, lemon, grapefruit), mango  - Other:  Frozen meals, macaroni and cheese 3. Routine exercise 4. Stay adequately hydrated (aim for 64 oz water daily) 5. Keep headache diary 6. Maintain proper stress management 7. Maintain proper sleep hygiene 8. Do not skip meals 9. Consider supplements:  magnesium citrate 400mg  daily, riboflavin 400mg  daily, coenzyme Q10 100mg  three times daily.

## 2020-04-05 DIAGNOSIS — J301 Allergic rhinitis due to pollen: Secondary | ICD-10-CM | POA: Diagnosis not present

## 2020-04-08 DIAGNOSIS — J301 Allergic rhinitis due to pollen: Secondary | ICD-10-CM | POA: Diagnosis not present

## 2020-04-08 DIAGNOSIS — Z1231 Encounter for screening mammogram for malignant neoplasm of breast: Secondary | ICD-10-CM | POA: Diagnosis not present

## 2020-04-08 LAB — HM MAMMOGRAPHY

## 2020-04-12 ENCOUNTER — Encounter: Payer: Self-pay | Admitting: Family Medicine

## 2020-04-12 DIAGNOSIS — J301 Allergic rhinitis due to pollen: Secondary | ICD-10-CM | POA: Diagnosis not present

## 2020-04-15 DIAGNOSIS — J301 Allergic rhinitis due to pollen: Secondary | ICD-10-CM | POA: Diagnosis not present

## 2020-04-19 DIAGNOSIS — J301 Allergic rhinitis due to pollen: Secondary | ICD-10-CM | POA: Diagnosis not present

## 2020-04-22 DIAGNOSIS — J301 Allergic rhinitis due to pollen: Secondary | ICD-10-CM | POA: Diagnosis not present

## 2020-04-23 DIAGNOSIS — J301 Allergic rhinitis due to pollen: Secondary | ICD-10-CM | POA: Diagnosis not present

## 2020-04-24 DIAGNOSIS — Z23 Encounter for immunization: Secondary | ICD-10-CM | POA: Diagnosis not present

## 2020-04-29 DIAGNOSIS — J301 Allergic rhinitis due to pollen: Secondary | ICD-10-CM | POA: Diagnosis not present

## 2020-05-03 DIAGNOSIS — J301 Allergic rhinitis due to pollen: Secondary | ICD-10-CM | POA: Diagnosis not present

## 2020-05-10 DIAGNOSIS — J301 Allergic rhinitis due to pollen: Secondary | ICD-10-CM | POA: Diagnosis not present

## 2020-05-13 DIAGNOSIS — J301 Allergic rhinitis due to pollen: Secondary | ICD-10-CM | POA: Diagnosis not present

## 2020-05-17 DIAGNOSIS — J301 Allergic rhinitis due to pollen: Secondary | ICD-10-CM | POA: Diagnosis not present

## 2020-05-20 DIAGNOSIS — J301 Allergic rhinitis due to pollen: Secondary | ICD-10-CM | POA: Diagnosis not present

## 2020-05-27 DIAGNOSIS — J301 Allergic rhinitis due to pollen: Secondary | ICD-10-CM | POA: Diagnosis not present

## 2020-06-07 DIAGNOSIS — J301 Allergic rhinitis due to pollen: Secondary | ICD-10-CM | POA: Diagnosis not present

## 2020-06-14 DIAGNOSIS — Z20828 Contact with and (suspected) exposure to other viral communicable diseases: Secondary | ICD-10-CM | POA: Diagnosis not present

## 2020-07-01 DIAGNOSIS — J301 Allergic rhinitis due to pollen: Secondary | ICD-10-CM | POA: Diagnosis not present

## 2020-07-05 MED FILL — FLUTICASONE PROP 50 MCG SPR: 50 | 90 days supply | Qty: 48 | Fill #2

## 2020-07-05 MED FILL — ATORVASTATIN CALCIUM 10 MG: 10 | 90 days supply | Qty: 90 | Fill #1

## 2020-07-05 MED FILL — CETIRIZINE HCL 10 MG TABS: 10 | 90 days supply | Qty: 90 | Fill #3

## 2020-07-08 DIAGNOSIS — J301 Allergic rhinitis due to pollen: Secondary | ICD-10-CM | POA: Diagnosis not present

## 2020-07-15 ENCOUNTER — Ambulatory Visit (INDEPENDENT_AMBULATORY_CARE_PROVIDER_SITE_OTHER): Payer: 59 | Admitting: Sports Medicine

## 2020-07-15 ENCOUNTER — Other Ambulatory Visit: Payer: Self-pay

## 2020-07-15 ENCOUNTER — Encounter: Payer: Self-pay | Admitting: Sports Medicine

## 2020-07-15 DIAGNOSIS — B070 Plantar wart: Secondary | ICD-10-CM

## 2020-07-15 DIAGNOSIS — M79671 Pain in right foot: Secondary | ICD-10-CM | POA: Diagnosis not present

## 2020-07-15 DIAGNOSIS — J301 Allergic rhinitis due to pollen: Secondary | ICD-10-CM | POA: Diagnosis not present

## 2020-07-15 NOTE — Progress Notes (Signed)
Subjective: Joyce Brock is a 55 y.o. female patient who presents to office for evaluation of Right foot pain secondary to moderately painful wart at the ball of the foot. Patient has tried multiple treatments with Dr. Rolley Sims and then also saw Dr. Milinda Pointer who did a surgical excision but the wart still continues to come back and this time has been bothersome for the last 3 weeks, previously in the past has used Compound W but recently has not been applying any type of medication or treatment for her warts. Patient denies any other pedal complaints.   Review of Systems  All other systems reviewed and are negative.    Patient Active Problem List   Diagnosis Date Noted  . GERD (gastroesophageal reflux disease) 01/16/2020  . Tachycardia   . Vasovagal syncope 01/11/2020  . Pulmonary nodule   . GAD (generalized anxiety disorder) 10/04/2018  . Panic attack 10/04/2018  . Nausea 10/04/2018  . Atopic dermatitis 01/18/2017  . Left upper arm pain 09/25/2014  . Verruca vulgaris 11/15/2012  . Rectal pain 03/29/2012  . Palpitations 12/26/2011  . HYPERCHOLESTEROLEMIA 02/05/2009  . ALLERGIC RHINITIS 10/30/2008  . PSORIASIS 10/30/2008  . URINARY INCONTINENCE, MIXED 10/30/2008    Current Outpatient Medications on File Prior to Visit  Medication Sig Dispense Refill  . acetaminophen (TYLENOL) 500 MG tablet Take 1,000 mg by mouth every 6 (six) hours as needed.    Marland Kitchen atorvastatin (LIPITOR) 10 MG tablet TAKE 1 TABLET BY MOUTH DAILY. 90 tablet 3  . Calcium Carbonate-Vitamin D (CALCIUM 600+D) 600-400 MG-UNIT tablet Take 2 tablets by mouth daily.    . cetirizine (ALLERGY 24HOUR INDOOR/OUTDOOR) 10 MG tablet Take 10 mg by mouth daily.     Marland Kitchen EPINEPHrine 0.3 mg/0.3 mL IJ SOAJ injection Inject into the muscle.    . fluticasone (FLONASE) 50 MCG/ACT nasal spray Place 1 spray into both nostrils daily.     . hydrOXYzine (ATARAX/VISTARIL) 50 MG tablet Take 1 tablet (50 mg total) by mouth 3 (three) times daily as  needed for anxiety or nausea. 30 tablet 0  . vitamin B-12 (CYANOCOBALAMIN) 1000 MCG tablet Take 1,000 mcg by mouth daily.     No current facility-administered medications on file prior to visit.    No Known Allergies  Objective:  General: Alert and oriented x3 in no acute distress  Dermatology: Keratotic lesion present measuring less than 0.5 cm at ball of the right foot x3 with no skin lines transversing the lesion, pain is present with medial lateral pressure to the lesion, capillaries with pin point bleeding noted, no webspace macerations, no ecchymosis bilateral, all nails x 10 are well manicured.  Vascular: Dorsalis Pedis and Posterior Tibial pedal pulses 2/4, Capillary Fill Time 3 seconds, + pedal hair growth bilateral, no edema bilateral lower extremities, Temperature gradient within normal limits.  Neurology: Johney Maine sensation intact via light touch bilateral.  Musculoskeletal: Mild tenderness with palpation at the lesion site on Right forefoot, muscular strength 5/5 in all groups without pain or limitation on range of motion. No other lower extremity muscular or boney deformity noted.  Assessment and Plan: Problem List Items Addressed This Visit   None   Visit Diagnoses    Plantar wart    -  Primary   Right foot pain          -Complete examination performed -Discussed treatment options for wart -Parred keratoic warty lesions x3 to ball of right foot using a chisel blade; treated the area with Catharidin covered with bandaid;  Advised patient of blistering reaction that will occur from application of medication and once this happens replace bandaid with neosporin and tape/bandaid -Advised patient if her warts continue to be recurrent may consider adding on Tagamet at next visit -Patient to return to office as scheduled in 3 weeks or sooner if condition worsens.  Landis Martins, DPM

## 2020-07-16 DIAGNOSIS — J301 Allergic rhinitis due to pollen: Secondary | ICD-10-CM | POA: Diagnosis not present

## 2020-07-22 DIAGNOSIS — J301 Allergic rhinitis due to pollen: Secondary | ICD-10-CM | POA: Diagnosis not present

## 2020-07-29 DIAGNOSIS — J301 Allergic rhinitis due to pollen: Secondary | ICD-10-CM | POA: Diagnosis not present

## 2020-08-05 ENCOUNTER — Other Ambulatory Visit: Payer: Self-pay

## 2020-08-05 ENCOUNTER — Ambulatory Visit (INDEPENDENT_AMBULATORY_CARE_PROVIDER_SITE_OTHER): Payer: 59 | Admitting: Sports Medicine

## 2020-08-05 ENCOUNTER — Other Ambulatory Visit: Payer: Self-pay | Admitting: Sports Medicine

## 2020-08-05 ENCOUNTER — Encounter: Payer: Self-pay | Admitting: Sports Medicine

## 2020-08-05 DIAGNOSIS — M79671 Pain in right foot: Secondary | ICD-10-CM

## 2020-08-05 DIAGNOSIS — B070 Plantar wart: Secondary | ICD-10-CM

## 2020-08-05 DIAGNOSIS — J301 Allergic rhinitis due to pollen: Secondary | ICD-10-CM | POA: Diagnosis not present

## 2020-08-05 MED ORDER — CIMETIDINE 200 MG PO TABS
200.0000 mg | ORAL_TABLET | Freq: Two times a day (BID) | ORAL | 0 refills | Status: DC
Start: 2020-08-05 — End: 2020-08-05

## 2020-08-05 MED FILL — CIMETIDINE 200 MG TABLET: 200 | 14 days supply | Qty: 28 | Fill #0

## 2020-08-05 NOTE — Progress Notes (Signed)
Subjective: Joyce Brock is a 55 y.o. female patient returns to office for follow-up evaluation of right foot wart.  Patient reports that is not painful as it was last visit and seems to be getting a little better.  Patient reports that she did have some type of blister reaction and used a sterile needle and got some relief and noticed the skin started to crust and try to take a small trimmer to trim a little bit but was afraid to go to aggressive.  Patient denies nausea vomiting fever chills or any other constitutional symptoms at this time.    Patient Active Problem List   Diagnosis Date Noted  . GERD (gastroesophageal reflux disease) 01/16/2020  . Tachycardia   . Vasovagal syncope 01/11/2020  . Pulmonary nodule   . GAD (generalized anxiety disorder) 10/04/2018  . Panic attack 10/04/2018  . Nausea 10/04/2018  . Atopic dermatitis 01/18/2017  . Left upper arm pain 09/25/2014  . Verruca vulgaris 11/15/2012  . Rectal pain 03/29/2012  . Palpitations 12/26/2011  . HYPERCHOLESTEROLEMIA 02/05/2009  . ALLERGIC RHINITIS 10/30/2008  . PSORIASIS 10/30/2008  . URINARY INCONTINENCE, MIXED 10/30/2008    Current Outpatient Medications on File Prior to Visit  Medication Sig Dispense Refill  . acetaminophen (TYLENOL) 500 MG tablet Take 1,000 mg by mouth every 6 (six) hours as needed.    Marland Kitchen atorvastatin (LIPITOR) 10 MG tablet TAKE 1 TABLET BY MOUTH DAILY. 90 tablet 3  . Calcium Carbonate-Vitamin D (CALCIUM 600+D) 600-400 MG-UNIT tablet Take 2 tablets by mouth daily.    . cetirizine (ALLERGY 24HOUR INDOOR/OUTDOOR) 10 MG tablet Take 10 mg by mouth daily.     Marland Kitchen EPINEPHrine 0.3 mg/0.3 mL IJ SOAJ injection Inject into the muscle.    . fluticasone (FLONASE) 50 MCG/ACT nasal spray Place 1 spray into both nostrils daily.     . hydrOXYzine (ATARAX/VISTARIL) 50 MG tablet Take 1 tablet (50 mg total) by mouth 3 (three) times daily as needed for anxiety or nausea. 30 tablet 0  . vitamin B-12  (CYANOCOBALAMIN) 1000 MCG tablet Take 1,000 mcg by mouth daily.     No current facility-administered medications on file prior to visit.    No Known Allergies  Objective:  General: Alert and oriented x3 in no acute distress  Dermatology: Keratotic lesion present measuring less than 0.5 cm at ball of the right foot x3 with no skin lines transversing the lesion, once debrided the capillaries do appear to be very minimal, pain is present with medial lateral pressure to the lesion, capillaries with pin point bleeding noted, no webspace macerations, no ecchymosis bilateral, all nails x 10 are well manicured.  Vascular: Dorsalis Pedis and Posterior Tibial pedal pulses 2/4, Capillary Fill Time 3 seconds, + pedal hair growth bilateral, no edema bilateral lower extremities, Temperature gradient within normal limits.  Neurology: Johney Maine sensation intact via light touch bilateral.  Musculoskeletal: Decreased tenderness with palpation at the lesion site on Right forefoot, muscular strength 5/5 in all groups without pain or limitation on range of motion. No other lower extremity muscular or boney deformity noted.  Assessment and Plan: Problem List Items Addressed This Visit   None   Visit Diagnoses    Plantar wart    -  Primary   Right foot pain          -Complete examination performed -Re-Discussed treatment options for wart -Parred keratoic warty lesions x3 to ball of right foot using a chisel blade; treated the area with Catharidin covered with  bandaid; Advised patient of blistering reaction that will occur from application of medication and once this happens replace bandaid with neosporin and tape/bandaid.  This is treatment #2 to the area -Rx Tagamet and advised patient to stop taking Prilosec for the next 2 weeks -Encouraged good hygiene habits to prevent reinfection or worsening of wart -Patient to return to office as scheduled in 3 to 4 weeks for wart follow-up or sooner if condition  worsens.  Landis Martins, DPM

## 2020-08-12 DIAGNOSIS — J301 Allergic rhinitis due to pollen: Secondary | ICD-10-CM | POA: Diagnosis not present

## 2020-08-12 DIAGNOSIS — H524 Presbyopia: Secondary | ICD-10-CM | POA: Diagnosis not present

## 2020-08-19 DIAGNOSIS — J301 Allergic rhinitis due to pollen: Secondary | ICD-10-CM | POA: Diagnosis not present

## 2020-08-24 ENCOUNTER — Telehealth: Payer: Self-pay | Admitting: Cardiology

## 2020-08-24 NOTE — Telephone Encounter (Signed)
Called patient to schedule follow up Patient has not had any other issues and no longer wishes to be seen Deleted recall

## 2020-08-26 ENCOUNTER — Encounter: Payer: Self-pay | Admitting: Sports Medicine

## 2020-08-26 ENCOUNTER — Ambulatory Visit (INDEPENDENT_AMBULATORY_CARE_PROVIDER_SITE_OTHER): Payer: 59 | Admitting: Sports Medicine

## 2020-08-26 ENCOUNTER — Other Ambulatory Visit: Payer: Self-pay

## 2020-08-26 DIAGNOSIS — B070 Plantar wart: Secondary | ICD-10-CM | POA: Diagnosis not present

## 2020-08-26 DIAGNOSIS — M79671 Pain in right foot: Secondary | ICD-10-CM

## 2020-08-26 DIAGNOSIS — J301 Allergic rhinitis due to pollen: Secondary | ICD-10-CM | POA: Diagnosis not present

## 2020-08-26 NOTE — Progress Notes (Signed)
Subjective: Joyce Brock is a 55 y.o. female patient returns to office for follow-up evaluation of right foot wart.  Patient reports that the ball of the foot was sore at 1st for 3-7/1 weeks after application but did get peeling and skin is doing batter. Reports that she did fine with the Tagamet. Patient denies constitutional symptoms at this time.  Patient Active Problem List   Diagnosis Date Noted  . GERD (gastroesophageal reflux disease) 01/16/2020  . Tachycardia   . Vasovagal syncope 01/11/2020  . Pulmonary nodule   . GAD (generalized anxiety disorder) 10/04/2018  . Panic attack 10/04/2018  . Nausea 10/04/2018  . Atopic dermatitis 01/18/2017  . Left upper arm pain 09/25/2014  . Verruca vulgaris 11/15/2012  . Rectal pain 03/29/2012  . Palpitations 12/26/2011  . HYPERCHOLESTEROLEMIA 02/05/2009  . ALLERGIC RHINITIS 10/30/2008  . PSORIASIS 10/30/2008  . URINARY INCONTINENCE, MIXED 10/30/2008    Current Outpatient Medications on File Prior to Visit  Medication Sig Dispense Refill  . acetaminophen (TYLENOL) 500 MG tablet Take 1,000 mg by mouth every 6 (six) hours as needed.    Marland Kitchen atorvastatin (LIPITOR) 10 MG tablet TAKE 1 TABLET BY MOUTH DAILY. 90 tablet 3  . Calcium Carbonate-Vitamin D (CALCIUM 600+D) 600-400 MG-UNIT tablet Take 2 tablets by mouth daily.    . cetirizine (ALLERGY 24HOUR INDOOR/OUTDOOR) 10 MG tablet Take 10 mg by mouth daily.     . cimetidine (TAGAMET HB) 200 MG tablet Take 1 tablet (200 mg total) by mouth 2 (two) times daily for 14 days. 28 tablet 0  . EPINEPHrine 0.3 mg/0.3 mL IJ SOAJ injection Inject into the muscle.    . fluticasone (FLONASE) 50 MCG/ACT nasal spray Place 1 spray into both nostrils daily.     . hydrOXYzine (ATARAX/VISTARIL) 50 MG tablet Take 1 tablet (50 mg total) by mouth 3 (three) times daily as needed for anxiety or nausea. 30 tablet 0  . vitamin B-12 (CYANOCOBALAMIN) 1000 MCG tablet Take 1,000 mcg by mouth daily.     No current  facility-administered medications on file prior to visit.    No Known Allergies  Objective:  General: Alert and oriented x3 in no acute distress  Dermatology: Minimal keratotic lesion present measuring less than 0.1 cm at ball of the right foot x3 with no skin lines transversing the lesion, +pink epithelized skin with good reaction noted to previous medication application, no webspace macerations, no ecchymosis bilateral, all nails x 10 are well manicured.  Vascular: Dorsalis Pedis and Posterior Tibial pedal pulses 2/4, Capillary Fill Time 3 seconds, + pedal hair growth bilateral, no edema bilateral lower extremities, Temperature gradient within normal limits.  Neurology: Johney Maine sensation intact via light touch bilateral.  Musculoskeletal: Decreased tenderness with palpation at the lesion site on Right forefoot, muscular strength 5/5 in all groups without pain or limitation on range of motion. No other lower extremity muscular or boney deformity noted.  Assessment and Plan: Problem List Items Addressed This Visit   None   Visit Diagnoses    Plantar wart    -  Primary   Right foot pain          -Complete examination performed -Re-Discussed treatment options for wart -To ball of right foot treated the warts x 3 with Catharidin covered with bandaid; Advised patient of blistering reaction that will occur from application of medication and once this happens replace bandaid with neosporin and tape/bandaid.  This is treatment #3 to the area -Tagamet completed with no issues -Encouraged  good hygiene ha bits to prevent reinfection or worsening of wart -Patient to return to office as scheduled in 4 weeks for wart follow-up if not completely resolved or sooner if condition worsens.  Landis Martins, DPM

## 2020-09-02 DIAGNOSIS — J301 Allergic rhinitis due to pollen: Secondary | ICD-10-CM | POA: Diagnosis not present

## 2020-09-03 ENCOUNTER — Other Ambulatory Visit (HOSPITAL_BASED_OUTPATIENT_CLINIC_OR_DEPARTMENT_OTHER): Payer: Self-pay

## 2020-09-16 DIAGNOSIS — J301 Allergic rhinitis due to pollen: Secondary | ICD-10-CM | POA: Diagnosis not present

## 2020-09-23 ENCOUNTER — Ambulatory Visit (INDEPENDENT_AMBULATORY_CARE_PROVIDER_SITE_OTHER): Payer: 59 | Admitting: Sports Medicine

## 2020-09-23 ENCOUNTER — Other Ambulatory Visit (HOSPITAL_COMMUNITY): Payer: Self-pay

## 2020-09-23 ENCOUNTER — Other Ambulatory Visit: Payer: Self-pay

## 2020-09-23 ENCOUNTER — Encounter: Payer: Self-pay | Admitting: Sports Medicine

## 2020-09-23 DIAGNOSIS — M79671 Pain in right foot: Secondary | ICD-10-CM

## 2020-09-23 DIAGNOSIS — B070 Plantar wart: Secondary | ICD-10-CM

## 2020-09-23 DIAGNOSIS — J301 Allergic rhinitis due to pollen: Secondary | ICD-10-CM | POA: Diagnosis not present

## 2020-09-23 NOTE — Progress Notes (Signed)
Subjective: Joyce Brock is a 55 y.o. female patient returns to office for follow-up evaluation of right foot wart.  Patient reports that she thinks that there is still a small area of wart that still present.  Reports that she has some concerns about recurrence of nail fungus previously was treated with Jublia at the left greater than right 1st toenail.  Patient denies constitutional symptoms at this time.  Patient Active Problem List   Diagnosis Date Noted  . GERD (gastroesophageal reflux disease) 01/16/2020  . Tachycardia   . Vasovagal syncope 01/11/2020  . Pulmonary nodule   . GAD (generalized anxiety disorder) 10/04/2018  . Panic attack 10/04/2018  . Nausea 10/04/2018  . Atopic dermatitis 01/18/2017  . Left upper arm pain 09/25/2014  . Verruca vulgaris 11/15/2012  . Rectal pain 03/29/2012  . Palpitations 12/26/2011  . HYPERCHOLESTEROLEMIA 02/05/2009  . ALLERGIC RHINITIS 10/30/2008  . PSORIASIS 10/30/2008  . URINARY INCONTINENCE, MIXED 10/30/2008    Current Outpatient Medications on File Prior to Visit  Medication Sig Dispense Refill  . acetaminophen (TYLENOL) 500 MG tablet Take 1,000 mg by mouth every 6 (six) hours as needed.    Marland Kitchen atorvastatin (LIPITOR) 10 MG tablet TAKE 1 TABLET BY MOUTH DAILY. 90 tablet 3  . Calcium Carbonate-Vit D-Min (CALCIUM 600+D PLUS MINERALS) 600-400 MG-UNIT TABS Take 2 tablets by mouth daily.    . Calcium Carbonate-Vitamin D 600-400 MG-UNIT tablet Take 2 tablets by mouth daily.    . cetirizine (ZYRTEC) 10 MG tablet Take 10 mg by mouth daily.     . cetirizine (ZYRTEC) 10 MG tablet TAKE 1 TABLET BY MOUTH DAILY AS NEEDED 90 tablet 12  . cimetidine (TAGAMET) 200 MG tablet TAKE 1 TABLET BY MOUTH 2 TIMES DAILY FOR 14 DAYS 28 tablet 0  . EPINEPHrine 0.3 mg/0.3 mL IJ SOAJ injection Inject into the muscle.    . fluticasone (FLONASE) 50 MCG/ACT nasal spray Place 1 spray into both nostrils daily.     . fluticasone (FLONASE) 50 MCG/ACT nasal spray SPRAY TWO  SPRAYS IN EACH NOSTRIL ONCE DAILY 48 g 12  . hydrOXYzine (ATARAX/VISTARIL) 50 MG tablet Take 1 tablet (50 mg total) by mouth 3 (three) times daily as needed for anxiety or nausea. 30 tablet 0  . pantoprazole (PROTONIX) 40 MG tablet Take by mouth.    . vitamin B-12 (CYANOCOBALAMIN) 1000 MCG tablet Take 1,000 mcg by mouth daily.     No current facility-administered medications on file prior to visit.    No Known Allergies  Objective:  General: Alert and oriented x3 in no acute distress  Dermatology: Minimal keratotic lesion present measuring less than 0.1 cm at ball of the right foot x1 with no skin lines transversing the lesion, slowly improving, +pink epithelized skin with good reaction noted to previous medication application, no webspace macerations, no ecchymosis bilateral, all nails x 10 are well manicured distal discoloration noted to the left greater than right first toenail.  Vascular: Dorsalis Pedis and Posterior Tibial pedal pulses 2/4, Capillary Fill Time 3 seconds, + pedal hair growth bilateral, no edema bilateral lower extremities, Temperature gradient within normal limits.  Neurology: Johney Maine sensation intact via light touch bilateral.  Musculoskeletal: Decreased tenderness with palpation at the lesion site on Right forefoot, muscular strength 5/5 in all groups without pain or limitation on range of motion. No other lower extremity muscular or boney deformity noted.  Assessment and Plan: Problem List Items Addressed This Visit   None   Visit Diagnoses  Plantar wart    -  Primary   Right foot pain          -Complete examination performed -Re-Discussed treatment options for wart -To ball of right foot treated the warts x 1 with Catharidin covered with bandaid; Advised patient of blistering reaction that will occur from application of medication and once this happens replace bandaid with neosporin and tape/bandaid.  This is treatment #4 to the area -Encouraged again good  hygiene habits to prevent reinfection or worsening of wart -Patient to return to office as scheduled in 4-6 weeks for wart follow-up and advised patient that after this visit we can further discuss laser treatment for nail fungus.  Landis Martins, DPM

## 2020-09-30 DIAGNOSIS — J301 Allergic rhinitis due to pollen: Secondary | ICD-10-CM | POA: Diagnosis not present

## 2020-10-01 ENCOUNTER — Other Ambulatory Visit: Payer: Self-pay | Admitting: Unknown Physician Specialty

## 2020-10-05 ENCOUNTER — Other Ambulatory Visit (HOSPITAL_COMMUNITY): Payer: Self-pay

## 2020-10-05 DIAGNOSIS — J301 Allergic rhinitis due to pollen: Secondary | ICD-10-CM | POA: Diagnosis not present

## 2020-10-07 ENCOUNTER — Other Ambulatory Visit (HOSPITAL_COMMUNITY): Payer: Self-pay

## 2020-10-07 ENCOUNTER — Other Ambulatory Visit: Payer: Self-pay | Admitting: Unknown Physician Specialty

## 2020-10-07 DIAGNOSIS — J301 Allergic rhinitis due to pollen: Secondary | ICD-10-CM | POA: Diagnosis not present

## 2020-10-09 ENCOUNTER — Other Ambulatory Visit: Payer: Self-pay | Admitting: Unknown Physician Specialty

## 2020-10-09 ENCOUNTER — Other Ambulatory Visit (HOSPITAL_COMMUNITY): Payer: Self-pay

## 2020-10-12 ENCOUNTER — Other Ambulatory Visit (HOSPITAL_COMMUNITY): Payer: Self-pay

## 2020-10-12 ENCOUNTER — Other Ambulatory Visit: Payer: Self-pay | Admitting: Unknown Physician Specialty

## 2020-10-14 ENCOUNTER — Other Ambulatory Visit (HOSPITAL_COMMUNITY): Payer: Self-pay

## 2020-10-14 DIAGNOSIS — J301 Allergic rhinitis due to pollen: Secondary | ICD-10-CM | POA: Diagnosis not present

## 2020-10-14 MED ORDER — CETIRIZINE HCL 10 MG PO TABS
ORAL_TABLET | ORAL | 14 refills | Status: DC
  Filled 2020-10-14: qty 60, 60d supply, fill #0
  Filled 2020-12-14: qty 100, 100d supply, fill #1
  Filled 2021-07-06: qty 100, 100d supply, fill #2

## 2020-10-18 ENCOUNTER — Other Ambulatory Visit (HOSPITAL_COMMUNITY): Payer: Self-pay

## 2020-10-21 DIAGNOSIS — J301 Allergic rhinitis due to pollen: Secondary | ICD-10-CM | POA: Diagnosis not present

## 2020-10-28 ENCOUNTER — Encounter: Payer: Self-pay | Admitting: Sports Medicine

## 2020-10-28 ENCOUNTER — Ambulatory Visit (INDEPENDENT_AMBULATORY_CARE_PROVIDER_SITE_OTHER): Payer: 59 | Admitting: Sports Medicine

## 2020-10-28 ENCOUNTER — Other Ambulatory Visit: Payer: Self-pay

## 2020-10-28 DIAGNOSIS — B351 Tinea unguium: Secondary | ICD-10-CM

## 2020-10-28 DIAGNOSIS — B070 Plantar wart: Secondary | ICD-10-CM | POA: Diagnosis not present

## 2020-10-28 DIAGNOSIS — J301 Allergic rhinitis due to pollen: Secondary | ICD-10-CM | POA: Diagnosis not present

## 2020-10-28 DIAGNOSIS — M79671 Pain in right foot: Secondary | ICD-10-CM

## 2020-10-28 NOTE — Progress Notes (Signed)
Subjective: Joyce Brock is a 55 y.o. female patient returns to office for follow-up evaluation of right foot wart.  Patient reports wart is doing good skin just peeled off. Patient is also here for f/u evaluation of nails. Denies any other changes or pedal complaints at this time.   Patient Active Problem List   Diagnosis Date Noted  . GERD (gastroesophageal reflux disease) 01/16/2020  . Tachycardia   . Vasovagal syncope 01/11/2020  . Pulmonary nodule   . GAD (generalized anxiety disorder) 10/04/2018  . Panic attack 10/04/2018  . Nausea 10/04/2018  . Atopic dermatitis 01/18/2017  . Left upper arm pain 09/25/2014  . Verruca vulgaris 11/15/2012  . Rectal pain 03/29/2012  . Palpitations 12/26/2011  . HYPERCHOLESTEROLEMIA 02/05/2009  . ALLERGIC RHINITIS 10/30/2008  . PSORIASIS 10/30/2008  . URINARY INCONTINENCE, MIXED 10/30/2008    Current Outpatient Medications on File Prior to Visit  Medication Sig Dispense Refill  . acetaminophen (TYLENOL) 500 MG tablet Take 1,000 mg by mouth every 6 (six) hours as needed.    Marland Kitchen atorvastatin (LIPITOR) 10 MG tablet TAKE 1 TABLET BY MOUTH DAILY. 90 tablet 3  . Calcium Carbonate-Vit D-Min (CALCIUM 600+D PLUS MINERALS) 600-400 MG-UNIT TABS Take 2 tablets by mouth daily.    . Calcium Carbonate-Vitamin D 600-400 MG-UNIT tablet Take 2 tablets by mouth daily.    . cetirizine (ZYRTEC) 10 MG tablet Take 10 mg by mouth daily.     . cetirizine (ZYRTEC) 10 MG tablet TAKE 1 TABLET BY MOUTH DAILY AS NEEDED 90 tablet 12  . cetirizine (ZYRTEC) 10 MG tablet Take 1 tablet by mouth once daily as needed 60 tablet 14  . cimetidine (TAGAMET) 200 MG tablet TAKE 1 TABLET BY MOUTH 2 TIMES DAILY FOR 14 DAYS 28 tablet 0  . EPINEPHrine 0.3 mg/0.3 mL IJ SOAJ injection Inject into the muscle.    . fluticasone (FLONASE) 50 MCG/ACT nasal spray Place 1 spray into both nostrils daily.     . fluticasone (FLONASE) 50 MCG/ACT nasal spray SPRAY TWO SPRAYS IN EACH NOSTRIL ONCE DAILY  48 g 12  . hydrOXYzine (ATARAX/VISTARIL) 50 MG tablet Take 1 tablet (50 mg total) by mouth 3 (three) times daily as needed for anxiety or nausea. 30 tablet 0  . pantoprazole (PROTONIX) 40 MG tablet Take by mouth.    . vitamin B-12 (CYANOCOBALAMIN) 1000 MCG tablet Take 1,000 mcg by mouth daily.     No current facility-administered medications on file prior to visit.    No Known Allergies  Objective:  General: Alert and oriented x3 in no acute distress  Dermatology: Minimal keratotic lesion present at ball of the right foot x1 with no skin lines transversing the lesion, wart appears resolved +pink epithelized skin with good reaction noted to previous medication application, no webspace macerations, no ecchymosis bilateral, all nails x 10 are well manicured distal discoloration noted to the left greater than right first toenail.  Vascular: Dorsalis Pedis and Posterior Tibial pedal pulses 2/4, Capillary Fill Time 3 seconds, + pedal hair growth bilateral, no edema bilateral lower extremities, Temperature gradient within normal limits.  Neurology: Johney Maine sensation intact via light touch bilateral.  Musculoskeletal: Decreased tenderness with palpation at the lesion site on Right forefoot, muscular strength 5/5 in all groups without pain or limitation on range of motion. No other lower extremity muscular or boney deformity noted.  Assessment and Plan: Problem List Items Addressed This Visit   None   Visit Diagnoses    Plantar wart    -  Primary   Right foot pain       Nail fungus          -Complete examination performed -Re-Discussed continued care for wart -To ball of right foot treated the warts x 1 with Salinocaine covered with bandaid; Advised patient of peeling reaction that may occur -Encouraged again good hygiene habits to prevent reinfection or worsening of wart -Patient to return to office as scheduled in 4-6 weeks for wart follow-up and advised patient that now that her wart is  better she may start laser treatment for nail fungus since she has failed [place] in the past.   Landis Martins, DPM

## 2020-11-11 DIAGNOSIS — J301 Allergic rhinitis due to pollen: Secondary | ICD-10-CM | POA: Diagnosis not present

## 2020-11-18 DIAGNOSIS — J301 Allergic rhinitis due to pollen: Secondary | ICD-10-CM | POA: Diagnosis not present

## 2020-12-02 DIAGNOSIS — J301 Allergic rhinitis due to pollen: Secondary | ICD-10-CM | POA: Diagnosis not present

## 2020-12-09 DIAGNOSIS — J301 Allergic rhinitis due to pollen: Secondary | ICD-10-CM | POA: Diagnosis not present

## 2020-12-10 ENCOUNTER — Other Ambulatory Visit: Payer: Self-pay

## 2020-12-10 ENCOUNTER — Ambulatory Visit (INDEPENDENT_AMBULATORY_CARE_PROVIDER_SITE_OTHER): Payer: Self-pay | Admitting: *Deleted

## 2020-12-10 DIAGNOSIS — B351 Tinea unguium: Secondary | ICD-10-CM

## 2020-12-10 NOTE — Progress Notes (Signed)
Patient presents today for the 1st laser treatment. Diagnosed with mycotic nail infection by Dr. Cannon Kettle.   Toenail most affected hallux bilateral .  All other systems are negative.  Nails were filed thin. Laser therapy was administered to 1st toenails bilateral and patient tolerated the treatment well. All safety precautions were in place.    Follow up in 4 weeks for laser # 2.

## 2020-12-15 ENCOUNTER — Other Ambulatory Visit (HOSPITAL_COMMUNITY): Payer: Self-pay

## 2020-12-23 DIAGNOSIS — J301 Allergic rhinitis due to pollen: Secondary | ICD-10-CM | POA: Diagnosis not present

## 2020-12-30 ENCOUNTER — Other Ambulatory Visit (HOSPITAL_COMMUNITY): Payer: Self-pay

## 2020-12-30 ENCOUNTER — Other Ambulatory Visit: Payer: Self-pay

## 2020-12-30 DIAGNOSIS — J301 Allergic rhinitis due to pollen: Secondary | ICD-10-CM | POA: Diagnosis not present

## 2020-12-30 MED ORDER — FLUTICASONE PROPIONATE 50 MCG/ACT NA SUSP
NASAL | 12 refills | Status: DC
  Filled 2020-12-30: qty 16, 30d supply, fill #0

## 2020-12-30 MED ORDER — CARESTART COVID-19 HOME TEST VI KIT
PACK | 0 refills | Status: DC
  Filled 2020-12-30: qty 4, 4d supply, fill #0

## 2020-12-30 MED FILL — Atorvastatin Calcium Tab 10 MG (Base Equivalent): ORAL | 90 days supply | Qty: 90 | Fill #0 | Status: AC

## 2020-12-31 ENCOUNTER — Other Ambulatory Visit (HOSPITAL_COMMUNITY): Payer: Self-pay

## 2020-12-31 DIAGNOSIS — J301 Allergic rhinitis due to pollen: Secondary | ICD-10-CM | POA: Diagnosis not present

## 2021-01-06 DIAGNOSIS — J301 Allergic rhinitis due to pollen: Secondary | ICD-10-CM | POA: Diagnosis not present

## 2021-01-07 ENCOUNTER — Ambulatory Visit (INDEPENDENT_AMBULATORY_CARE_PROVIDER_SITE_OTHER): Payer: 59 | Admitting: *Deleted

## 2021-01-07 ENCOUNTER — Other Ambulatory Visit: Payer: Self-pay

## 2021-01-07 DIAGNOSIS — B351 Tinea unguium: Secondary | ICD-10-CM

## 2021-01-07 NOTE — Progress Notes (Signed)
Patient presents today for the 2nd laser treatment. Diagnosed with mycotic nail infection by Dr. Cannon Kettle.   Toenail most affected hallux bilateral .  All other systems are negative.  Nails were filed thin. Laser therapy was administered to 1st toenails bilateral and patient tolerated the treatment well. All safety precautions were in place.    Follow up in 4 weeks for laser # 3.

## 2021-01-13 DIAGNOSIS — J301 Allergic rhinitis due to pollen: Secondary | ICD-10-CM | POA: Diagnosis not present

## 2021-01-20 DIAGNOSIS — J301 Allergic rhinitis due to pollen: Secondary | ICD-10-CM | POA: Diagnosis not present

## 2021-02-03 DIAGNOSIS — J301 Allergic rhinitis due to pollen: Secondary | ICD-10-CM | POA: Diagnosis not present

## 2021-02-04 ENCOUNTER — Other Ambulatory Visit: Payer: Self-pay

## 2021-02-04 ENCOUNTER — Ambulatory Visit (INDEPENDENT_AMBULATORY_CARE_PROVIDER_SITE_OTHER): Payer: 59 | Admitting: *Deleted

## 2021-02-04 DIAGNOSIS — B351 Tinea unguium: Secondary | ICD-10-CM

## 2021-02-04 NOTE — Progress Notes (Signed)
Patient presents today for the 3rd laser treatment. Diagnosed with mycotic nail infection by Dr. Cannon Kettle.   Toenail most affected hallux bilateral. The nails have significantly improved.   All other systems are negative.  Nails were filed thin. Laser therapy was administered to 1st toenails bilateral and patient tolerated the treatment well. All safety precautions were in place.    Follow up in 6 weeks for laser # 4.   She may only need one more treatment.

## 2021-02-10 DIAGNOSIS — J301 Allergic rhinitis due to pollen: Secondary | ICD-10-CM | POA: Diagnosis not present

## 2021-02-11 ENCOUNTER — Telehealth: Payer: Self-pay | Admitting: Family Medicine

## 2021-02-11 DIAGNOSIS — E7800 Pure hypercholesterolemia, unspecified: Secondary | ICD-10-CM

## 2021-02-11 NOTE — Telephone Encounter (Signed)
-----   Message from [name] sent at 01/31/2021 10:39 AM EDT ----- Regarding: Lab orders for Wednesday, 9.7.22 Patient is scheduled for CPX labs, please order future labs, Thanks , [name]

## 2021-02-16 ENCOUNTER — Other Ambulatory Visit: Payer: Self-pay

## 2021-02-16 ENCOUNTER — Other Ambulatory Visit (INDEPENDENT_AMBULATORY_CARE_PROVIDER_SITE_OTHER): Payer: 59

## 2021-02-16 DIAGNOSIS — E7800 Pure hypercholesterolemia, unspecified: Secondary | ICD-10-CM

## 2021-02-16 LAB — COMPREHENSIVE METABOLIC PANEL
ALT: 15 U/L (ref 0–35)
AST: 14 U/L (ref 0–37)
Albumin: 4.1 g/dL (ref 3.5–5.2)
Alkaline Phosphatase: 74 U/L (ref 39–117)
BUN: 12 mg/dL (ref 6–23)
CO2: 30 mEq/L (ref 19–32)
Calcium: 9.3 mg/dL (ref 8.4–10.5)
Chloride: 103 mEq/L (ref 96–112)
Creatinine, Ser: 0.77 mg/dL (ref 0.40–1.20)
GFR: 86.45 mL/min (ref >60.00)
Glucose, Bld: 94 mg/dL (ref 70–99)
Potassium: 4.4 mEq/L (ref 3.5–5.1)
Sodium: 140 mEq/L (ref 135–145)
Total Bilirubin: 0.5 mg/dL (ref 0.2–1.2)
Total Protein: 6.9 g/dL (ref 6.0–8.3)

## 2021-02-16 LAB — LIPID PANEL
Cholesterol: 201 mg/dL — ABNORMAL HIGH (ref 0–200)
HDL: 52.7 mg/dL (ref >39.00)
LDL Cholesterol: 123 mg/dL — ABNORMAL HIGH (ref 0–99)
NonHDL: 148.4
Total CHOL/HDL Ratio: 4
Triglycerides: 128 mg/dL (ref 0.0–149.0)
VLDL: 25.6 mg/dL (ref 0.0–40.0)

## 2021-02-16 NOTE — Progress Notes (Signed)
No critical labs need to be addressed urgently. We will discuss labs in detail at upcoming office visit.   

## 2021-02-17 ENCOUNTER — Encounter: Payer: Self-pay | Admitting: Sports Medicine

## 2021-02-17 DIAGNOSIS — J301 Allergic rhinitis due to pollen: Secondary | ICD-10-CM | POA: Diagnosis not present

## 2021-02-18 ENCOUNTER — Encounter: Payer: 59 | Admitting: Family Medicine

## 2021-02-24 DIAGNOSIS — J301 Allergic rhinitis due to pollen: Secondary | ICD-10-CM | POA: Diagnosis not present

## 2021-03-10 ENCOUNTER — Other Ambulatory Visit: Payer: Self-pay

## 2021-03-10 ENCOUNTER — Ambulatory Visit (INDEPENDENT_AMBULATORY_CARE_PROVIDER_SITE_OTHER): Payer: 59 | Admitting: Family Medicine

## 2021-03-10 ENCOUNTER — Other Ambulatory Visit (HOSPITAL_COMMUNITY): Payer: Self-pay

## 2021-03-10 VITALS — BP 118/78 | HR 74 | Temp 97.4°F | Ht 66.25 in | Wt 154.0 lb

## 2021-03-10 DIAGNOSIS — K219 Gastro-esophageal reflux disease without esophagitis: Secondary | ICD-10-CM | POA: Diagnosis not present

## 2021-03-10 DIAGNOSIS — J301 Allergic rhinitis due to pollen: Secondary | ICD-10-CM | POA: Diagnosis not present

## 2021-03-10 DIAGNOSIS — E7800 Pure hypercholesterolemia, unspecified: Secondary | ICD-10-CM

## 2021-03-10 DIAGNOSIS — R911 Solitary pulmonary nodule: Secondary | ICD-10-CM | POA: Diagnosis not present

## 2021-03-10 DIAGNOSIS — F411 Generalized anxiety disorder: Secondary | ICD-10-CM

## 2021-03-10 DIAGNOSIS — Z0000 Encounter for general adult medical examination without abnormal findings: Secondary | ICD-10-CM

## 2021-03-10 MED ORDER — PANTOPRAZOLE SODIUM 40 MG PO TBEC
40.0000 mg | DELAYED_RELEASE_TABLET | ORAL | 1 refills | Status: DC | PRN
  Filled 2021-03-10: qty 30, 30d supply, fill #0
  Filled 2021-04-22: qty 30, 30d supply, fill #1

## 2021-03-10 NOTE — Assessment & Plan Note (Signed)
History of remote smoking 10 year pack history  No family history of lung cancer or work toxin exposure.  Second hand smoke exposure as a child.  Re-eval for stability with CT low dose... if stable no further imaging required given < 6 mm

## 2021-03-10 NOTE — Assessment & Plan Note (Addendum)
Chronic intermittent, worsened    Restart Pantoprazole 40 mg daily for 4-6 weeks, then wean off.

## 2021-03-10 NOTE — Assessment & Plan Note (Signed)
Stable, chronic.  Continue current medication. ? ? ?Atorvastatin 20 mg daily ?

## 2021-03-10 NOTE — Progress Notes (Signed)
Patient ID: Joyce Brock, female    DOB: 15-Jan-1966, 55 y.o.   MRN: [id number]  This visit was conducted in person.  BP 118/78   Pulse 74   Temp (!) 97.4 F (36.3 C) (Temporal)   Ht 5' 6.25" (1.683 m)   Wt 154 lb (69.9 kg)   LMP 03/23/2013   SpO2 99%   BMI 24.67 kg/m    CC: Chief Complaint  Patient presents with   Annual Exam    Will get flu shot at work     Subjective:   HPI: Joyce Brock is a 55 y.o. female presenting on 03/10/2021 for Annual Exam (Will get flu shot at work ) Elevated Cholesterol:  LDL  at goal < 130 on atorvastatin 10 mg  every other day Lab Results  Component Value Date   CHOL 201 (H) 02/16/2021   HDL 52.70 02/16/2021   LDLCALC 123 (H) 02/16/2021   LDLDIRECT 168.7 03/22/2012   TRIG 128.0 02/16/2021   CHOLHDL 4 02/16/2021  The 10-year ASCVD risk score (Arnett DK, et al., 2019) is: 2%   Values used to calculate the score:     Age: 52 years     Sex: Female     Is Non-Hispanic African American: No     Diabetic: No     Tobacco smoker: No     Systolic Blood Pressure: 751 mmHg     Is BP treated: No     HDL Cholesterol: 52.7 mg/dL     Total Cholesterol: 201 mg/dL Using medications without problems: Muscle aches:  yes on daily dose of medication. Diet compliance: moderate.. has not been eating as well this year Exercise: none Other complaints:   GAD:  moderate control mood given increased stress. [facility] Visit from 02/13/2020 in [place] at [facility] Total Score 0       GERD  has had some increase in this with stress and not eating right.Using OTC PPI for 14 day a at a time.     Occ palpitations.. but has  been under increase stress. Evaluated in past.    Relevant past medical, surgical, family and social history reviewed and updated as indicated. Interim medical history since our last visit reviewed. Allergies and medications reviewed and updated. Outpatient Medications Prior to Visit   Medication Sig Dispense Refill   acetaminophen (TYLENOL) 500 MG tablet Take 1,000 mg by mouth every 6 (six) hours as needed.     atorvastatin (LIPITOR) 10 MG tablet TAKE 1 TABLET BY MOUTH DAILY. 90 tablet 3   Calcium Carbonate-Vit D-Min (CALCIUM 600+D PLUS MINERALS) 600-400 MG-UNIT TABS Take 2 tablets by mouth daily.     cetirizine (ZYRTEC) 10 MG tablet Take 1 tablet by mouth once daily as needed (Patient taking differently: Take 10 mg by mouth daily.) 60 tablet 14   EPINEPHrine 0.3 mg/0.3 mL IJ SOAJ injection Inject into the muscle.     fluticasone (FLONASE) 50 MCG/ACT nasal spray SPRAY TWO SPRAYS IN EACH NOSTRIL ONCE DAILY 48 g 12   hydrOXYzine (ATARAX/VISTARIL) 50 MG tablet Take 1 tablet (50 mg total) by mouth 3 (three) times daily as needed for anxiety or nausea. 30 tablet 0   pantoprazole (PROTONIX) 40 MG tablet Take 40 mg by mouth as needed.     vitamin B-12 (CYANOCOBALAMIN) 1000 MCG tablet Take 1,000 mcg by mouth daily.     Calcium Carbonate-Vitamin D 600-400 MG-UNIT tablet Take 2 tablets by mouth daily.  cetirizine (ZYRTEC) 10 MG tablet Take 10 mg by mouth daily.      cetirizine (ZYRTEC) 10 MG tablet TAKE 1 TABLET BY MOUTH DAILY AS NEEDED 90 tablet 12   cimetidine (TAGAMET) 200 MG tablet TAKE 1 TABLET BY MOUTH 2 TIMES DAILY FOR 14 DAYS 28 tablet 0   COVID-19 At Home Antigen Test (CARESTART COVID-19 HOME TEST) KIT Use as directed 4 each 0   fluticasone (FLONASE) 50 MCG/ACT nasal spray Place 1 spray into both nostrils daily.      fluticasone (FLONASE) 50 MCG/ACT nasal spray Use two sprays in each nostril once daily 16 g 12   [name] 1/20 1-20 MG-MCG tablet      No facility-administered medications prior to visit.     Per HPI unless specifically indicated in ROS section below Review of Systems  Constitutional:  Negative for fatigue and fever.  HENT:  Negative for ear pain.   Eyes:  Negative for pain.  Respiratory:  Negative for chest tightness and shortness of breath.    Cardiovascular:  Negative for chest pain, palpitations and leg swelling.  Gastrointestinal:  Negative for abdominal pain.  Genitourinary:  Negative for dysuria.  Objective:  BP 118/78   Pulse 74   Temp (!) 97.4 F (36.3 C) (Temporal)   Ht 5' 6.25" (1.683 m)   Wt 154 lb (69.9 kg)   LMP 03/23/2013   SpO2 99%   BMI 24.67 kg/m   Wt Readings from Last 3 Encounters:  03/10/21 154 lb (69.9 kg)  04/02/20 161 lb (73 kg)  03/11/20 161 lb (73 kg)      Physical Exam Vitals and nursing note reviewed.  Constitutional:      General: She is not in acute distress.    Appearance: Normal appearance. She is well-developed. She is not ill-appearing or toxic-appearing.  HENT:     Head: Normocephalic.     Right Ear: Hearing, tympanic membrane, ear canal and external ear normal.     Left Ear: Hearing, tympanic membrane, ear canal and external ear normal.     Nose: Nose normal.  Eyes:     General: Lids are normal. Lids are everted, no foreign bodies appreciated.     Conjunctiva/sclera: Conjunctivae normal.     Pupils: Pupils are equal, round, and reactive to light.  Neck:     Thyroid: No thyroid mass or thyromegaly.     Vascular: No carotid bruit.     Trachea: Trachea normal.  Cardiovascular:     Rate and Rhythm: Normal rate and regular rhythm.     Heart sounds: Normal heart sounds, S1 normal and S2 normal. No murmur heard.   No gallop.  Pulmonary:     Effort: Pulmonary effort is normal. No respiratory distress.     Breath sounds: Normal breath sounds. No wheezing, rhonchi or rales.  Abdominal:     General: Bowel sounds are normal. There is no distension or abdominal bruit.     Palpations: Abdomen is soft. There is no fluid wave or mass.     Tenderness: There is no abdominal tenderness. There is no guarding or rebound.     Hernia: No hernia is present.  Musculoskeletal:     Cervical back: Normal range of motion and neck supple.  Lymphadenopathy:     Cervical: No cervical adenopathy.   Skin:    General: Skin is warm and dry.     Findings: No rash.  Neurological:     Mental Status: She is alert.  Cranial Nerves: No cranial nerve deficit.     Sensory: No sensory deficit.  Psychiatric:        Mood and Affect: Mood is not anxious or depressed.        Speech: Speech normal.        Behavior: Behavior normal. Behavior is cooperative.        Judgment: Judgment normal.      Results for orders placed or performed in visit on 02/16/21  Comprehensive metabolic panel  Result Value Ref Range   Sodium 140 135 - 145 mEq/L   Potassium 4.4 3.5 - 5.1 mEq/L   Chloride 103 96 - 112 mEq/L   CO2 30 19 - 32 mEq/L   Glucose, Bld 94 70 - 99 mg/dL   BUN 12 6 - 23 mg/dL   Creatinine, Ser 0.77 0.40 - 1.20 mg/dL   Total Bilirubin 0.5 0.2 - 1.2 mg/dL   Alkaline Phosphatase 74 39 - 117 U/L   AST 14 0 - 37 U/L   ALT 15 0 - 35 U/L   Total Protein 6.9 6.0 - 8.3 g/dL   Albumin 4.1 3.5 - 5.2 g/dL   GFR 86.45 >60.00 mL/min   Calcium 9.3 8.4 - 10.5 mg/dL  Lipid panel  Result Value Ref Range   Cholesterol 201 (H) 0 - 200 mg/dL   Triglycerides 128.0 0.0 - 149.0 mg/dL   HDL 52.70 >39.00 mg/dL   VLDL 25.6 0.0 - 40.0 mg/dL   LDL Cholesterol 123 (H) 0 - 99 mg/dL   Total CHOL/HDL Ratio 4    NonHDL 148.40     This visit occurred during the SARS-CoV-2 public health emergency.  Safety protocols were in place, including screening questions prior to the visit, additional usage of staff PPE, and extensive cleaning of exam room while observing appropriate contact time as indicated for disinfecting solutions.   COVID 19 screen:  No recent travel or known exposure to COVID19 The patient denies respiratory symptoms of COVID 19 at this time. The importance of social distancing was discussed today.   Assessment and Plan The patient's preventative maintenance and recommended screening tests for an annual wellness exam were reviewed in full today. Brought up to date unless services  declined.  Counselled on the importance of diet, exercise, and its role in overall health and mortality. The patient's FH and SH was reviewed, including their home life, tobacco status, and drug and alcohol status.    Vaccines: Uptodate COVID series x 3 ,  Tdap, consider shingrix, will get  flu at work  Mammo: 03/2020 nml, sister with breast cancer. BRCA1 and BRCA 2 neg.  Smoking:no  STD screen/ HIV : refused.  PAP/DVE: 02/2020 normal with neg HPV Colon: colonoscopy 10.2019 repeat in 5 years.  Hep C: consider in future.   Pulmonary nodule.. history of remote smoking 10 year pack history  No family history of lung cancer or work toxin exposure.  Second hand smoke exposure as a child.  Problem List Items Addressed This Visit     GAD (generalized anxiety disorder)    Moderate control mood given increased stress      GERD (gastroesophageal reflux disease)     Chronic intermittent, worsened    Restart Pantoprazole 40 mg daily for 4-6 weeks, then wean off.      Relevant Medications   pantoprazole (PROTONIX) 40 MG tablet   HYPERCHOLESTEROLEMIA    Stable, chronic.  Continue current medication.   Atorvastatin 20 mg daily  Pulmonary nodule    History of remote smoking 10 year pack history  No family history of lung cancer or work toxin exposure.  Second hand smoke exposure as a child.  Re-eval for stability with CT low dose... if stable no further imaging required given < 6 mm      Relevant Orders   CT CHEST NODULE FOLLOW UP LOW DOSE W/O   Other Visit Diagnoses     Routine general medical examination at a health care facility    -  Primary       Eliezer Lofts, MD

## 2021-03-10 NOTE — Patient Instructions (Addendum)
Restart Pantoprazole 40 mg daily for 4-6 weeks, then wean off.  Start regular exercise.  Work on low cholesterol diet.  Can try slightly high dose of atorvastatin if tolerated or use CoQ10 for side effect mitigation.  Look into Shingrix vaccine.  Ex[ect a call re: CT scan poulmonary nodule follow up.  Please call the location of your choice from the menu below to schedule your Mammogram and/or Bone Density appointment.    [facility]                      Phone:  [phone], [place] [address]                                                             Services: Traditional and 3D Mammogram, [place] [facility]ensity                 Phone: [phone] [address], [place] [address]    Service: Bone Density ONLY   *this site does NOT perform mammograms  [facility]                        Phone:  [phone] [address], [place] [address]                                            Services:  3D Mammogram and [place] at [facility]   Phone:  [phone]   [place], [place] [address]                                            Services: 3D Mammogram and [place]  [place] at [facility])  Phone:  [phone]   [address] Room [place], [place] [address]                                              Services:  3D Mammogram and Bone Density

## 2021-03-10 NOTE — Assessment & Plan Note (Addendum)
Moderate control mood given increased stress

## 2021-03-11 NOTE — Addendum Note (Signed)
Addended byEliezer Lofts E on: 03/11/2021 11:03 AM   Modules accepted: Orders

## 2021-03-17 DIAGNOSIS — J301 Allergic rhinitis due to pollen: Secondary | ICD-10-CM | POA: Diagnosis not present

## 2021-03-24 DIAGNOSIS — J301 Allergic rhinitis due to pollen: Secondary | ICD-10-CM | POA: Diagnosis not present

## 2021-03-25 ENCOUNTER — Other Ambulatory Visit: Payer: Self-pay

## 2021-03-25 ENCOUNTER — Ambulatory Visit: Payer: 59

## 2021-03-25 ENCOUNTER — Ambulatory Visit (INDEPENDENT_AMBULATORY_CARE_PROVIDER_SITE_OTHER): Payer: 59 | Admitting: *Deleted

## 2021-03-25 DIAGNOSIS — J301 Allergic rhinitis due to pollen: Secondary | ICD-10-CM | POA: Diagnosis not present

## 2021-03-25 DIAGNOSIS — B351 Tinea unguium: Secondary | ICD-10-CM

## 2021-03-25 MED ORDER — PFIZER COVID-19 VAC BIVALENT 30 MCG/0.3ML IM SUSP
INTRAMUSCULAR | 0 refills | Status: DC
  Filled 2021-03-25: qty 0.3, 1d supply, fill #0

## 2021-03-25 NOTE — Progress Notes (Signed)
Patient presents today for the 4th laser treatment. Diagnosed with mycotic nail infection by Dr. Cannon Kettle.   Toenail most affected hallux bilateral. The nails are clear.   All other systems are negative.  Nails were filed thin. Laser therapy was administered to 1st toenails bilateral and patient tolerated the treatment well. All safety precautions were in place.    Patient has completed the recommended laser treatments. He will follow up with Dr. Cannon Kettle in 2 months to evaluate progress.

## 2021-04-06 ENCOUNTER — Other Ambulatory Visit (HOSPITAL_COMMUNITY): Payer: Self-pay

## 2021-04-06 DIAGNOSIS — K219 Gastro-esophageal reflux disease without esophagitis: Secondary | ICD-10-CM | POA: Diagnosis not present

## 2021-04-06 DIAGNOSIS — J309 Allergic rhinitis, unspecified: Secondary | ICD-10-CM | POA: Diagnosis not present

## 2021-04-06 MED ORDER — CETIRIZINE HCL 10 MG PO TABS
10.0000 mg | ORAL_TABLET | Freq: Every day | ORAL | 14 refills | Status: DC | PRN
  Filled 2021-04-06: qty 90, 90d supply, fill #0

## 2021-04-06 MED ORDER — FLUTICASONE PROPIONATE 50 MCG/ACT NA SUSP
NASAL | 12 refills | Status: DC
  Filled 2021-04-06: qty 16, 30d supply, fill #0

## 2021-04-07 DIAGNOSIS — J301 Allergic rhinitis due to pollen: Secondary | ICD-10-CM | POA: Diagnosis not present

## 2021-04-08 ENCOUNTER — Other Ambulatory Visit: Payer: Self-pay

## 2021-04-08 ENCOUNTER — Ambulatory Visit (INDEPENDENT_AMBULATORY_CARE_PROVIDER_SITE_OTHER)
Admission: RE | Admit: 2021-04-08 | Discharge: 2021-04-08 | Disposition: A | Discharge status: Home / Self Care | Payer: 59 | Source: Ambulatory Visit | Attending: Family Medicine | Admitting: Family Medicine

## 2021-04-08 DIAGNOSIS — R911 Solitary pulmonary nodule: Secondary | ICD-10-CM

## 2021-04-08 IMAGING — CT CT CHEST W/O CM
2 of 4 series · 15 of 36 positions shown, 18 images · non-contrast
Comparison: 01/11/2020

CLINICAL DATA: Lung nodule, < 6mm, low cancer risk. Smoking
history. Needs to assure stability of 4 mm nodule in lung

EXAM:
CT CHEST WITHOUT CONTRAST
TECHNIQUE: Multidetector CT imaging of the chest was performed following the
standard protocol without IV contrast.

[Series 2: thorax · axial · 0.65mm/px · z∈[-283,-41]mm · 12 of 145 slices shown, 15 images]
[im 12/145  mediastinal]
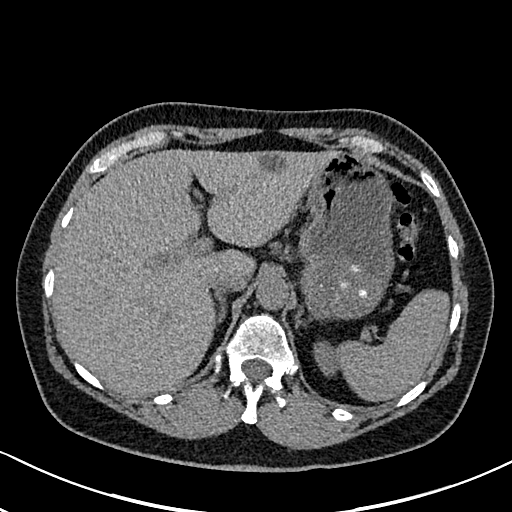
[im 12/145  lung]
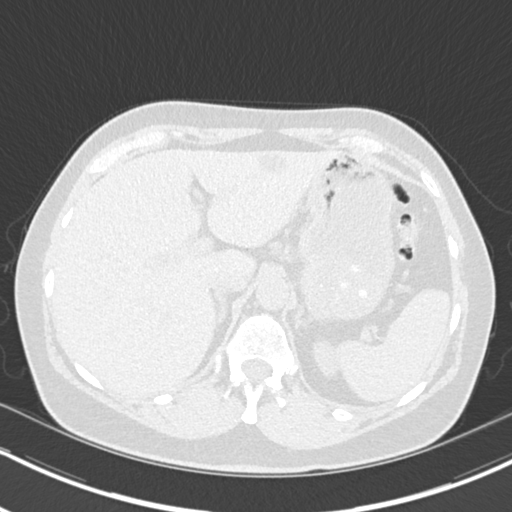
[im 23/145  lung]
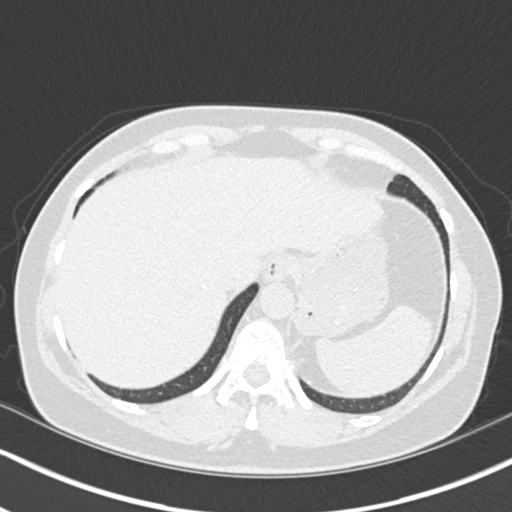
[im 34/145  lung]
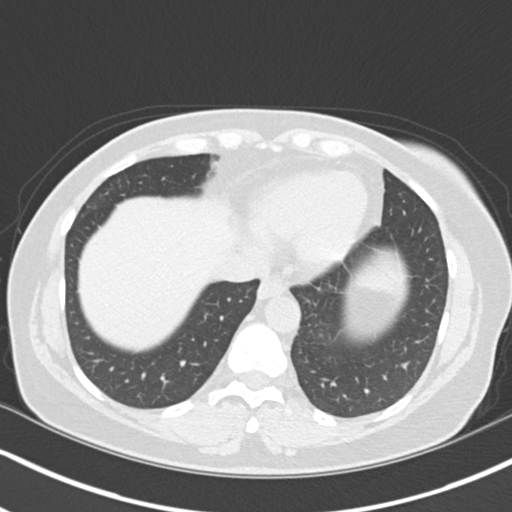
[im 45/145  lung]
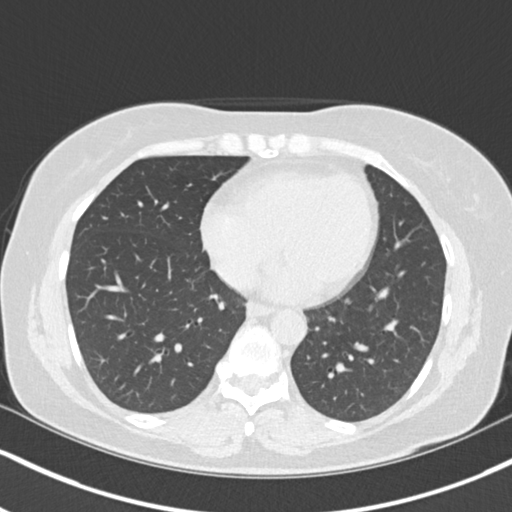
[im 56/145  mediastinal]
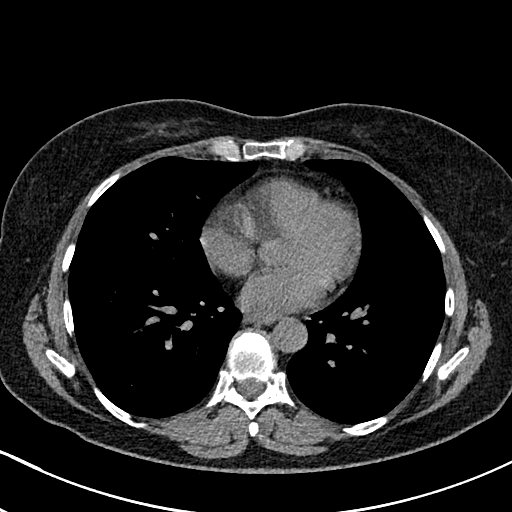
[im 56/145  lung]
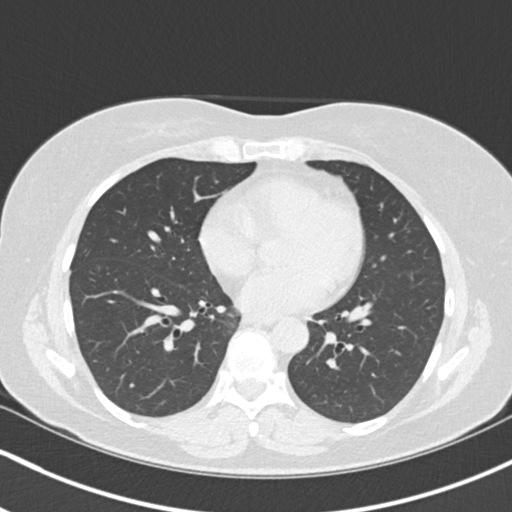
[im 67/145  lung]
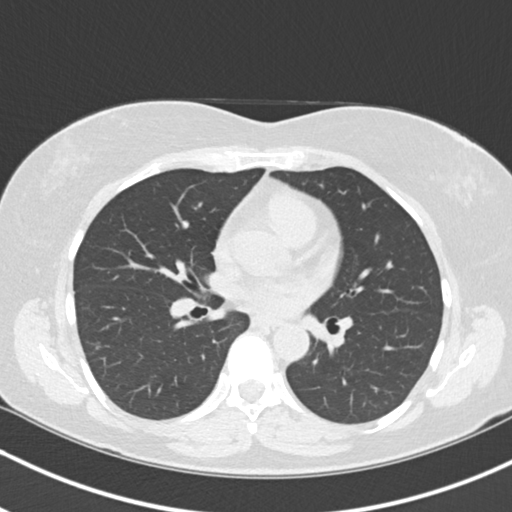
[im 78/145  lung]
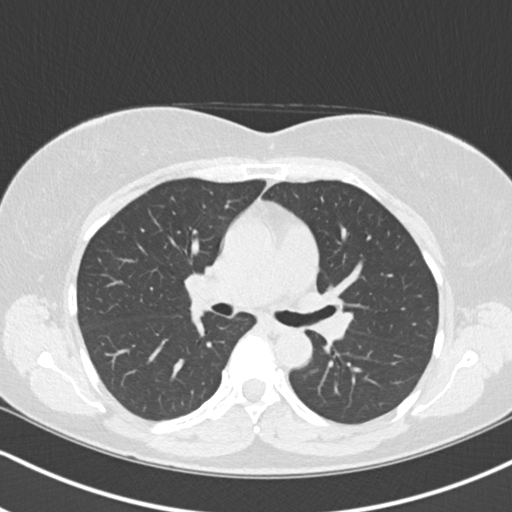
[im 89/145  lung]
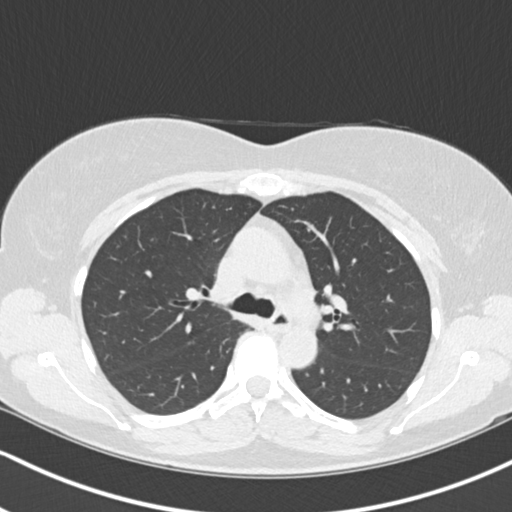
[im 100/145  mediastinal]
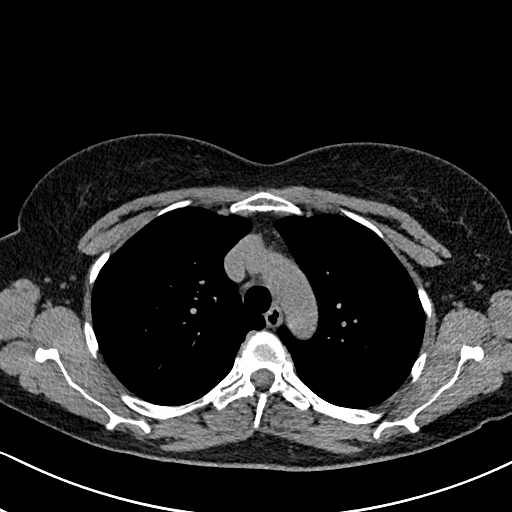
[im 100/145  lung]
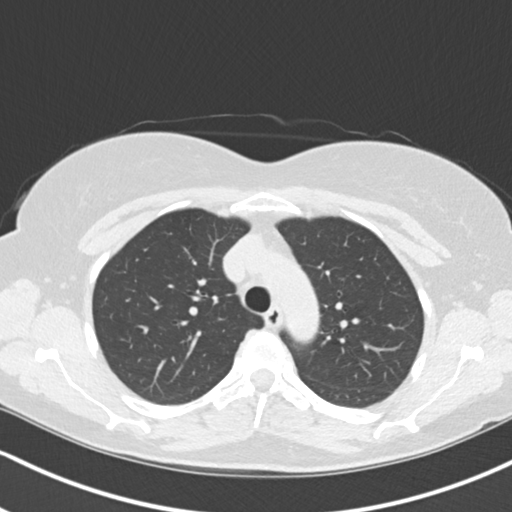
[im 111/145  lung]
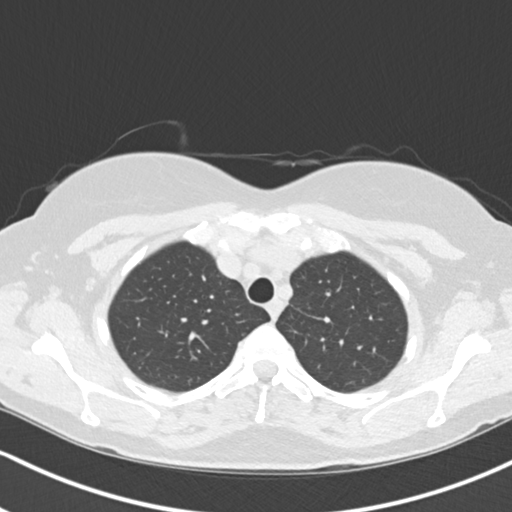
[im 122/145  lung]
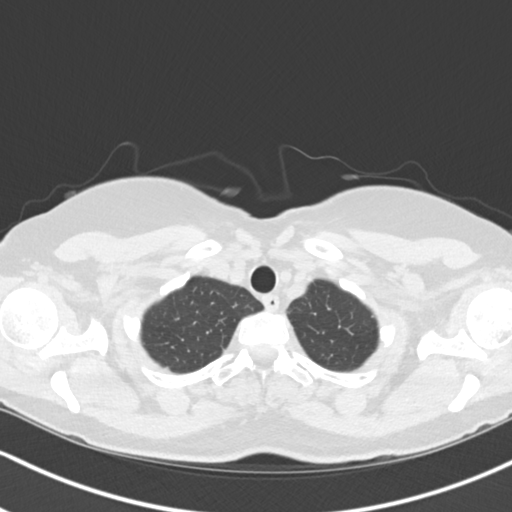
[im 133/145  lung]
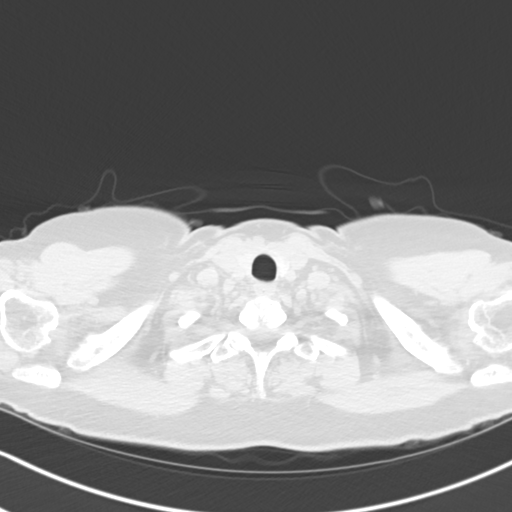

[Series 5: coronal · coronal · 0.59mm/px · 3 of 107 slices shown]
[im 22/107  lung]
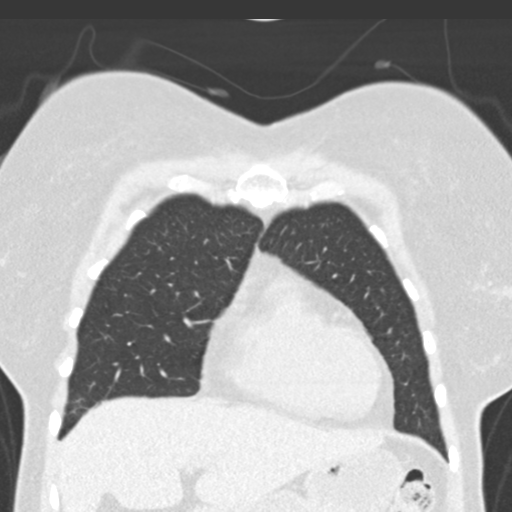
[im 43/107  lung]
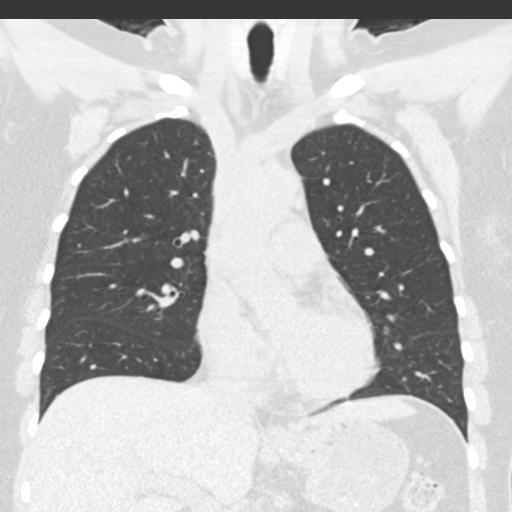
[im 64/107  lung]
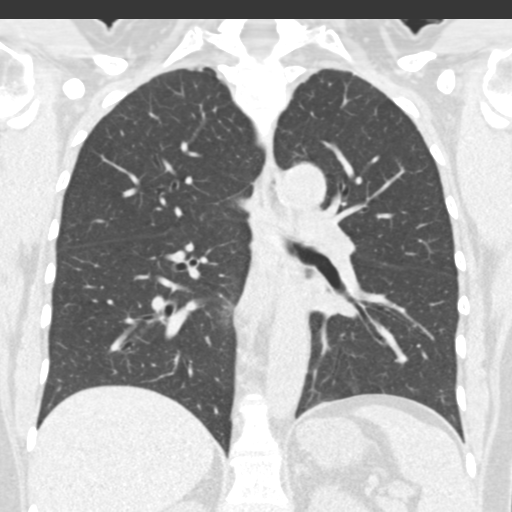

[15 of 36 positions shown; findings below may reference images not displayed]

FINDINGS: Cardiovascular: No significant vascular findings. Normal heart size.
No pericardial effusion.

Mediastinum/Nodes: No enlarged mediastinal or axillary lymph nodes.
Thyroid gland, trachea, and esophagus demonstrate no significant
findings.

Lungs/Pleura: Mean 5 mm noncalcified pulmonary nodule within the
right lower lobe, axial image # 78/3, is stable since prior
examination when measured in similar fashion. This is safely
considered benign given its stability over time. The lungs are
otherwise clear. No pneumothorax or pleural effusion. Central
airways are widely patent.

Upper Abdomen: Multiple cysts noted within the visualized liver.
Cholecystectomy has been performed. No acute abnormality.

Musculoskeletal: No chest wall mass or suspicious bone lesions
identified.
IMPRESSION: Stable right lower lobe pulmonary nodule, safely considered benign.
No suspicious or indeterminate pulmonary nodules identified.

## 2021-04-10 ENCOUNTER — Emergency Department: Payer: 59

## 2021-04-10 ENCOUNTER — Other Ambulatory Visit: Payer: Self-pay

## 2021-04-10 ENCOUNTER — Emergency Department
Admission: EM | Admit: 2021-04-10 | Discharge: 2021-04-10 | Disposition: A | Discharge status: Home / Self Care | Payer: 59 | Attending: Emergency Medicine | Admitting: Emergency Medicine

## 2021-04-10 DIAGNOSIS — Z5321 Procedure and treatment not carried out due to patient leaving prior to being seen by health care provider: Secondary | ICD-10-CM | POA: Diagnosis not present

## 2021-04-10 DIAGNOSIS — R002 Palpitations: Secondary | ICD-10-CM | POA: Diagnosis not present

## 2021-04-10 DIAGNOSIS — R079 Chest pain, unspecified: Secondary | ICD-10-CM | POA: Diagnosis not present

## 2021-04-10 LAB — CBC WITH DIFFERENTIAL/PLATELET
Abs Immature Granulocytes: 0.04 10*3/uL (ref 0.00–0.07)
Basophils Absolute: 0.1 10*3/uL (ref 0.0–0.1)
Basophils Relative: 1 %
Eosinophils Absolute: 0.1 10*3/uL (ref 0.0–0.5)
Eosinophils Relative: 1 %
HCT: 40.8 % (ref 36.0–46.0)
Hemoglobin: 13.9 g/dL (ref 12.0–15.0)
Immature Granulocytes: 0 %
Lymphocytes Relative: 16 %
Lymphs Abs: 1.6 10*3/uL (ref 0.7–4.0)
MCH: 29.6 pg (ref 26.0–34.0)
MCHC: 34.1 g/dL (ref 30.0–36.0)
MCV: 86.8 fL (ref 80.0–100.0)
Monocytes Absolute: 0.5 10*3/uL (ref 0.1–1.0)
Monocytes Relative: 5 %
Neutro Abs: 7.7 10*3/uL (ref 1.7–7.7)
Neutrophils Relative %: 77 %
Platelets: 187 10*3/uL (ref 150–400)
RBC: 4.7 MIL/uL (ref 3.87–5.11)
RDW: 12.5 % (ref 11.5–15.5)
WBC: 9.9 10*3/uL (ref 4.0–10.5)
nRBC: 0 % (ref 0.0–0.2)

## 2021-04-10 LAB — BASIC METABOLIC PANEL
Anion gap: 9 (ref 5–15)
BUN: 14 mg/dL (ref 6–20)
CO2: 26 mmol/L (ref 22–32)
Calcium: 9.3 mg/dL (ref 8.9–10.3)
Chloride: 104 mmol/L (ref 98–111)
Creatinine, Ser: 0.86 mg/dL (ref 0.44–1.00)
GFR, Estimated: >60 mL/min (ref >60)
Glucose, Bld: 130 mg/dL — ABNORMAL HIGH (ref 70–99)
Potassium: 3.7 mmol/L (ref 3.5–5.1)
Sodium: 139 mmol/L (ref 135–145)

## 2021-04-10 LAB — TSH: TSH: 4.207 u[IU]/mL (ref 0.350–4.500)

## 2021-04-10 LAB — TROPONIN I (HIGH SENSITIVITY): Troponin I (High Sensitivity): 3 ng/L (ref <18)

## 2021-04-10 LAB — MAGNESIUM: Magnesium: 2 mg/dL (ref 1.7–2.4)

## 2021-04-10 IMAGING — CR DG CHEST 2V
2 series · 2 of 2 positions shown · non-contrast
Comparison: 01/10/2020

CLINICAL DATA: Chest pain

EXAM:
CHEST - 2 VIEW

[chest pa]
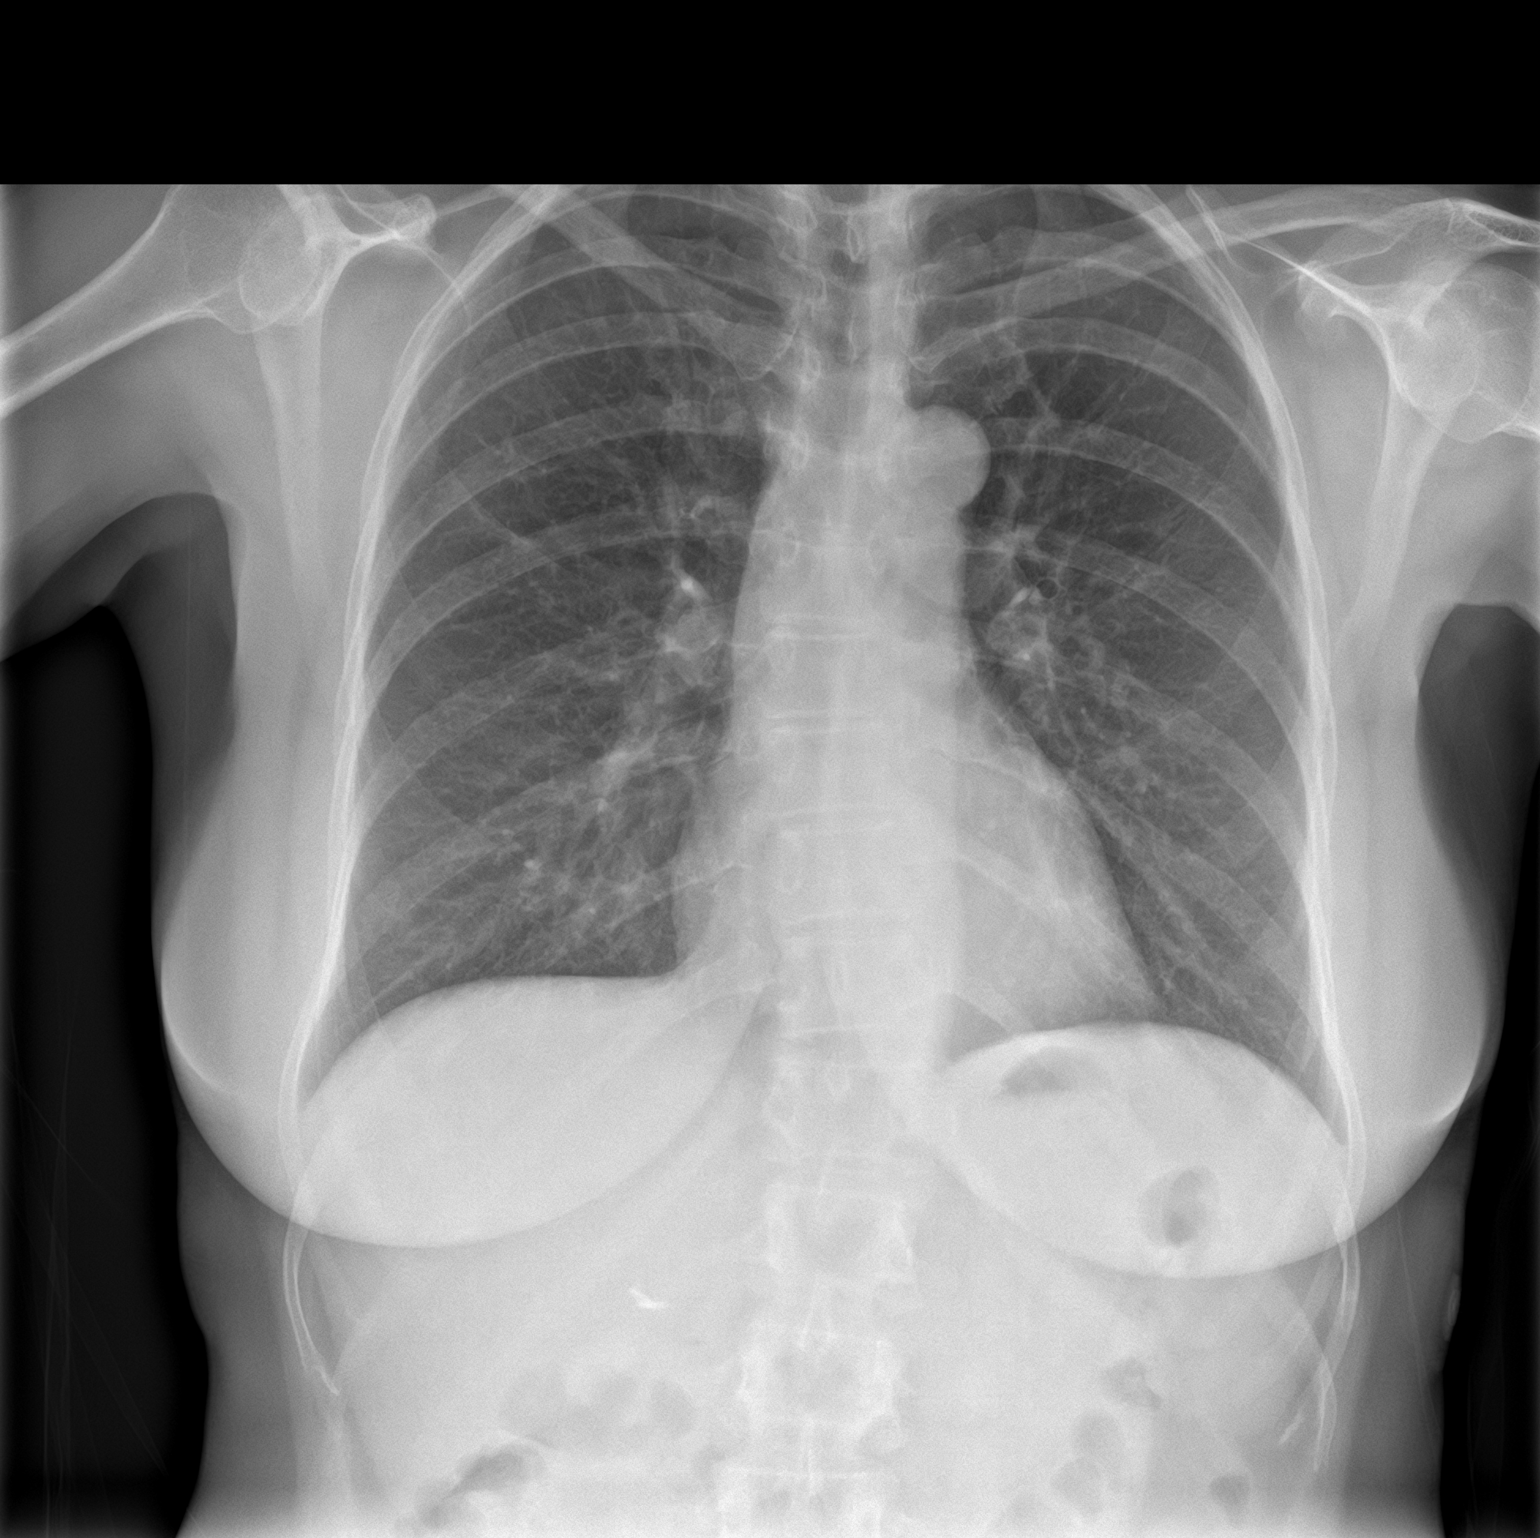

[chest lat]
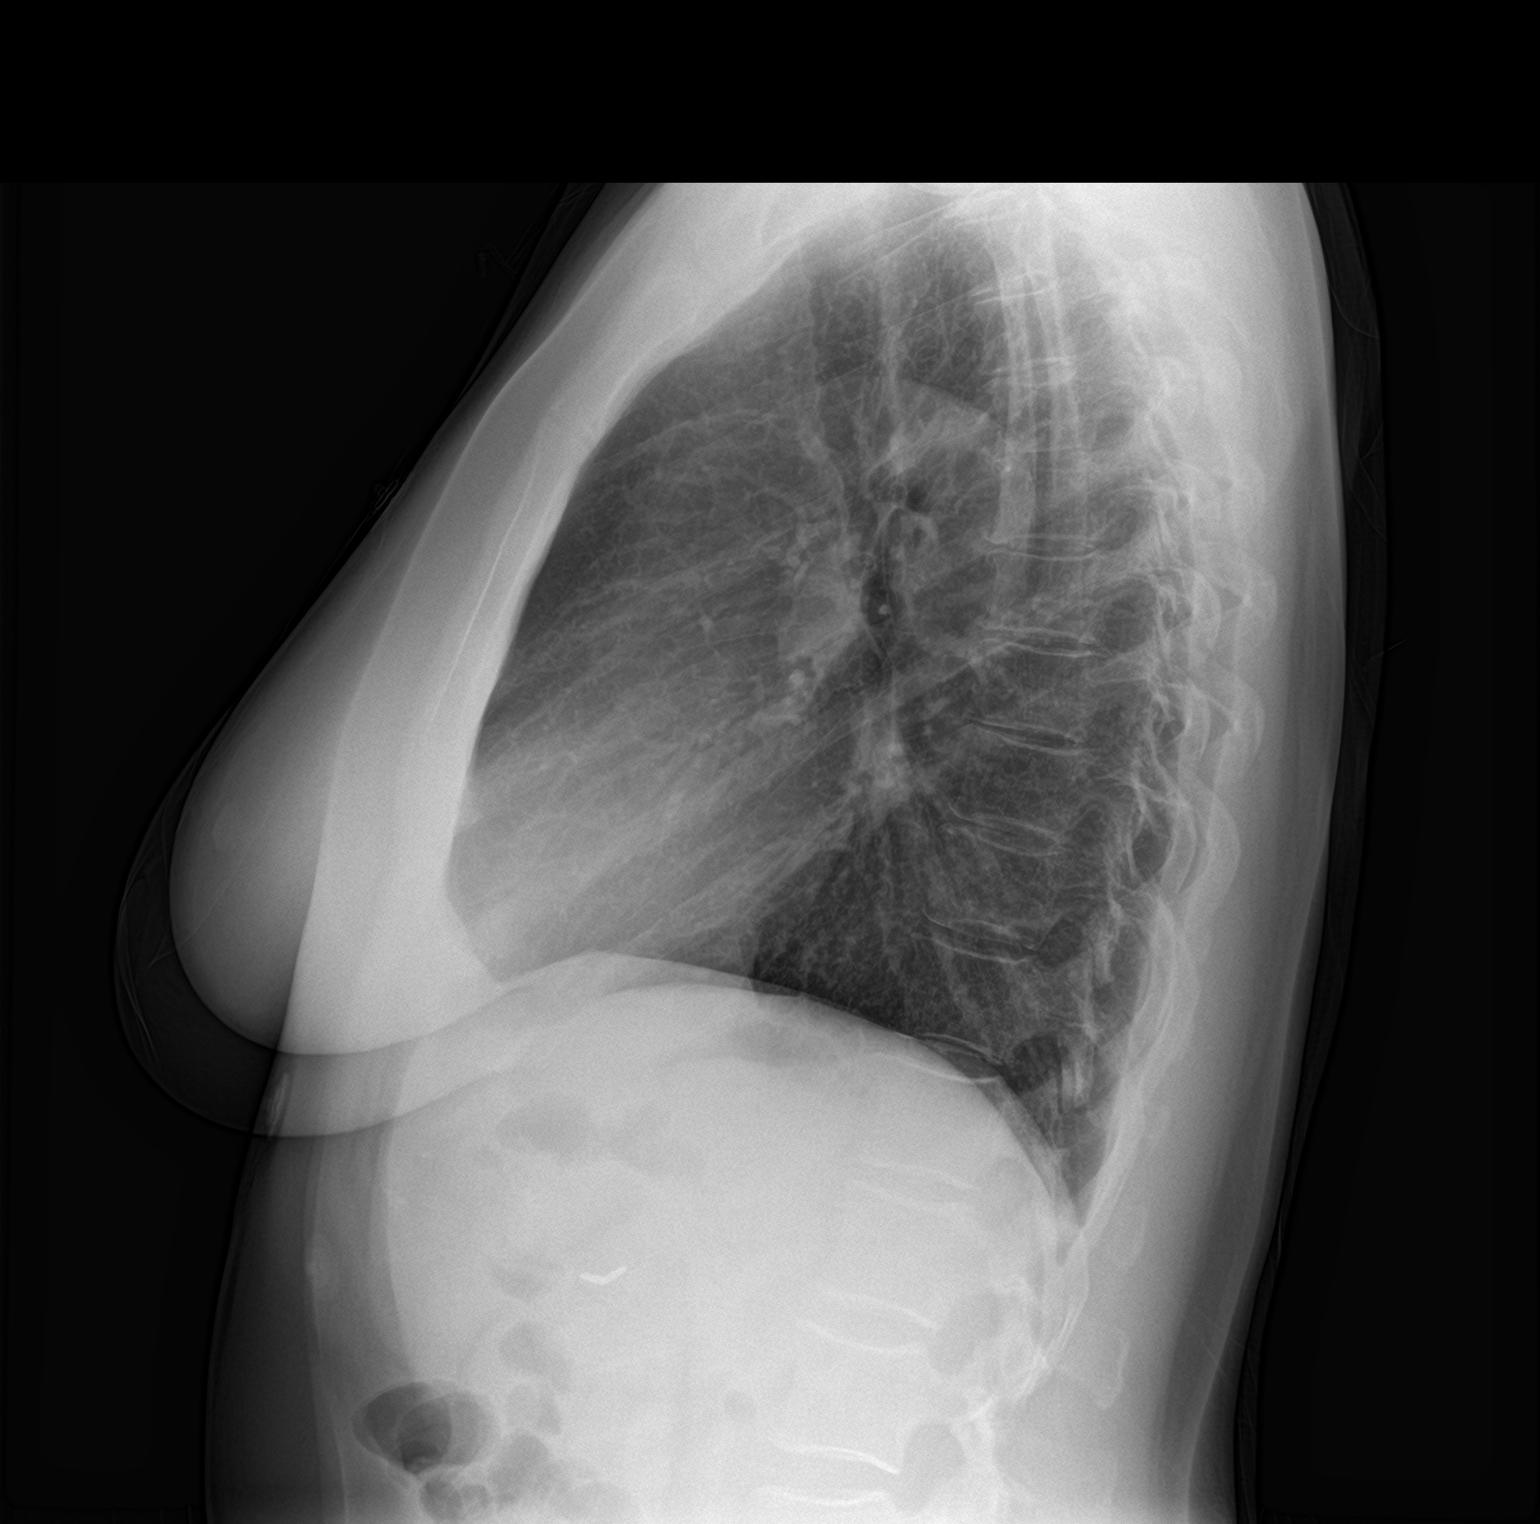

[2 of 2 positions shown; findings below may reference images not displayed]

FINDINGS: The heart size and mediastinal contours are within normal limits.
Both lungs are clear. The visualized skeletal structures are
unremarkable.
IMPRESSION: No active cardiopulmonary disease.

## 2021-04-10 NOTE — ED Triage Notes (Signed)
Pt presents to ER c/o chest pain and palpitations that started around 2130 yesterday. Pt states she was taking a shower and noticed her HR was around 120.  Pt denies hx of cardiac problems.  Pt states she does not have any chest discomfort at this moment.  Pt denies sob, dizziness at this time.

## 2021-04-10 NOTE — ED Provider Notes (Addendum)
Emergency Medicine Provider Triage Evaluation Note  Joyce Brock , a 55 y.o. female  was evaluated in triage.  Pt complains of sharp pains in chest at 9:30 pm while in shower and palpitations.  HR on watch was elevated - highest was 120.  Started having pain in back and neck. CP was also heavy and burning.  No SOB.  Has had nausea.    Has had palpitations before and has worn Holter monitor that was unrevealing per her report.  Cardiologist is [employer].  No history of PE, DVT, exogenous estrogen use, recent fractures, surgery, trauma, hospitalization, prolonged travel or other immobilization. No lower extremity swelling or pain. No calf tenderness.   Review of Systems  Positive: Chest pain, palpitations Negative: SOB, fever, vomiting, diarrhea  Physical Exam  BP (!) 159/108 (BP Location: Left Arm)   Pulse (!) 105   Temp 98.4 F (36.9 C) (Oral)   Resp 20   Wt 72.6 kg   LMP 03/23/2013   SpO2 100%   BMI 25.63 kg/m  Gen:   Awake, no distress   Resp:  Normal effort  MSK:   Moves extremities without difficulty  Other:  RRR  Medical Decision Making  Medically screening exam initiated at 2:03 AM.  Appropriate orders placed.  Joyce Brock was informed that the remainder of the evaluation will be completed by another provider, this initial triage assessment does not replace that evaluation, and the importance of remaining in the ED until their evaluation is complete.   EKG Interpretation  Date/Time:  Sunday April 10 2021 01:55:17 EDT Ventricular Rate:  99 PR Interval:  140 QRS Duration: 76 QT Interval:  356 QTC Calculation: 456 R Axis:   10 Text Interpretation: Normal sinus rhythm Nonspecific ST and T wave abnormality Abnormal ECG No significant change since last tracing Confirmed by Azaylia Fong, Cyril Mourning [phone]) on 04/10/2021 1:57:08 AM          Eliannah Hinde, Delice Bison, DO 04/10/21 0158    Shatina Streets, Delice Bison, DO 04/10/21 0158    Dannah Ryles, Delice Bison, DO 04/10/21 0203

## 2021-04-13 ENCOUNTER — Telehealth: Payer: Self-pay | Admitting: Cardiology

## 2021-04-13 NOTE — Telephone Encounter (Signed)
STAT if HR is under 50 or over 120 (normal HR is 60-100 beats per minute)  What is your heart rate? 109, went to ED over the weekend for chest pain.   Do you have a log of your heart rate readings (document readings)? No log, but ranges from 81-137 (Last night)  Do you have any other symptoms? Last night felt BP was elevated, 140/102 HR 108  Patient has an appt tomorrow with Dr. Garen Lah.

## 2021-04-13 NOTE — Telephone Encounter (Signed)
Called patient and she stated that she just wanted to make sure it was okay for her to wait until tomorrow for her appointment with her recent symptoms. Reviewed her ER visit notes, all testing and lab work was WNL. Patient denies being short of breath, or being unable to complete her daily activities. I looked at the [facility] monitor she had worn last year which resulted in NSR with a few episodes of tachycardia. I informed patient that it was okay to wait until her follow up appointment scheduled for tomorrow to be seen. Patient verbalized understanding and agreed with plan.

## 2021-04-14 ENCOUNTER — Other Ambulatory Visit (HOSPITAL_COMMUNITY): Payer: Self-pay

## 2021-04-14 ENCOUNTER — Encounter: Payer: Self-pay | Admitting: Cardiology

## 2021-04-14 ENCOUNTER — Other Ambulatory Visit: Payer: Self-pay

## 2021-04-14 ENCOUNTER — Ambulatory Visit (INDEPENDENT_AMBULATORY_CARE_PROVIDER_SITE_OTHER): Payer: 59 | Admitting: Cardiology

## 2021-04-14 VITALS — BP 110/80 | HR 87 | Ht 66.0 in | Wt 152.0 lb

## 2021-04-14 DIAGNOSIS — Z1231 Encounter for screening mammogram for malignant neoplasm of breast: Secondary | ICD-10-CM | POA: Diagnosis not present

## 2021-04-14 DIAGNOSIS — R000 Tachycardia, unspecified: Secondary | ICD-10-CM

## 2021-04-14 DIAGNOSIS — R079 Chest pain, unspecified: Secondary | ICD-10-CM | POA: Diagnosis not present

## 2021-04-14 DIAGNOSIS — E7800 Pure hypercholesterolemia, unspecified: Secondary | ICD-10-CM

## 2021-04-14 DIAGNOSIS — J301 Allergic rhinitis due to pollen: Secondary | ICD-10-CM | POA: Diagnosis not present

## 2021-04-14 LAB — HM MAMMOGRAPHY

## 2021-04-14 MED ORDER — METOPROLOL SUCCINATE ER 25 MG PO TB24
25.0000 mg | ORAL_TABLET | Freq: Every day | ORAL | 3 refills | Status: DC
  Filled 2021-04-14: qty 30, 30d supply, fill #0
  Filled 2021-05-13: qty 30, 30d supply, fill #1
  Filled 2021-06-13: qty 30, 30d supply, fill #2
  Filled 2021-07-21: qty 30, 30d supply, fill #3

## 2021-04-14 NOTE — Patient Instructions (Signed)
Medication Instructions:   Your physician has recommended you make the following change in your medication:    START taking Toprol XL 25 MG once a day.  *If you need a refill on your cardiac medications before your next appointment, please call your pharmacy*   Lab Work: None ordered If you have labs (blood work) drawn today and your tests are completely normal, you will receive your results only by: [place] (if you have MyChart) OR A paper copy in the mail If you have any lab test that is abnormal or we need to change your treatment, we will call you to review the results.   Testing/Procedures: None ordered    Follow-Up: At [facility], you and your health needs are our priority.  As part of our continuing mission to provide you with exceptional heart care, we have created designated Provider Care Teams.  These Care Teams include your primary Cardiologist (physician) and Advanced Practice Providers (APPs -  Physician Assistants and Nurse Practitioners) who all work together to provide you with the care you need, when you need it.  We recommend signing up for the patient portal called "MyChart".  Sign up information is provided on this After Visit Summary.  MyChart is used to connect with patients for Virtual Visits (Telemedicine).  Patients are able to view lab/test results, encounter notes, upcoming appointments, etc.  Non-urgent messages can be sent to your provider as well.   To learn more about what you can do with MyChart, go to [email or web address].    Your next appointment:   2 month(s)  The format for your next appointment:   In Person  Provider:   You may see Kate Sable, MD or one of the following Advanced Practice Providers on your designated Care Team:   Murray Hodgkins, NP Christell Faith, PA-C Marrianne Mood, PA-C Cadence Kathlen Mody, [place]   Other Instructions

## 2021-04-14 NOTE — Progress Notes (Signed)
Cardiology Office Note:    Date:  04/14/2021   ID:  Joyce Brock, DOB 1966/01/11, MRN [id number]  PCP:  Jinny Sanders, MD   [facility] Providers Cardiologist:  Kate Sable, MD     Referring MD: Jinny Sanders, MD   Chief Complaint  Patient presents with   Other    Past due 6 month follow up -- ED for chest pain. Meds reviewed verbally with patient.     History of Present Illness:    Joyce Brock is a 55 y.o. female with a hx of GERD, anxiety, hyperlipidemia who presents due to chest pain.  Previously seen in the office due to palpitations and syncope.  Work-up with cardiac monitor was unrevealing with no significant arrhythmias.  Syncopal episode was deemed vasovagal/dehydration in origin.  She has symptoms of chest pain 3 days ago on 04/10/2021.  She woke up that day and experience sharp chest discomfort lasting couple of seconds while in the shower.  Also noticed her heart rates were elevated up to 120s.  States having occasional palpitations, but she sometimes notices heart elevated heart rates due to checking her smart watch.  Presented to the ED, work-up did not reveal any acute ischemia, troponins were normal.  Has a history of GERD, reflux symptoms much improved with taking Protonix daily.  Echo 01/2020 showed normal systolic function, no wall motion abnormalities, EF 60 to 65%.  Past Medical History:  Diagnosis Date   Allergy    Anxiety    GERD (gastroesophageal reflux disease)    Hyperlipidemia    Migraines     Past Surgical History:  Procedure Laterality Date   CHOLECYSTECTOMY      Current Medications: Current Meds  Medication Sig   acetaminophen (TYLENOL) 500 MG tablet Take 1,000 mg by mouth every 6 (six) hours as needed.   atorvastatin (LIPITOR) 10 MG tablet TAKE 1 TABLET BY MOUTH DAILY.   Calcium Carbonate-Vit D-Min (CALCIUM 600+D PLUS MINERALS) 600-400 MG-UNIT TABS Take 2 tablets by mouth daily.   cetirizine (ZYRTEC) 10 MG tablet Take  1 tablet by mouth once daily as needed   COVID-19 mRNA bivalent vaccine, Pfizer, (PFIZER COVID-19 VAC BIVALENT) injection Inject into the muscle.   EPINEPHrine 0.3 mg/0.3 mL IJ SOAJ injection Inject into the muscle.   fluticasone (FLONASE) 50 MCG/ACT nasal spray SPRAY TWO SPRAYS IN EACH NOSTRIL ONCE DAILY   hydrOXYzine (ATARAX/VISTARIL) 50 MG tablet Take 1 tablet (50 mg total) by mouth 3 (three) times daily as needed for anxiety or nausea.   metoprolol succinate (TOPROL XL) 25 MG 24 hr tablet Take 1 tablet (25 mg total) by mouth daily.   pantoprazole (PROTONIX) 40 MG tablet Take 1 tablet (40 mg total) by mouth as needed.   vitamin B-12 (CYANOCOBALAMIN) 1000 MCG tablet Take 1,000 mcg by mouth daily.     Allergies:   Patient has no known allergies.   Social History   Socioeconomic History   Marital status: Married    Spouse name: Not on file   Number of children: 2   Years of education: Not on file   Highest education level: Not on file  Occupational History   Not on file  Tobacco Use   Smoking status: Former   Smokeless tobacco: Never  Vaping Use   Vaping Use: Never used  Substance and Sexual Activity   Alcohol use: Yes    Comment: Rare   Drug use: No   Sexual activity: Not on file  Other  Topics Concern   Not on file  Social History Narrative   Right handed   No caffeine   Three story home   Social Determinants of Health   Financial Resource Strain: Not on file  Food Insecurity: Not on file  Transportation Needs: Not on file  Physical Activity: Not on file  Stress: Not on file  Social Connections: Not on file     Family History: The patient's family history includes Cancer (age of onset: 33) in her sister; Irritable bowel syndrome in her mother; Osteoporosis in her mother; Stomach cancer in her paternal grandfather. There is no history of Colon cancer, Esophageal cancer, or Rectal cancer.  ROS:   Please see the history of present illness.     All other systems  reviewed and are negative.  EKGs/Labs/Other Studies Reviewed:    The following studies were reviewed today:   EKG:  EKG is  ordered today.  The ekg ordered today demonstrates normal sinus rhythm, heart rate 87.  Recent Labs: 02/16/2021: ALT 15 04/10/2021: BUN 14; Creatinine, Ser 0.86; Hemoglobin 13.9; Magnesium 2.0; Platelets 187; Potassium 3.7; Sodium 139; TSH 4.207  Recent Lipid Panel    Component Value Date/Time   CHOL 201 (H) 02/16/2021 0742   TRIG 128.0 02/16/2021 0742   HDL 52.70 02/16/2021 0742   CHOLHDL 4 02/16/2021 0742   VLDL 25.6 02/16/2021 0742   LDLCALC 123 (H) 02/16/2021 0742   LDLDIRECT 168.7 03/22/2012 0832     Risk Assessment/Calculations:         Physical Exam:    VS:  BP 110/80 (BP Location: Left Arm, Patient Position: Sitting, Cuff Size: Normal)   Pulse 87   Ht 5\' 6"  (1.676 m)   Wt 152 lb (68.9 kg)   LMP 03/23/2013   SpO2 97%   BMI 24.53 kg/m     Wt Readings from Last 3 Encounters:  04/14/21 152 lb (68.9 kg)  03/10/21 154 lb (69.9 kg)  04/02/20 161 lb (73 kg)     GEN:  Well nourished, well developed in no acute distress HEENT: Normal NECK: No JVD; No carotid bruits LYMPHATICS: No lymphadenopathy CARDIAC: RRR, no murmurs, rubs, gallops RESPIRATORY:  Clear to auscultation without rales, wheezing or rhonchi  ABDOMEN: Soft, non-tender, non-distended MUSCULOSKELETAL:  No edema; No deformity  SKIN: Warm and dry NEUROLOGIC:  Alert and oriented x 3 PSYCHIATRIC:  Normal affect   ASSESSMENT:    1. Chest pain of uncertain etiology   2. Pure hypercholesterolemia   3. Tachycardia    PLAN:    In order of problems listed above:  Chest pain, atypical/noncardiac in origin.  Echo with EF 60 to 65%.  Continue medications for GERD, continue to monitor. Hyperlipidemia, continue statin. Episodes of tachycardia, anxiety.  Advised patient to follow-up with PCP regarding management of anxiety.  We will start Toprol-XL 25 mg daily to help with both  tachycardia and also anxiety.  Follow-up in 2 months.     Medication Adjustments/Labs and Tests Ordered: Current medicines are reviewed at length with the patient today.  Concerns regarding medicines are outlined above.  Orders Placed This Encounter  Procedures   EKG 12-Lead    Meds ordered this encounter  Medications   metoprolol succinate (TOPROL XL) 25 MG 24 hr tablet    Sig: Take 1 tablet (25 mg total) by mouth daily.    Dispense:  30 tablet    Refill:  3     Patient Instructions  Medication Instructions:   Your physician  has recommended you make the following change in your medication:    START taking Toprol XL 25 MG once a day.  *If you need a refill on your cardiac medications before your next appointment, please call your pharmacy*   Lab Work: None ordered If you have labs (blood work) drawn today and your tests are completely normal, you will receive your results only by: [place] (if you have MyChart) OR A paper copy in the mail If you have any lab test that is abnormal or we need to change your treatment, we will call you to review the results.   Testing/Procedures: None ordered    Follow-Up: At [facility], you and your health needs are our priority.  As part of our continuing mission to provide you with exceptional heart care, we have created designated Provider Care Teams.  These Care Teams include your primary Cardiologist (physician) and Advanced Practice Providers (APPs -  Physician Assistants and Nurse Practitioners) who all work together to provide you with the care you need, when you need it.  We recommend signing up for the patient portal called "MyChart".  Sign up information is provided on this After Visit Summary.  MyChart is used to connect with patients for Virtual Visits (Telemedicine).  Patients are able to view lab/test results, encounter notes, upcoming appointments, etc.  Non-urgent messages can be sent to your provider as well.    To learn more about what you can do with MyChart, go to [email or web address].    Your next appointment:   2 month(s)  The format for your next appointment:   In Person  Provider:   You may see Kate Sable, MD or one of the following Advanced Practice Providers on your designated Care Team:   Murray Hodgkins, NP Christell Faith, PA-C Marrianne Mood, PA-C Cadence [place]lake, [place]   Other Instructions    Signed, Kate Sable, MD  04/14/2021 12:41 PM    [place]

## 2021-04-15 ENCOUNTER — Encounter: Payer: Self-pay | Admitting: Family Medicine

## 2021-04-21 DIAGNOSIS — J301 Allergic rhinitis due to pollen: Secondary | ICD-10-CM | POA: Diagnosis not present

## 2021-04-22 ENCOUNTER — Ambulatory Visit: Payer: 59 | Admitting: Cardiology

## 2021-04-22 ENCOUNTER — Other Ambulatory Visit (HOSPITAL_COMMUNITY): Payer: Self-pay

## 2021-04-27 DIAGNOSIS — J301 Allergic rhinitis due to pollen: Secondary | ICD-10-CM | POA: Diagnosis not present

## 2021-05-12 DIAGNOSIS — J301 Allergic rhinitis due to pollen: Secondary | ICD-10-CM | POA: Diagnosis not present

## 2021-05-13 ENCOUNTER — Other Ambulatory Visit (HOSPITAL_COMMUNITY): Payer: Self-pay

## 2021-05-16 ENCOUNTER — Other Ambulatory Visit (HOSPITAL_COMMUNITY): Payer: Self-pay

## 2021-05-19 DIAGNOSIS — J301 Allergic rhinitis due to pollen: Secondary | ICD-10-CM | POA: Diagnosis not present

## 2021-05-26 ENCOUNTER — Other Ambulatory Visit: Payer: Self-pay

## 2021-05-26 ENCOUNTER — Ambulatory Visit (INDEPENDENT_AMBULATORY_CARE_PROVIDER_SITE_OTHER): Payer: 59 | Admitting: Sports Medicine

## 2021-05-26 ENCOUNTER — Other Ambulatory Visit (HOSPITAL_COMMUNITY): Payer: Self-pay

## 2021-05-26 DIAGNOSIS — B351 Tinea unguium: Secondary | ICD-10-CM

## 2021-05-26 DIAGNOSIS — J301 Allergic rhinitis due to pollen: Secondary | ICD-10-CM | POA: Diagnosis not present

## 2021-05-26 DIAGNOSIS — B070 Plantar wart: Secondary | ICD-10-CM | POA: Diagnosis not present

## 2021-05-26 DIAGNOSIS — M79671 Pain in right foot: Secondary | ICD-10-CM

## 2021-05-26 MED ORDER — CIMETIDINE 200 MG PO TABS
200.0000 mg | ORAL_TABLET | Freq: Two times a day (BID) | ORAL | 0 refills | Status: DC
  Filled 2021-05-26: qty 60, 30d supply, fill #0

## 2021-05-26 NOTE — Progress Notes (Signed)
Subjective: Joyce Brock is a 55 y.o. female patient returns to office for follow-up evaluation of right foot wart.  Patient reports wart is seems like it is coming back.  States that her nails are good laser treatment has helped and they look normal again.  Denies any other changes or pedal complaints at this time.   Patient Active Problem List   Diagnosis Date Noted   GERD (gastroesophageal reflux disease) 01/16/2020   Tachycardia    Vasovagal syncope 01/11/2020   Pulmonary nodule    GAD (generalized anxiety disorder) 10/04/2018   Panic attack 10/04/2018   Nausea 10/04/2018   Atopic dermatitis 01/18/2017   Left upper arm pain 09/25/2014   Verruca vulgaris 11/15/2012   Rectal pain 03/29/2012   Palpitations 12/26/2011   HYPERCHOLESTEROLEMIA 02/05/2009   ALLERGIC RHINITIS 10/30/2008   PSORIASIS 10/30/2008   URINARY INCONTINENCE, MIXED 10/30/2008    Current Outpatient Medications on File Prior to Visit  Medication Sig Dispense Refill   acetaminophen (TYLENOL) 500 MG tablet Take 1,000 mg by mouth every 6 (six) hours as needed.     atorvastatin (LIPITOR) 10 MG tablet TAKE 1 TABLET BY MOUTH DAILY. 90 tablet 3   Calcium Carbonate-Vit D-Min (CALCIUM 600+D PLUS MINERALS) 600-400 MG-UNIT TABS Take 2 tablets by mouth daily.     cetirizine (ZYRTEC) 10 MG tablet Take 1 tablet by mouth once daily as needed 60 tablet 14   COVID-19 mRNA bivalent vaccine, Pfizer, (PFIZER COVID-19 VAC BIVALENT) injection Inject into the muscle. 0.3 mL 0   EPINEPHrine 0.3 mg/0.3 mL IJ SOAJ injection Inject into the muscle.     fluticasone (FLONASE) 50 MCG/ACT nasal spray SPRAY TWO SPRAYS IN EACH NOSTRIL ONCE DAILY 48 g 12   hydrOXYzine (ATARAX/VISTARIL) 50 MG tablet Take 1 tablet (50 mg total) by mouth 3 (three) times daily as needed for anxiety or nausea. 30 tablet 0   metoprolol succinate (TOPROL XL) 25 MG 24 hr tablet Take 1 tablet (25 mg total) by mouth daily. 30 tablet 3   pantoprazole (PROTONIX) 40 MG  tablet Take 1 tablet (40 mg total) by mouth as needed. 30 tablet 1   vitamin B-12 (CYANOCOBALAMIN) 1000 MCG tablet Take 1,000 mcg by mouth daily.     No current facility-administered medications on file prior to visit.    No Known Allergies  Objective:  General: Alert and oriented x3 in no acute distress  Dermatology: Minimal keratotic lesion present at ball of the right foot x1 with no skin lines transversing the lesion,no webspace macerations, no ecchymosis bilateral, all nails x 10 are well manicured previous fungus appears to be resolved.  Vascular: Dorsalis Pedis and Posterior Tibial pedal pulses 2/4, Capillary Fill Time 3 seconds, + pedal hair growth bilateral, no edema bilateral lower extremities, Temperature gradient within normal limits.  Neurology: Johney Maine sensation intact via light touch bilateral.  Musculoskeletal: Minimal tenderness with palpation at the lesion site on Right forefoot, muscular strength 5/5 in all groups without pain or limitation on range of motion. No other lower extremity muscular or boney deformity noted.  Assessment and Plan: Problem List Items Addressed This Visit   None Visit Diagnoses     Plantar wart    -  Primary   Right foot pain       Nail fungus           -Complete examination performed -Re-Discussed continued care for wart -To ball of right foot treated the warts x 1 with Cantharone covered with bandaid; Advised patient  of peeling reaction that may occur -Dispensed Tagamet for patient to take as directed to help with fighting off plantar wart -Encouraged again good hygiene habits to prevent reinfection or worsening of wart -Continue with monitoring nails previous nail fungus appears to be resolved after laser treatment -Patient to return to office as needed or sooner if problems or issues arise.   Landis Martins, DPM

## 2021-05-27 ENCOUNTER — Other Ambulatory Visit (HOSPITAL_COMMUNITY): Payer: Self-pay

## 2021-06-02 DIAGNOSIS — J301 Allergic rhinitis due to pollen: Secondary | ICD-10-CM | POA: Diagnosis not present

## 2021-06-13 ENCOUNTER — Other Ambulatory Visit (HOSPITAL_COMMUNITY): Payer: Self-pay

## 2021-06-16 DIAGNOSIS — J301 Allergic rhinitis due to pollen: Secondary | ICD-10-CM | POA: Diagnosis not present

## 2021-06-23 ENCOUNTER — Ambulatory Visit (INDEPENDENT_AMBULATORY_CARE_PROVIDER_SITE_OTHER): Payer: 59 | Admitting: Cardiology

## 2021-06-23 ENCOUNTER — Other Ambulatory Visit: Payer: Self-pay

## 2021-06-23 ENCOUNTER — Encounter: Payer: Self-pay | Admitting: Cardiology

## 2021-06-23 VITALS — BP 110/76 | HR 80 | Ht 66.0 in | Wt 163.2 lb

## 2021-06-23 DIAGNOSIS — R000 Tachycardia, unspecified: Secondary | ICD-10-CM | POA: Diagnosis not present

## 2021-06-23 DIAGNOSIS — E7800 Pure hypercholesterolemia, unspecified: Secondary | ICD-10-CM | POA: Diagnosis not present

## 2021-06-23 DIAGNOSIS — J301 Allergic rhinitis due to pollen: Secondary | ICD-10-CM | POA: Diagnosis not present

## 2021-06-23 NOTE — Patient Instructions (Signed)
Medication Instructions:  Your physician recommends that you continue on your current medications as directed. Please refer to the Current Medication list given to you today.  *If you need a refill on your cardiac medications before your next appointment, please call your pharmacy*   Lab Work: None Ordered If you have labs (blood work) drawn today and your tests are completely normal, you will receive your results only by: [place] (if you have MyChart) OR A paper copy in the mail If you have any lab test that is abnormal or we need to change your treatment, we will call you to review the results.   Testing/Procedures: None ordered   Follow-Up: At [facility], you and your health needs are our priority.  As part of our continuing mission to provide you with exceptional heart care, we have created designated Provider Care Teams.  These Care Teams include your primary Cardiologist (physician) and Advanced Practice Providers (APPs -  Physician Assistants and Nurse Practitioners) who all work together to provide you with the care you need, when you need it.  We recommend signing up for the patient portal called "MyChart".  Sign up information is provided on this After Visit Summary.  MyChart is used to connect with patients for Virtual Visits (Telemedicine).  Patients are able to view lab/test results, encounter notes, upcoming appointments, etc.  Non-urgent messages can be sent to your provider as well.   To learn more about what you can do with MyChart, go to [email or web address].    Your next appointment:   1 year(s)  The format for your next appointment:   In Person  Provider:   You may see Kate Sable, MD or one of the following Advanced Practice Providers on your designated Care Team:   Murray Hodgkins, NP Christell Faith, PA-C Cadence Kathlen Mody, [place]    Other Instructions

## 2021-06-23 NOTE — Progress Notes (Signed)
Cardiology Office Note:    Date:  06/23/2021   ID:  Joyce Brock, DOB 1966-01-14, MRN [id number]  PCP:  Jinny Sanders, MD   [facility] Providers Cardiologist:  Kate Sable, MD     Referring MD: Jinny Sanders, MD   No chief complaint on file.   History of Present Illness:    Joyce Brock is a 56 y.o. female with a hx of GERD, anxiety, hyperlipidemia who presents for follow-up.   Previously seen due to tachycardia and chest pain.  Chest pain not consistent with angina, echocardiogram obtained was normal.  Prior cardiac monitor with no significant arrhythmias, occasional PACs and PVCs.  Due to anxiety and tachycardia, Toprol-XL was started.  Patient states symptoms are much improved and she feels much better since taking Toprol-XL.  She feels well, has no other concerns at this time.   Prior notes/studies Echo 01/2020 showed normal systolic function, no wall motion abnormalities, EF 60 to 65%. Cardiac monitor 02/2020 occasional PACs and PVCs, no significant arrhythmias.  Past Medical History:  Diagnosis Date   Allergy    Anxiety    GERD (gastroesophageal reflux disease)    Hyperlipidemia    Migraines     Past Surgical History:  Procedure Laterality Date   CHOLECYSTECTOMY      Current Medications: Current Meds  Medication Sig   acetaminophen (TYLENOL) 500 MG tablet Take 1,000 mg by mouth every 6 (six) hours as needed.   atorvastatin (LIPITOR) 10 MG tablet TAKE 1 TABLET BY MOUTH DAILY.   Calcium Carbonate-Vit D-Min (CALCIUM 600+D PLUS MINERALS) 600-400 MG-UNIT TABS Take 2 tablets by mouth daily.   cetirizine (ZYRTEC) 10 MG tablet Take 1 tablet by mouth once daily as needed   cimetidine (TAGAMET HB) 200 MG tablet Take 1 tablet (200 mg total) by mouth 2 (two) times daily.   COVID-19 mRNA bivalent vaccine, Pfizer, (PFIZER COVID-19 VAC BIVALENT) injection Inject into the muscle.   EPINEPHrine 0.3 mg/0.3 mL IJ SOAJ injection Inject into the muscle.    fluticasone (FLONASE) 50 MCG/ACT nasal spray SPRAY TWO SPRAYS IN EACH NOSTRIL ONCE DAILY   hydrOXYzine (ATARAX/VISTARIL) 50 MG tablet Take 1 tablet (50 mg total) by mouth 3 (three) times daily as needed for anxiety or nausea.   metoprolol succinate (TOPROL XL) 25 MG 24 hr tablet Take 1 tablet (25 mg total) by mouth daily.   pantoprazole (PROTONIX) 40 MG tablet Take 1 tablet (40 mg total) by mouth as needed.   vitamin B-12 (CYANOCOBALAMIN) 1000 MCG tablet Take 1,000 mcg by mouth daily.     Allergies:   Patient has no known allergies.   Social History   Socioeconomic History   Marital status: Married    Spouse name: Not on file   Number of children: 2   Years of education: Not on file   Highest education level: Not on file  Occupational History   Not on file  Tobacco Use   Smoking status: Former   Smokeless tobacco: Never  Vaping Use   Vaping Use: Never used  Substance and Sexual Activity   Alcohol use: Yes    Comment: Rare   Drug use: No   Sexual activity: Not on file  Other Topics Concern   Not on file  Social History Narrative   Right handed   No caffeine   Three story home   Social Determinants of Health   Financial Resource Strain: Not on file  Food Insecurity: Not on file  Transportation  Needs: Not on file  Physical Activity: Not on file  Stress: Not on file  Social Connections: Not on file     Family History: The patient's family history includes Cancer (age of onset: 73) in her sister; Irritable bowel syndrome in her mother; Osteoporosis in her mother; Stomach cancer in her paternal grandfather. There is no history of Colon cancer, Esophageal cancer, or Rectal cancer.  ROS:   Please see the history of present illness.     All other systems reviewed and are negative.  EKGs/Labs/Other Studies Reviewed:    The following studies were reviewed today:   EKG:  EKG is  ordered today.  The ekg ordered today demonstrates normal sinus rhythm, heart rate  87.  Recent Labs: 02/16/2021: ALT 15 04/10/2021: BUN 14; Creatinine, Ser 0.86; Hemoglobin 13.9; Magnesium 2.0; Platelets 187; Potassium 3.7; Sodium 139; TSH 4.207  Recent Lipid Panel    Component Value Date/Time   CHOL 201 (H) 02/16/2021 0742   TRIG 128.0 02/16/2021 0742   HDL 52.70 02/16/2021 0742   CHOLHDL 4 02/16/2021 0742   VLDL 25.6 02/16/2021 0742   LDLCALC 123 (H) 02/16/2021 0742   LDLDIRECT 168.7 03/22/2012 0832     Risk Assessment/Calculations:         Physical Exam:    VS:  BP 110/76    Pulse 80    Ht 5\' 6"  (1.676 m)    Wt 163 lb 3.2 oz (74 kg)    LMP 03/23/2013    SpO2 99%    BMI 26.34 kg/m     Wt Readings from Last 3 Encounters:  06/23/21 163 lb 3.2 oz (74 kg)  04/14/21 152 lb (68.9 kg)  03/10/21 154 lb (69.9 kg)     GEN:  Well nourished, well developed in no acute distress HEENT: Normal NECK: No JVD; No carotid bruits LYMPHATICS: No lymphadenopathy CARDIAC: RRR, no murmurs, rubs, gallops RESPIRATORY:  Clear to auscultation without rales, wheezing or rhonchi  ABDOMEN: Soft, non-tender, non-distended MUSCULOSKELETAL:  No edema; No deformity  SKIN: Warm and dry NEUROLOGIC:  Alert and oriented x 3 PSYCHIATRIC:  Normal affect   ASSESSMENT:    1. Pure hypercholesterolemia   2. Tachycardia     PLAN:    In order of problems listed above:  Hyperlipidemia, continue statin. Episodes of tachycardia, history of PACs, PVCs anxiety.  Symptoms improved with starting Toprol-XL.  Continue Toprol-XL.  Plans to follow-up with PCP regarding anxiety management if needed.    Follow-up yearly.    Medication Adjustments/Labs and Tests Ordered: Current medicines are reviewed at length with the patient today.  Concerns regarding medicines are outlined above.  Orders Placed This Encounter  Procedures   EKG 12-Lead    No orders of the defined types were placed in this encounter.    Patient Instructions  Medication Instructions:  Your physician recommends that  you continue on your current medications as directed. Please refer to the Current Medication list given to you today.  *If you need a refill on your cardiac medications before your next appointment, please call your pharmacy*   Lab Work: None Ordered If you have labs (blood work) drawn today and your tests are completely normal, you will receive your results only by: [place] (if you have MyChart) OR A paper copy in the mail If you have any lab test that is abnormal or we need to change your treatment, we will call you to review the results.   Testing/Procedures: None ordered   Follow-Up:  At [facility], you and your health needs are our priority.  As part of our continuing mission to provide you with exceptional heart care, we have created designated Provider Care Teams.  These Care Teams include your primary Cardiologist (physician) and Advanced Practice Providers (APPs -  Physician Assistants and Nurse Practitioners) who all work together to provide you with the care you need, when you need it.  We recommend signing up for the patient portal called "MyChart".  Sign up information is provided on this After Visit Summary.  MyChart is used to connect with patients for Virtual Visits (Telemedicine).  Patients are able to view lab/test results, encounter notes, upcoming appointments, etc.  Non-urgent messages can be sent to your provider as well.   To learn more about what you can do with MyChart, go to [email or web address].    Your next appointment:   1 year(s)  The format for your next appointment:   In Person  Provider:   You may see Kate Sable, MD or one of the following Advanced Practice Providers on your designated Care Team:   Murray Hodgkins, NP Christell Faith, PA-C Cadence Kathlen Mody, [place]    Other Instructions     Signed, Kate Sable, MD  06/23/2021 11:46 AM    [place]

## 2021-06-24 DIAGNOSIS — J301 Allergic rhinitis due to pollen: Secondary | ICD-10-CM | POA: Diagnosis not present

## 2021-06-30 DIAGNOSIS — Z20828 Contact with and (suspected) exposure to other viral communicable diseases: Secondary | ICD-10-CM | POA: Diagnosis not present

## 2021-06-30 DIAGNOSIS — J301 Allergic rhinitis due to pollen: Secondary | ICD-10-CM | POA: Diagnosis not present

## 2021-07-06 ENCOUNTER — Other Ambulatory Visit (HOSPITAL_COMMUNITY): Payer: Self-pay

## 2021-07-07 DIAGNOSIS — J301 Allergic rhinitis due to pollen: Secondary | ICD-10-CM | POA: Diagnosis not present

## 2021-07-14 DIAGNOSIS — J301 Allergic rhinitis due to pollen: Secondary | ICD-10-CM | POA: Diagnosis not present

## 2021-07-21 ENCOUNTER — Other Ambulatory Visit (HOSPITAL_COMMUNITY): Payer: Self-pay

## 2021-07-21 DIAGNOSIS — J301 Allergic rhinitis due to pollen: Secondary | ICD-10-CM | POA: Diagnosis not present

## 2021-07-28 DIAGNOSIS — J301 Allergic rhinitis due to pollen: Secondary | ICD-10-CM | POA: Diagnosis not present

## 2021-08-04 DIAGNOSIS — J301 Allergic rhinitis due to pollen: Secondary | ICD-10-CM | POA: Diagnosis not present

## 2021-08-11 DIAGNOSIS — J301 Allergic rhinitis due to pollen: Secondary | ICD-10-CM | POA: Diagnosis not present

## 2021-08-15 ENCOUNTER — Encounter: Payer: Self-pay | Admitting: Family Medicine

## 2021-08-17 ENCOUNTER — Telehealth: Payer: Self-pay | Admitting: Family Medicine

## 2021-08-17 NOTE — Telephone Encounter (Signed)
Joyce Brock has been complaining of some abdominal issues, wants to know if there is any way Dr.Bedsole could see her before the 16th  ?

## 2021-08-17 NOTE — Telephone Encounter (Signed)
Please triage

## 2021-08-17 NOTE — Telephone Encounter (Signed)
Pt said starting on 08/03/21 pt began with abd discomfort with a crampy menstrual type pain of pain level 3 - 4.now pt has tenderness or soreness mid to left upper abd pain. Today pt feels bloated and has only eaten some jello, banana, yogurt and few crackers. The bloated feeling started on 08/16/21. Visually pts abd does not appear swollen.  Pt has had diarrhea on and off and pt had been eating bland or soft foods that were easy to digest and diarrhea calmed down. On 08/14/21 pt ate shrimp and rice and diarrhea restarted and pt is not sure when diarrhea stopped; but today had more normal BM but there was something like white mucus in BM also. NO blood seen.  No vomiting and no fever. Pt has been able to pass some gas also. Pt said she is not in distress but due to timeline in pt message by Dr Diona Browner pt wanted to be seen this week. Butch Penny CMA opened appt on 08/19/21 at 8:40 and pt was very appreciative. Butch Penny will add pt and I cancelled the 08/25/21 appt. Sending note to DR Diona Browner and Butch Penny CMA. ?

## 2021-08-18 DIAGNOSIS — J301 Allergic rhinitis due to pollen: Secondary | ICD-10-CM | POA: Diagnosis not present

## 2021-08-18 NOTE — Telephone Encounter (Signed)
Noted. Agree with earlier appointment. ?

## 2021-08-19 ENCOUNTER — Other Ambulatory Visit: Payer: Self-pay

## 2021-08-19 ENCOUNTER — Other Ambulatory Visit (HOSPITAL_COMMUNITY): Payer: Self-pay

## 2021-08-19 ENCOUNTER — Encounter: Payer: Self-pay | Admitting: Family Medicine

## 2021-08-19 ENCOUNTER — Ambulatory Visit: Payer: 59 | Admitting: Family Medicine

## 2021-08-19 VITALS — BP 92/70 | HR 100 | Temp 98.4°F | Ht 66.25 in | Wt 158.1 lb

## 2021-08-19 DIAGNOSIS — R197 Diarrhea, unspecified: Secondary | ICD-10-CM | POA: Diagnosis not present

## 2021-08-19 DIAGNOSIS — R1032 Left lower quadrant pain: Secondary | ICD-10-CM | POA: Diagnosis not present

## 2021-08-19 LAB — POC URINALSYSI DIPSTICK (AUTOMATED)
Bilirubin, UA: NEGATIVE
Glucose, UA: NEGATIVE
Ketones, UA: NEGATIVE
Leukocytes, UA: NEGATIVE
Nitrite, UA: NEGATIVE
Protein, UA: NEGATIVE
Spec Grav, UA: 1.015 (ref 1.010–1.025)
Urobilinogen, UA: 0.2 E.U./dL
pH, UA: 6 (ref 5.0–8.0)

## 2021-08-19 LAB — CBC WITH DIFFERENTIAL/PLATELET
Basophils Absolute: 0 10*3/uL (ref 0.0–0.1)
Basophils Relative: 1 % (ref 0.0–3.0)
Eosinophils Absolute: 0.1 10*3/uL (ref 0.0–0.7)
Eosinophils Relative: 3.1 % (ref 0.0–5.0)
HCT: 41.2 % (ref 36.0–46.0)
Hemoglobin: 13.9 g/dL (ref 12.0–15.0)
Lymphocytes Relative: 35.1 % (ref 12.0–46.0)
Lymphs Abs: 1.5 10*3/uL (ref 0.7–4.0)
MCHC: 33.7 g/dL (ref 30.0–36.0)
MCV: 86 fl (ref 78.0–100.0)
Monocytes Absolute: 0.2 10*3/uL (ref 0.1–1.0)
Monocytes Relative: 5.2 % (ref 3.0–12.0)
Neutro Abs: 2.4 10*3/uL (ref 1.4–7.7)
Neutrophils Relative %: 55.6 % (ref 43.0–77.0)
Platelets: 158 10*3/uL (ref 150.0–400.0)
RBC: 4.79 Mil/uL (ref 3.87–5.11)
RDW: 13.4 % (ref 11.5–15.5)
WBC: 4.3 10*3/uL (ref 4.0–10.5)

## 2021-08-19 LAB — COMPREHENSIVE METABOLIC PANEL
ALT: 16 U/L (ref 0–35)
AST: 18 U/L (ref 0–37)
Albumin: 4.4 g/dL (ref 3.5–5.2)
Alkaline Phosphatase: 73 U/L (ref 39–117)
BUN: 12 mg/dL (ref 6–23)
CO2: 30 mEq/L (ref 19–32)
Calcium: 9.8 mg/dL (ref 8.4–10.5)
Chloride: 102 mEq/L (ref 96–112)
Creatinine, Ser: 0.87 mg/dL (ref 0.40–1.20)
GFR: 74.4 mL/min (ref >60.00)
Glucose, Bld: 89 mg/dL (ref 70–99)
Potassium: 4.3 mEq/L (ref 3.5–5.1)
Sodium: 139 mEq/L (ref 135–145)
Total Bilirubin: 0.6 mg/dL (ref 0.2–1.2)
Total Protein: 7 g/dL (ref 6.0–8.3)

## 2021-08-19 MED ORDER — METOPROLOL SUCCINATE ER 25 MG PO TB24
25.0000 mg | ORAL_TABLET | Freq: Every day | ORAL | 8 refills | Status: DC
  Filled 2021-08-19: qty 30, 30d supply, fill #0
  Filled 2021-09-20: qty 30, 30d supply, fill #1
  Filled 2021-10-26: qty 30, 30d supply, fill #2
  Filled 2021-11-22: qty 30, 30d supply, fill #0
  Filled 2021-12-28: qty 30, 30d supply, fill #1
  Filled 2022-01-26: qty 30, 30d supply, fill #2
  Filled 2022-02-28: qty 30, 30d supply, fill #3
  Filled 2022-04-10: qty 30, 30d supply, fill #4
  Filled 2022-05-06: qty 30, 30d supply, fill #5

## 2021-08-19 NOTE — Progress Notes (Incomplete)
Patient ID: Joyce Brock, female    DOB: 08/30/1965, 56 y.o.   MRN: [id number]  This visit was conducted in person.  BP 92/70    Pulse 100    Temp 98.4 F (36.9 C) (Oral)    Ht 5' 6.25" (1.683 m)    Wt 158 lb 2 oz (71.7 kg)    LMP 03/23/2013    SpO2 97%    BMI 25.33 kg/m    CC: Chief Complaint  Patient presents with   Abdominal Pain    LLQ Soreness/Cramping    Subjective:   HPI: Joyce Brock is a 57 y.o. female  with history of GERD, GAD, high cholesterol, internal hemorrhoids presenting on 08/19/2021 for Abdominal Pain (LLQ Soreness/Cramping)   2 weeks ago.. started with diarrhea episodes, next day she felt a vibration in lower abdomen.  Vibration feeling lasted 12 hours.   The following day she started with cramping in bilateral lower abdomen. She changed to a liquid diet, bland diet.. so diarrhea decreased.    Cramping seem to be getting better until ate [place] on  3/4.Marland Kitchen all symptoms came back diarrhea, cramping and low abd pain. Keeping up with water.. returned to balnd diet. Last night had more solid small BM. Did see some mucus in stool once.  Now more tender on left lower quadrant in the last several days.   No blood in stool.  No fever.  No dysuria.   No new meds.   No sick contacts.    2019 Colonoscopy: tics, polyp  Relevant past medical, surgical, family and social history reviewed and updated as indicated. Interim medical history since our last visit reviewed. Allergies and medications reviewed and updated. Outpatient Medications Prior to Visit  Medication Sig Dispense Refill   acetaminophen (TYLENOL) 500 MG tablet Take 1,000 mg by mouth every 6 (six) hours as needed.     atorvastatin (LIPITOR) 10 MG tablet TAKE 1 TABLET BY MOUTH DAILY. 90 tablet 3   Calcium Carbonate-Vit D-Min (CALCIUM 600+D PLUS MINERALS) 600-400 MG-UNIT TABS Take 2 tablets by mouth daily.     cetirizine (ZYRTEC) 10 MG tablet Take 1 tablet by mouth once daily as needed  60 tablet 14   cimetidine (TAGAMET HB) 200 MG tablet Take 1 tablet (200 mg total) by mouth 2 (two) times daily. 60 tablet 0   EPINEPHrine 0.3 mg/0.3 mL IJ SOAJ injection Inject into the muscle.     fluticasone (FLONASE) 50 MCG/ACT nasal spray SPRAY TWO SPRAYS IN EACH NOSTRIL ONCE DAILY 48 g 12   hydrOXYzine (ATARAX/VISTARIL) 50 MG tablet Take 1 tablet (50 mg total) by mouth 3 (three) times daily as needed for anxiety or nausea. 30 tablet 0   metoprolol succinate (TOPROL XL) 25 MG 24 hr tablet Take 1 tablet (25 mg total) by mouth daily. 30 tablet 3   vitamin B-12 (CYANOCOBALAMIN) 1000 MCG tablet Take 1,000 mcg by mouth daily.     pantoprazole (PROTONIX) 40 MG tablet Take 1 tablet (40 mg total) by mouth as needed. (Patient not taking: Reported on 08/19/2021) 30 tablet 1   COVID-19 mRNA bivalent vaccine, Pfizer, (PFIZER COVID-19 VAC BIVALENT) injection Inject into the muscle. 0.3 mL 0   No facility-administered medications prior to visit.     Per HPI unless specifically indicated in ROS section below Review of Systems Objective:  BP 92/70    Pulse 100    Temp 98.4 F (36.9 C) (Oral)    Ht 5' 6.25" (1.683 m)  Wt 158 lb 2 oz (71.7 kg)    LMP 03/23/2013    SpO2 97%    BMI 25.33 kg/m   Wt Readings from Last 3 Encounters:  08/19/21 158 lb 2 oz (71.7 kg)  06/23/21 163 lb 3.2 oz (74 kg)  04/14/21 152 lb (68.9 kg)      Physical Exam    Results for orders placed or performed in visit on 08/19/21  POCT Urinalysis Dipstick (Automated)  Result Value Ref Range   Color, UA Yellow    Clarity, UA Clear    Glucose, UA Negative Negative   Bilirubin, UA Negative    Ketones, UA Negative    Spec Grav, UA 1.015 1.010 - 1.025   Blood, UA Trace    pH, UA 6.0 5.0 - 8.0   Protein, UA Negative Negative   Urobilinogen, UA 0.2 0.2 or 1.0 E.U./dL   Nitrite, UA Negative    Leukocytes, UA Negative Negative    This visit occurred during the SARS-CoV-2 public health emergency.  Safety protocols were  in place, including screening questions prior to the visit, additional usage of staff PPE, and extensive cleaning of exam room while observing appropriate contact time as indicated for disinfecting solutions.   COVID 19 screen:  No recent travel or known exposure to COVID19 The patient denies respiratory symptoms of COVID 19 at this time. The importance of social distancing was discussed today.   Assessment and Plan     Eliezer Lofts, MD

## 2021-08-19 NOTE — Patient Instructions (Addendum)
Please stop at the lab to have labs drawn. ? Slowly advance diet. ? Call if pain is increasing... we will move ahead with CT of abdomen. ? ?

## 2021-08-19 NOTE — Progress Notes (Signed)
poct

## 2021-08-25 ENCOUNTER — Ambulatory Visit: Payer: 59 | Admitting: Family Medicine

## 2021-08-25 DIAGNOSIS — J301 Allergic rhinitis due to pollen: Secondary | ICD-10-CM | POA: Diagnosis not present

## 2021-09-01 DIAGNOSIS — J301 Allergic rhinitis due to pollen: Secondary | ICD-10-CM | POA: Diagnosis not present

## 2021-09-06 ENCOUNTER — Other Ambulatory Visit (HOSPITAL_COMMUNITY): Payer: Self-pay

## 2021-09-06 ENCOUNTER — Other Ambulatory Visit: Payer: Self-pay | Admitting: Family Medicine

## 2021-09-06 MED ORDER — ATORVASTATIN CALCIUM 10 MG PO TABS
ORAL_TABLET | Freq: Every day | ORAL | 1 refills | Status: DC
  Filled 2021-09-06 – 2022-02-28 (×3): qty 90, 90d supply, fill #0

## 2021-09-08 ENCOUNTER — Other Ambulatory Visit (HOSPITAL_COMMUNITY): Payer: Self-pay

## 2021-09-08 DIAGNOSIS — J301 Allergic rhinitis due to pollen: Secondary | ICD-10-CM | POA: Diagnosis not present

## 2021-09-08 MED ORDER — EPINEPHRINE 0.3 MG/0.3ML IJ SOAJ
INTRAMUSCULAR | 0 refills | Status: DC
  Filled 2021-09-08: qty 2, 5d supply, fill #0

## 2021-09-09 DIAGNOSIS — R1032 Left lower quadrant pain: Secondary | ICD-10-CM | POA: Insufficient documentation

## 2021-09-09 DIAGNOSIS — R197 Diarrhea, unspecified: Secondary | ICD-10-CM | POA: Insufficient documentation

## 2021-09-09 NOTE — Assessment & Plan Note (Signed)
Acute ? ?Mild diverticulitis versus viral gastroenteritis ?Urinalysis was unremarkable in the office today.  No sign of urinary infection or renal stone. ? ?We will start with lab evaluation.  She will have clear liquids and advance as tolerated.  She has been instructed to push fluids to avoid dehydration. ?If her pain persists we can evaluate with an abdominal/pelvic  CT ?

## 2021-09-12 DIAGNOSIS — J301 Allergic rhinitis due to pollen: Secondary | ICD-10-CM | POA: Diagnosis not present

## 2021-09-15 DIAGNOSIS — J301 Allergic rhinitis due to pollen: Secondary | ICD-10-CM | POA: Diagnosis not present

## 2021-09-20 ENCOUNTER — Other Ambulatory Visit (HOSPITAL_COMMUNITY): Payer: Self-pay

## 2021-09-29 DIAGNOSIS — J301 Allergic rhinitis due to pollen: Secondary | ICD-10-CM | POA: Diagnosis not present

## 2021-10-10 ENCOUNTER — Encounter: Payer: Self-pay | Admitting: Family Medicine

## 2021-10-10 DIAGNOSIS — R1032 Left lower quadrant pain: Secondary | ICD-10-CM

## 2021-10-12 ENCOUNTER — Encounter: Payer: Self-pay | Admitting: Family Medicine

## 2021-10-13 DIAGNOSIS — J301 Allergic rhinitis due to pollen: Secondary | ICD-10-CM | POA: Diagnosis not present

## 2021-10-14 ENCOUNTER — Ambulatory Visit: Payer: 59 | Admitting: Family Medicine

## 2021-10-17 ENCOUNTER — Inpatient Hospital Stay: Admission: RE | Admit: 2021-10-17 | Payer: 59 | Source: Ambulatory Visit

## 2021-10-20 DIAGNOSIS — J301 Allergic rhinitis due to pollen: Secondary | ICD-10-CM | POA: Diagnosis not present

## 2021-10-26 ENCOUNTER — Other Ambulatory Visit (HOSPITAL_COMMUNITY): Payer: Self-pay

## 2021-10-27 ENCOUNTER — Other Ambulatory Visit: Payer: 59

## 2021-10-27 DIAGNOSIS — J301 Allergic rhinitis due to pollen: Secondary | ICD-10-CM | POA: Diagnosis not present

## 2021-10-28 ENCOUNTER — Ambulatory Visit (INDEPENDENT_AMBULATORY_CARE_PROVIDER_SITE_OTHER)
Admission: RE | Admit: 2021-10-28 | Discharge: 2021-10-28 | Disposition: A | Discharge status: Home / Self Care | Payer: 59 | Source: Ambulatory Visit | Attending: Family Medicine | Admitting: Family Medicine

## 2021-10-28 DIAGNOSIS — I700 Atherosclerosis of aorta: Secondary | ICD-10-CM | POA: Diagnosis not present

## 2021-10-28 DIAGNOSIS — R109 Unspecified abdominal pain: Secondary | ICD-10-CM | POA: Diagnosis not present

## 2021-10-28 DIAGNOSIS — R1032 Left lower quadrant pain: Secondary | ICD-10-CM

## 2021-10-28 MED ORDER — IOHEXOL 300 MG/ML  SOLN
100.0000 mL | Freq: Once | INTRAMUSCULAR | Status: AC | PRN
  Administered 2021-10-28: 100 mL via INTRAVENOUS

## 2021-11-03 DIAGNOSIS — J301 Allergic rhinitis due to pollen: Secondary | ICD-10-CM | POA: Diagnosis not present

## 2021-11-06 ENCOUNTER — Ambulatory Visit
Admission: RE | Admit: 2021-11-06 | Discharge: 2021-11-06 | Disposition: A | Discharge status: Home / Self Care | Payer: 59 | Source: Ambulatory Visit | Attending: Emergency Medicine | Admitting: Emergency Medicine

## 2021-11-06 VITALS — BP 122/85 | HR 87 | Temp 98.7°F | Resp 17

## 2021-11-06 DIAGNOSIS — J040 Acute laryngitis: Secondary | ICD-10-CM | POA: Diagnosis not present

## 2021-11-06 DIAGNOSIS — B349 Viral infection, unspecified: Secondary | ICD-10-CM

## 2021-11-06 DIAGNOSIS — J029 Acute pharyngitis, unspecified: Secondary | ICD-10-CM

## 2021-11-06 DIAGNOSIS — R509 Fever, unspecified: Secondary | ICD-10-CM

## 2021-11-06 NOTE — ED Provider Notes (Signed)
[facility]    CSN: [id number] Arrival date & time: 11/06/21  1148    HISTORY   Chief Complaint  Patient presents with   Sore Throat    I've had a sore throat for a week 10-29-21). Started with fever but no fever since last Sunday. Ears stuffy, glands swollen, now laryngitis & dry cough have developed over last 2 days. Feel tired. Took COVID test at onset & was negative. - Entered by patient   HPI Joyce Brock is a 56 y.o. female. Patient complains of sore throat that began 8 days ago.  Patient states she initially had a fever 101.5 but none since.  Patient states her ears feel stuffy she has had swollen glands around her neck and has now lost her voice.  Patient states she had a dry cough that has developed over the past 2 days and gotten slightly worse.  Patient states she is feels very tired this time but she has had a negative home COVID test.  Patient states she tried over-the-counter cold and flu preparations with little relief of symptoms.  States she was around her nephew 2 weeks ago who was diagnosed with strep throat however he had been on antibiotics for several days prior to her contact with him.  Patient denies known sick contacts otherwise.  The history is provided by the patient.  Past Medical History:  Diagnosis Date   Allergy    Anxiety    GERD (gastroesophageal reflux disease)    Hyperlipidemia    Migraines    Patient Active Problem List   Diagnosis Date Noted   LLQ pain 09/09/2021   Acute diarrhea 09/09/2021   GERD (gastroesophageal reflux disease) 01/16/2020   Tachycardia    Vasovagal syncope 01/11/2020   Pulmonary nodule    GAD (generalized anxiety disorder) 10/04/2018   Panic attack 10/04/2018   Nausea 10/04/2018   Atopic dermatitis 01/18/2017   Left upper arm pain 09/25/2014   Verruca vulgaris 11/15/2012   Rectal pain 03/29/2012   Palpitations 12/26/2011   HYPERCHOLESTEROLEMIA 02/05/2009   ALLERGIC RHINITIS 10/30/2008   PSORIASIS  10/30/2008   URINARY INCONTINENCE, MIXED 10/30/2008   Past Surgical History:  Procedure Laterality Date   CHOLECYSTECTOMY     OB History   No obstetric history on file.    Home Medications    Prior to Admission medications   Medication Sig Start Date End Date Taking? Authorizing Provider  acetaminophen (TYLENOL) 500 MG tablet Take 1,000 mg by mouth every 6 (six) hours as needed.    [provider]  atorvastatin (LIPITOR) 10 MG tablet TAKE 1 TABLET BY MOUTH DAILY. 09/06/21 09/06/22  Jinny Sanders, MD  Calcium Carbonate-Vit D-Min (CALCIUM 600+D PLUS MINERALS) 600-400 MG-UNIT TABS Take 2 tablets by mouth daily.    [provider]  cetirizine (ZYRTEC) 10 MG tablet Take 1 tablet by mouth once daily as needed 10/14/20     cimetidine (TAGAMET HB) 200 MG tablet Take 1 tablet (200 mg total) by mouth 2 (two) times daily. 05/26/21   Landis Martins, DPM  EPINEPHrine (EPIPEN 2-PAK) 0.3 mg/0.3 mL IJ SOAJ injection Use as directed within package instructions. 09/08/21     EPINEPHrine 0.3 mg/0.3 mL IJ SOAJ injection Inject into the muscle. 01/02/20   [provider]  fluticasone Asencion Islam) 50 MCG/ACT nasal spray SPRAY TWO SPRAYS IN EACH NOSTRIL ONCE DAILY 08/28/19 03/10/22  Beverly Gust, MD  hydrOXYzine (ATARAX/VISTARIL) 50 MG tablet Take 1 tablet (50 mg total) by mouth 3 (  three) times daily as needed for anxiety or nausea. 10/04/18   Bedsole, Amy E, MD  metoprolol succinate (TOPROL XL) 25 MG 24 hr tablet Take 1 tablet (25 mg total) by mouth daily. 08/19/21   Kate Sable, MD  pantoprazole (PROTONIX) 40 MG tablet Take 1 tablet (40 mg total) by mouth as needed. Patient not taking: Reported on 08/19/2021 03/10/21   Jinny Sanders, MD  vitamin B-12 (CYANOCOBALAMIN) 1000 MCG tablet Take 1,000 mcg by mouth daily.    [provider]   Family History Family History  Problem Relation Age of Onset   Osteoporosis Mother    Irritable bowel syndrome Mother    Cancer Sister 88        breast    Stomach cancer Paternal Grandfather    Colon cancer Neg Hx    Esophageal cancer Neg Hx    Rectal cancer Neg Hx    Social History Social History   Tobacco Use   Smoking status: Former   Smokeless tobacco: Never  Scientific laboratory technician Use: Never used  Substance Use Topics   Alcohol use: Yes    Comment: Rare   Drug use: No   Allergies   Patient has no known allergies.  Review of Systems Review of Systems Pertinent findings noted in history of present illness.   Physical Exam Triage Vital Signs ED Triage Vitals  Enc Vitals Group     BP 04/08/21 0827 (!) 147/82     Pulse Rate 04/08/21 0827 72     Resp 04/08/21 0827 18     Temp 04/08/21 0827 98.3 F (36.8 C)     Temp Source 04/08/21 0827 Oral     SpO2 04/08/21 0827 98 %     Weight --      Height --      Head Circumference --      Peak Flow --      Pain Score 04/08/21 0826 5     Pain Loc --      Pain Edu? --      Excl. in [place]? --   No data found.  Updated Vital Signs BP 122/85 (BP Location: Left Arm)   Pulse 87   Temp 98.7 F (37.1 C) (Oral)   Resp 17   LMP 03/23/2013   SpO2 98%   Physical Exam Vitals and nursing note reviewed.  Constitutional:      General: She is not in acute distress.    Appearance: Normal appearance. She is not ill-appearing.  HENT:     Head: Normocephalic and atraumatic.     Salivary Glands: Right salivary gland is not diffusely enlarged or tender. Left salivary gland is not diffusely enlarged or tender.     Right Ear: Ear canal and external ear normal. No drainage. A middle ear effusion is present. There is no impacted cerumen. Tympanic membrane is bulging. Tympanic membrane is not injected or erythematous.     Left Ear: Ear canal and external ear normal. No drainage. A middle ear effusion is present. There is no impacted cerumen. Tympanic membrane is bulging. Tympanic membrane is not injected or erythematous.     Ears:     Comments: Bilateral EACs normal, both TMs  bulging with clear fluid    Nose: Rhinorrhea present. No nasal deformity, septal deviation, signs of injury, nasal tenderness, mucosal edema or congestion. Rhinorrhea is clear.     Right Nostril: Occlusion present. No foreign body, epistaxis or septal hematoma.  Left Nostril: Occlusion present. No foreign body, epistaxis or septal hematoma.     Right Turbinates: Enlarged, swollen and pale.     Left Turbinates: Enlarged, swollen and pale.     Right Sinus: No maxillary sinus tenderness or frontal sinus tenderness.     Left Sinus: No maxillary sinus tenderness or frontal sinus tenderness.     Mouth/Throat:     Lips: Pink. No lesions.     Mouth: Mucous membranes are moist. No oral lesions.     Pharynx: Oropharynx is clear. Uvula midline. No posterior oropharyngeal erythema or uvula swelling.     Tonsils: No tonsillar exudate. 0 on the right. 0 on the left.     Comments: Postnasal drip Eyes:     General: Lids are normal.        Right eye: No discharge.        Left eye: No discharge.     Extraocular Movements: Extraocular movements intact.     Conjunctiva/sclera: Conjunctivae normal.     Right eye: Right conjunctiva is not injected.     Left eye: Left conjunctiva is not injected.  Neck:     Trachea: Trachea and phonation normal.  Cardiovascular:     Rate and Rhythm: Normal rate and regular rhythm.     Pulses: Normal pulses.     Heart sounds: Normal heart sounds. No murmur heard.   No friction rub. No gallop.  Pulmonary:     Effort: Pulmonary effort is normal. No accessory muscle usage, prolonged expiration or respiratory distress.     Breath sounds: Normal breath sounds. No stridor, decreased air movement or transmitted upper airway sounds. No decreased breath sounds, wheezing, rhonchi or rales.  Chest:     Chest wall: No tenderness.  Musculoskeletal:        General: Normal range of motion.     Cervical back: Normal range of motion and neck supple. Normal range of motion.   Lymphadenopathy:     Cervical: No cervical adenopathy.  Skin:    General: Skin is warm and dry.     Findings: No erythema or rash.  Neurological:     General: No focal deficit present.     Mental Status: She is alert and oriented to person, place, and time.  Psychiatric:        Mood and Affect: Mood normal.        Behavior: Behavior normal.    Visual Acuity Right Eye Distance:   Left Eye Distance:   Bilateral Distance:    Right Eye Near:   Left Eye Near:    Bilateral Near:     UC Couse / Diagnostics / Procedures:    EKG  Radiology No results found.  Procedures Procedures (including critical care time)  UC Diagnoses / Final Clinical Impressions(s)   I have reviewed the triage vital signs and the nursing notes.  Pertinent labs & imaging results that were available during my care of the patient were reviewed by me and considered in my medical decision making (see chart for details).   Final diagnoses:  Fever, unspecified fever cause  Viral illness  Laryngitis  Pharyngitis, unspecified etiology   Patient advised she likely has an upper respiratory infection that is viral in etiology.  Patient advised to rest, drink plenty of clear fluids take ibuprofen and Tylenol as needed, also welcome to continue cold and flu operations if they are helping at all.  COVID and flu tests repeated but will likely be negative.  ED  Prescriptions   None    PDMP not reviewed this encounter.  Pending results:  Labs Reviewed  COVID-19, FLU A+B NAA    Medications Ordered in UC: Medications - No data to display  Disposition Upon Discharge:  Condition: stable for discharge home Home: take medications as prescribed; routine discharge instructions as discussed; follow up as advised.  Patient presented with an acute illness with associated systemic symptoms and significant discomfort requiring urgent management. In my opinion, this is a condition that a prudent lay person (someone who  possesses an average knowledge of health and medicine) may potentially expect to result in complications if not addressed urgently such as respiratory distress, impairment of bodily function or dysfunction of bodily organs.   Routine symptom specific, illness specific and/or disease specific instructions were discussed with the patient and/or caregiver at length.   As such, the patient has been evaluated and assessed, work-up was performed and treatment was provided in alignment with urgent care protocols and evidence based medicine.  Patient/parent/caregiver has been advised that the patient may require follow up for further testing and treatment if the symptoms continue in spite of treatment, as clinically indicated and appropriate.  If the patient was tested for COVID-19, Influenza and/or RSV, then the patient/parent/guardian was advised to isolate at home pending the results of his/her diagnostic coronavirus test and potentially longer if they're positive. I have also advised pt that if his/her COVID-19 test returns positive, it's recommended to self-isolate for at least 10 days after symptoms first appeared AND until fever-free for 24 hours without fever reducer AND other symptoms have improved or resolved. Discussed self-isolation recommendations as well as instructions for household member/close contacts as per the [facility] and [place] DHHS, and also gave patient the [place] packet with this information.  Patient/parent/caregiver has been advised to return to the [facility] or PCP in 3-5 days if no better; to PCP or the Emergency Department if new signs and symptoms develop, or if the current signs or symptoms continue to change or worsen for further workup, evaluation and treatment as clinically indicated and appropriate  The patient will follow up with their current PCP if and as advised. If the patient does not currently have a PCP we will assist them in obtaining one.   The patient may need specialty follow up  if the symptoms continue, in spite of conservative treatment and management, for further workup, evaluation, consultation and treatment as clinically indicated and appropriate.  Patient/parent/caregiver verbalized understanding and agreement of plan as discussed.  All questions were addressed during visit.  Please see discharge instructions below for further details of plan.  Discharge Instructions:   Discharge Instructions      The results of your COVID/flu test will be posted to your MyChart once they are complete, this typically takes 24 to 48 hours, if there is a positive result you will be contacted by phone.  Conservative care is recommended at this time, this includes rest, hydrating fluids, bland diet, gentle activity.  You are also welcome to continue taking ibuprofen and Tylenol as needed.  Thank you for visiting urgent care today.    This office note has been dictated using Museum/gallery curator.  Unfortunately, and despite my best efforts, this method of dictation can sometimes lead to occasional typographical or grammatical errors.  I apologize in advance if this occurs.     Lynden Oxford Scales, [place] 11/07/21 [phone]

## 2021-11-06 NOTE — Discharge Instructions (Signed)
The results of your COVID/flu test will be posted to your MyChart once they are complete, this typically takes 24 to 48 hours, if there is a positive result you will be contacted by phone.  Conservative care is recommended at this time, this includes rest, hydrating fluids, bland diet, gentle activity.  You are also welcome to continue taking ibuprofen and Tylenol as needed.  Thank you for visiting urgent care today.

## 2021-11-06 NOTE — ED Triage Notes (Signed)
Pt presents with ongoing sore throat that has been unrelieved with OTC remedies and lozenges

## 2021-11-08 LAB — COVID-19, FLU A+B NAA
Influenza A, NAA: NOT DETECTED
Influenza B, NAA: NOT DETECTED
SARS-CoV-2, NAA: NOT DETECTED

## 2021-11-10 DIAGNOSIS — J301 Allergic rhinitis due to pollen: Secondary | ICD-10-CM | POA: Diagnosis not present

## 2021-11-17 DIAGNOSIS — J301 Allergic rhinitis due to pollen: Secondary | ICD-10-CM | POA: Diagnosis not present

## 2021-11-22 ENCOUNTER — Other Ambulatory Visit: Payer: Self-pay

## 2021-11-24 DIAGNOSIS — J301 Allergic rhinitis due to pollen: Secondary | ICD-10-CM | POA: Diagnosis not present

## 2021-12-01 DIAGNOSIS — J301 Allergic rhinitis due to pollen: Secondary | ICD-10-CM | POA: Diagnosis not present

## 2021-12-09 DIAGNOSIS — J301 Allergic rhinitis due to pollen: Secondary | ICD-10-CM | POA: Diagnosis not present

## 2021-12-22 DIAGNOSIS — J301 Allergic rhinitis due to pollen: Secondary | ICD-10-CM | POA: Diagnosis not present

## 2021-12-28 ENCOUNTER — Other Ambulatory Visit: Payer: Self-pay

## 2021-12-28 ENCOUNTER — Other Ambulatory Visit: Payer: Self-pay | Admitting: Family Medicine

## 2021-12-28 MED ORDER — VITAMIN B-12 1000 MCG PO TABS
1000.0000 ug | ORAL_TABLET | Freq: Every day | ORAL | 0 refills | Status: AC
  Filled 2021-12-28: qty 90, 90d supply, fill #0

## 2021-12-28 MED ORDER — CALCIUM 600+D PLUS MINERALS 600-400 MG-UNIT PO TABS
2.0000 | ORAL_TABLET | Freq: Every day | ORAL | 0 refills | Status: DC
  Filled 2021-12-28: qty 180, fill #0

## 2021-12-29 DIAGNOSIS — J301 Allergic rhinitis due to pollen: Secondary | ICD-10-CM | POA: Diagnosis not present

## 2022-01-05 DIAGNOSIS — J301 Allergic rhinitis due to pollen: Secondary | ICD-10-CM | POA: Diagnosis not present

## 2022-01-19 DIAGNOSIS — J301 Allergic rhinitis due to pollen: Secondary | ICD-10-CM | POA: Diagnosis not present

## 2022-01-26 ENCOUNTER — Other Ambulatory Visit: Payer: Self-pay

## 2022-01-26 DIAGNOSIS — J301 Allergic rhinitis due to pollen: Secondary | ICD-10-CM | POA: Diagnosis not present

## 2022-02-02 DIAGNOSIS — J301 Allergic rhinitis due to pollen: Secondary | ICD-10-CM | POA: Diagnosis not present

## 2022-02-09 DIAGNOSIS — J301 Allergic rhinitis due to pollen: Secondary | ICD-10-CM | POA: Diagnosis not present

## 2022-02-16 DIAGNOSIS — J301 Allergic rhinitis due to pollen: Secondary | ICD-10-CM | POA: Diagnosis not present

## 2022-02-23 DIAGNOSIS — J301 Allergic rhinitis due to pollen: Secondary | ICD-10-CM | POA: Diagnosis not present

## 2022-03-01 ENCOUNTER — Other Ambulatory Visit: Payer: Self-pay

## 2022-03-01 ENCOUNTER — Telehealth: Payer: Self-pay | Admitting: Family Medicine

## 2022-03-01 DIAGNOSIS — E7800 Pure hypercholesterolemia, unspecified: Secondary | ICD-10-CM

## 2022-03-01 DIAGNOSIS — Z1159 Encounter for screening for other viral diseases: Secondary | ICD-10-CM

## 2022-03-01 NOTE — Telephone Encounter (Signed)
-----   Message from [name] sent at 03/01/2022  9:38 AM EDT ----- Regarding: lab orders for Monday, 10.2.23 Patient is scheduled for CPX labs, please order future labs, Thanks , [name]

## 2022-03-02 DIAGNOSIS — J301 Allergic rhinitis due to pollen: Secondary | ICD-10-CM | POA: Diagnosis not present

## 2022-03-09 ENCOUNTER — Other Ambulatory Visit: Payer: 59

## 2022-03-10 DIAGNOSIS — J301 Allergic rhinitis due to pollen: Secondary | ICD-10-CM | POA: Diagnosis not present

## 2022-03-13 ENCOUNTER — Other Ambulatory Visit (INDEPENDENT_AMBULATORY_CARE_PROVIDER_SITE_OTHER): Payer: 59

## 2022-03-13 DIAGNOSIS — E7800 Pure hypercholesterolemia, unspecified: Secondary | ICD-10-CM | POA: Diagnosis not present

## 2022-03-13 DIAGNOSIS — Z1159 Encounter for screening for other viral diseases: Secondary | ICD-10-CM

## 2022-03-13 LAB — LIPID PANEL
Cholesterol: 227 mg/dL — ABNORMAL HIGH (ref 0–200)
HDL: 52.2 mg/dL (ref >39.00)
LDL Cholesterol: 136 mg/dL — ABNORMAL HIGH (ref 0–99)
NonHDL: 175.15
Total CHOL/HDL Ratio: 4
Triglycerides: 194 mg/dL — ABNORMAL HIGH (ref 0.0–149.0)
VLDL: 38.8 mg/dL (ref 0.0–40.0)

## 2022-03-13 LAB — COMPREHENSIVE METABOLIC PANEL
ALT: 20 U/L (ref 0–35)
AST: 19 U/L (ref 0–37)
Albumin: 4.2 g/dL (ref 3.5–5.2)
Alkaline Phosphatase: 86 U/L (ref 39–117)
BUN: 10 mg/dL (ref 6–23)
CO2: 30 mEq/L (ref 19–32)
Calcium: 9.6 mg/dL (ref 8.4–10.5)
Chloride: 102 mEq/L (ref 96–112)
Creatinine, Ser: 0.9 mg/dL (ref 0.40–1.20)
GFR: 71.16 mL/min (ref >60.00)
Glucose, Bld: 92 mg/dL (ref 70–99)
Potassium: 4.6 mEq/L (ref 3.5–5.1)
Sodium: 141 mEq/L (ref 135–145)
Total Bilirubin: 0.7 mg/dL (ref 0.2–1.2)
Total Protein: 7 g/dL (ref 6.0–8.3)

## 2022-03-14 LAB — HEPATITIS C ANTIBODY: Hepatitis C Ab: NONREACTIVE

## 2022-03-14 NOTE — Progress Notes (Signed)
No critical labs need to be addressed urgently. We will discuss labs in detail at upcoming office visit.   

## 2022-03-16 ENCOUNTER — Encounter: Payer: 59 | Admitting: Family Medicine

## 2022-03-16 DIAGNOSIS — J301 Allergic rhinitis due to pollen: Secondary | ICD-10-CM | POA: Diagnosis not present

## 2022-03-17 ENCOUNTER — Encounter: Payer: Self-pay | Admitting: Family Medicine

## 2022-03-17 ENCOUNTER — Ambulatory Visit (INDEPENDENT_AMBULATORY_CARE_PROVIDER_SITE_OTHER): Payer: 59 | Admitting: Family Medicine

## 2022-03-17 VITALS — BP 110/78 | HR 86 | Temp 98.0°F | Ht 66.0 in | Wt 169.2 lb

## 2022-03-17 DIAGNOSIS — E7800 Pure hypercholesterolemia, unspecified: Secondary | ICD-10-CM | POA: Diagnosis not present

## 2022-03-17 DIAGNOSIS — F411 Generalized anxiety disorder: Secondary | ICD-10-CM | POA: Diagnosis not present

## 2022-03-17 DIAGNOSIS — R911 Solitary pulmonary nodule: Secondary | ICD-10-CM

## 2022-03-17 DIAGNOSIS — Z0000 Encounter for general adult medical examination without abnormal findings: Secondary | ICD-10-CM

## 2022-03-17 DIAGNOSIS — R002 Palpitations: Secondary | ICD-10-CM

## 2022-03-17 NOTE — Assessment & Plan Note (Signed)
Chronic, stable moderate control She is working on stress reduction and relaxation.  Encouraged exercise to help with stress reduction.  She denies need for medication or counseling at this time.

## 2022-03-17 NOTE — Patient Instructions (Signed)
Consider coming  off metoprolol at beginning of year if stress decreased.  Work healthy eating, regular exercise ans Estate manager/land agent.

## 2022-03-17 NOTE — Assessment & Plan Note (Signed)
Chronic intermittent Cardiac evaluation done in 2023 Well-controlled on Toprol XL 25 mg daily, cardiology told her she could consider coming off of this.  Given her symptoms are closely related to her anxiety she will wait till a less stressful time of year and then consider stopping the Toprol-XL.

## 2022-03-17 NOTE — Assessment & Plan Note (Signed)
No further eval needed. 

## 2022-03-17 NOTE — Assessment & Plan Note (Signed)
Chronic, worsening control She has not been watching lifestyle habits.  She will get back on track with

## 2022-03-17 NOTE — Progress Notes (Signed)
Patient ID: Joyce Brock, female    DOB: 1966-04-15, 56 y.o.   MRN: [id number]  This visit was conducted in person.  BP 110/78   Pulse 86   Temp 98 F (36.7 C) (Oral)   Ht '5\' 6"'  (1.676 m)   Wt 169 lb 4 oz (76.8 kg)   LMP 03/23/2013   SpO2 96%   BMI 27.32 kg/m    CC:  Chief Complaint  Patient presents with   Annual Exam    Subjective:   HPI: Joyce Brock is a 56 y.o. female presenting on 03/17/2022 for Annual Exam   Feeling well overall. [place] [facility] Visit from 03/17/2022 in [place] at [facility] Total Score 0      Good energy.   Has moved... lived in [place] and ate out since 09/2021 Diet: eating out Exercise: minimal exercise Wt Readings from Last 3 Encounters:  03/17/22 169 lb 4 oz (76.8 kg)  08/19/21 158 lb 2 oz (71.7 kg)  06/23/21 163 lb 3.2 oz (74 kg)    Elevated Cholesterol:  On atorvastatin 10 mg  every other day Lab Results  Component Value Date   CHOL 227 (H) 03/13/2022   HDL 52.20 03/13/2022   LDLCALC 136 (H) 03/13/2022   LDLDIRECT 168.7 03/22/2012   TRIG 194.0 (H) 03/13/2022   CHOLHDL 4 03/13/2022  The 10-year ASCVD risk score (Arnett DK, et al., 2019) is: 2.1%   Values used to calculate the score:     Age: 52 years     Sex: Female     Is Non-Hispanic African American: No     Diabetic: No     Tobacco smoker: No     Systolic Blood Pressure: 664 mmHg     Is BP treated: No     HDL Cholesterol: 52.2 mg/dL     Total Cholesterol: 227 mg/dL Using medications without problems: none Muscle aches:  none Diet compliance: Exercise: Other complaints:   Palpitations: now on B Blocker per cardiology.. no spells of rapid heart rate in last several months.  Very closely related to anxiety.      Relevant past medical, surgical, family and social history reviewed and updated as indicated. Interim medical history since our last visit reviewed. Allergies and medications reviewed and updated. Outpatient Medications  Prior to Visit  Medication Sig Dispense Refill   acetaminophen (TYLENOL) 500 MG tablet Take 1,000 mg by mouth every 6 (six) hours as needed.     atorvastatin (LIPITOR) 10 MG tablet TAKE 1 TABLET BY MOUTH DAILY. 90 tablet 1   Calcium Carbonate-Vit D-Min (CALCIUM 600+D PLUS MINERALS) 600-400 MG-UNIT TABS Take 2 tablets by mouth daily. 180 tablet 0   cetirizine (ZYRTEC) 10 MG tablet Take 1 tablet by mouth once daily as needed 60 tablet 14   cimetidine (TAGAMET HB) 200 MG tablet Take 1 tablet (200 mg total) by mouth 2 (two) times daily. 60 tablet 0   EPINEPHrine (EPIPEN 2-PAK) 0.3 mg/0.3 mL IJ SOAJ injection Use as directed within package instructions. 2 each 0   EPINEPHrine 0.3 mg/0.3 mL IJ SOAJ injection Inject into the muscle.     hydrOXYzine (ATARAX/VISTARIL) 50 MG tablet Take 1 tablet (50 mg total) by mouth 3 (three) times daily as needed for anxiety or nausea. 30 tablet 0   metoprolol succinate (TOPROL XL) 25 MG 24 hr tablet Take 1 tablet (25 mg total) by mouth daily. 30 tablet 8   omeprazole (PRILOSEC OTC) 20 MG tablet  Take 20 mg by mouth daily.     vitamin B-12 (CYANOCOBALAMIN) 1000 MCG tablet Take 1 tablet (1,000 mcg total) by mouth daily. 90 tablet 0   fluticasone (FLONASE) 50 MCG/ACT nasal spray SPRAY TWO SPRAYS IN EACH NOSTRIL ONCE DAILY 48 g 12   pantoprazole (PROTONIX) 40 MG tablet Take 1 tablet (40 mg total) by mouth as needed. (Patient not taking: Reported on 08/19/2021) 30 tablet 1   No facility-administered medications prior to visit.     Per HPI unless specifically indicated in ROS section below Review of Systems  Constitutional:  Negative for fatigue and fever.  HENT:  Negative for congestion.   Eyes:  Negative for pain.  Respiratory:  Negative for cough and shortness of breath.   Cardiovascular:  Negative for chest pain, palpitations and leg swelling.  Gastrointestinal:  Negative for abdominal pain.  Genitourinary:  Negative for dysuria and vaginal bleeding.   Musculoskeletal:  Negative for back pain.  Neurological:  Negative for syncope, light-headedness and headaches.  Psychiatric/Behavioral:  Negative for dysphoric mood.    Objective:  BP 110/78   Pulse 86   Temp 98 F (36.7 C) (Oral)   Ht '5\' 6"'  (1.676 m)   Wt 169 lb 4 oz (76.8 kg)   LMP 03/23/2013   SpO2 96%   BMI 27.32 kg/m   Wt Readings from Last 3 Encounters:  03/17/22 169 lb 4 oz (76.8 kg)  08/19/21 158 lb 2 oz (71.7 kg)  06/23/21 163 lb 3.2 oz (74 kg)      Physical Exam Vitals and nursing note reviewed.  Constitutional:      General: She is not in acute distress.    Appearance: Normal appearance. She is well-developed. She is not ill-appearing or toxic-appearing.  HENT:     Head: Normocephalic.     Right Ear: Hearing, tympanic membrane, ear canal and external ear normal.     Left Ear: Hearing, tympanic membrane, ear canal and external ear normal.     Nose: Nose normal.  Eyes:     General: Lids are normal. Lids are everted, no foreign bodies appreciated.     Conjunctiva/sclera: Conjunctivae normal.     Pupils: Pupils are equal, round, and reactive to light.  Neck:     Thyroid: No thyroid mass or thyromegaly.     Vascular: No carotid bruit.     Trachea: Trachea normal.  Cardiovascular:     Rate and Rhythm: Normal rate and regular rhythm.     Heart sounds: Normal heart sounds, S1 normal and S2 normal. No murmur heard.    No gallop.  Pulmonary:     Effort: Pulmonary effort is normal. No respiratory distress.     Breath sounds: Normal breath sounds. No wheezing, rhonchi or rales.  Abdominal:     General: Bowel sounds are normal. There is no distension or abdominal bruit.     Palpations: Abdomen is soft. There is no fluid wave or mass.     Tenderness: There is no abdominal tenderness. There is no guarding or rebound.     Hernia: No hernia is present.  Musculoskeletal:     Cervical back: Normal range of motion and neck supple.  Lymphadenopathy:     Cervical: No  cervical adenopathy.  Skin:    General: Skin is warm and dry.     Findings: No rash.  Neurological:     Mental Status: She is alert.     Cranial Nerves: No cranial nerve deficit.  Sensory: No sensory deficit.  Psychiatric:        Mood and Affect: Mood is not anxious or depressed.        Speech: Speech normal.        Behavior: Behavior normal. Behavior is cooperative.        Judgment: Judgment normal.       Results for orders placed or performed in visit on 03/13/22  Comprehensive metabolic panel  Result Value Ref Range   Sodium 141 135 - 145 mEq/L   Potassium 4.6 3.5 - 5.1 mEq/L   Chloride 102 96 - 112 mEq/L   CO2 30 19 - 32 mEq/L   Glucose, Bld 92 70 - 99 mg/dL   BUN 10 6 - 23 mg/dL   Creatinine, Ser 0.90 0.40 - 1.20 mg/dL   Total Bilirubin 0.7 0.2 - 1.2 mg/dL   Alkaline Phosphatase 86 39 - 117 U/L   AST 19 0 - 37 U/L   ALT 20 0 - 35 U/L   Total Protein 7.0 6.0 - 8.3 g/dL   Albumin 4.2 3.5 - 5.2 g/dL   GFR 71.16 >60.00 mL/min   Calcium 9.6 8.4 - 10.5 mg/dL  Lipid panel  Result Value Ref Range   Cholesterol 227 (H) 0 - 200 mg/dL   Triglycerides 194.0 (H) 0.0 - 149.0 mg/dL   HDL 52.20 >39.00 mg/dL   VLDL 38.8 0.0 - 40.0 mg/dL   LDL Cholesterol 136 (H) 0 - 99 mg/dL   Total CHOL/HDL Ratio 4    NonHDL 175.15   Hepatitis C antibody  Result Value Ref Range   Hepatitis C Ab NON-REACTIVE NON-REACTIVE     COVID 19 screen:  No recent travel or known exposure to COVID19 The patient denies respiratory symptoms of COVID 19 at this time. The importance of social distancing was discussed today.   Assessment and Plan   The patient's preventative maintenance and recommended screening tests for an annual wellness exam were reviewed in full today. Brought up to date unless services declined.  Counselled on the importance of diet, exercise, and its role in overall health and mortality. The patient's FH and SH was reviewed, including their home life, tobacco status, and drug  and alcohol status.   Vaccines: Uptodate COVID series x 3 ,  Tdap, consider shingrix, will get  flu at work  Mammo: 04/2021 nml, sister with breast cancer. BRCA1 and BRCA 2 neg.  Smoking:no  STD screen/ HIV : refused.  PAP/DVE: 02/2020 normal with neg HPV Colon: colonoscopy 10.2019 repeat in 5 years.  Hep C:  neg  ETOH: none  Problem List Items Addressed This Visit     GAD (generalized anxiety disorder)    Chronic, stable moderate control She is working on stress reduction and relaxation.  Encouraged exercise to help with stress reduction.  She denies need for medication or counseling at this time.      HYPERCHOLESTEROLEMIA    Chronic, worsening control She has not been watching lifestyle habits.  She will get back on track with      Palpitations    Chronic intermittent Cardiac evaluation done in 2023 Well-controlled on Toprol XL 25 mg daily, cardiology told her she could consider coming off of this.  Given her symptoms are closely related to her anxiety she will wait till a less stressful time of year and then consider stopping the Toprol-XL.      Pulmonary nodule    No further eval needed.      Other  Visit Diagnoses     Routine general medical examination at a health care facility    -  Primary       Eliezer Lofts, MD

## 2022-03-23 DIAGNOSIS — J301 Allergic rhinitis due to pollen: Secondary | ICD-10-CM | POA: Diagnosis not present

## 2022-03-30 DIAGNOSIS — J301 Allergic rhinitis due to pollen: Secondary | ICD-10-CM | POA: Diagnosis not present

## 2022-04-05 ENCOUNTER — Other Ambulatory Visit: Payer: Self-pay

## 2022-04-05 DIAGNOSIS — J309 Allergic rhinitis, unspecified: Secondary | ICD-10-CM | POA: Diagnosis not present

## 2022-04-05 DIAGNOSIS — K121 Other forms of stomatitis: Secondary | ICD-10-CM | POA: Diagnosis not present

## 2022-04-05 MED ORDER — CETIRIZINE HCL 10 MG PO TABS
10.0000 mg | ORAL_TABLET | Freq: Every day | ORAL | 10 refills | Status: DC | PRN
  Filled 2022-04-10 – 2022-06-09 (×2): qty 90, 90d supply, fill #0

## 2022-04-05 MED ORDER — NYSTATIN 100000 UNIT/ML MT SUSP
OROMUCOSAL | 4 refills | Status: DC
  Filled 2022-04-05: qty 300, 10d supply, fill #0
  Filled 2022-06-09: qty 300, 10d supply, fill #1

## 2022-04-05 MED ORDER — FLUTICASONE PROPIONATE 50 MCG/ACT NA SUSP
NASAL | 12 refills | Status: DC
  Filled 2022-04-05: qty 16, 30d supply, fill #0
  Filled 2022-09-06: qty 16, 30d supply, fill #1
  Filled 2022-12-04: qty 16, 30d supply, fill #2
  Filled 2023-02-25: qty 16, 30d supply, fill #3

## 2022-04-10 ENCOUNTER — Other Ambulatory Visit: Payer: Self-pay

## 2022-04-13 DIAGNOSIS — J301 Allergic rhinitis due to pollen: Secondary | ICD-10-CM | POA: Diagnosis not present

## 2022-04-20 DIAGNOSIS — Z1231 Encounter for screening mammogram for malignant neoplasm of breast: Secondary | ICD-10-CM | POA: Diagnosis not present

## 2022-04-20 DIAGNOSIS — J301 Allergic rhinitis due to pollen: Secondary | ICD-10-CM | POA: Diagnosis not present

## 2022-04-20 LAB — HM MAMMOGRAPHY

## 2022-04-21 ENCOUNTER — Encounter: Payer: Self-pay | Admitting: Family Medicine

## 2022-04-27 DIAGNOSIS — J301 Allergic rhinitis due to pollen: Secondary | ICD-10-CM | POA: Diagnosis not present

## 2022-05-08 ENCOUNTER — Other Ambulatory Visit: Payer: Self-pay

## 2022-05-11 ENCOUNTER — Encounter: Payer: Self-pay | Admitting: Family Medicine

## 2022-05-11 ENCOUNTER — Other Ambulatory Visit: Payer: Self-pay

## 2022-05-11 ENCOUNTER — Ambulatory Visit: Payer: 59 | Admitting: Family Medicine

## 2022-05-11 VITALS — BP 118/76 | HR 80 | Temp 97.2°F | Ht 66.0 in | Wt 167.8 lb

## 2022-05-11 DIAGNOSIS — R202 Paresthesia of skin: Secondary | ICD-10-CM | POA: Diagnosis not present

## 2022-05-11 DIAGNOSIS — F411 Generalized anxiety disorder: Secondary | ICD-10-CM | POA: Diagnosis not present

## 2022-05-11 DIAGNOSIS — J301 Allergic rhinitis due to pollen: Secondary | ICD-10-CM | POA: Diagnosis not present

## 2022-05-11 LAB — COMPREHENSIVE METABOLIC PANEL
ALT: 16 U/L (ref 0–35)
AST: 14 U/L (ref 0–37)
Albumin: 4.5 g/dL (ref 3.5–5.2)
Alkaline Phosphatase: 80 U/L (ref 39–117)
BUN: 10 mg/dL (ref 6–23)
CO2: 32 mEq/L (ref 19–32)
Calcium: 10 mg/dL (ref 8.4–10.5)
Chloride: 103 mEq/L (ref 96–112)
Creatinine, Ser: 0.83 mg/dL (ref 0.40–1.20)
GFR: 78.33 mL/min (ref >60.00)
Glucose, Bld: 87 mg/dL (ref 70–99)
Potassium: 4.5 mEq/L (ref 3.5–5.1)
Sodium: 142 mEq/L (ref 135–145)
Total Bilirubin: 0.4 mg/dL (ref 0.2–1.2)
Total Protein: 7.3 g/dL (ref 6.0–8.3)

## 2022-05-11 LAB — CBC WITH DIFFERENTIAL/PLATELET
Basophils Absolute: 0 10*3/uL (ref 0.0–0.1)
Basophils Relative: 0.8 % (ref 0.0–3.0)
Eosinophils Absolute: 0.2 10*3/uL (ref 0.0–0.7)
Eosinophils Relative: 2.6 % (ref 0.0–5.0)
HCT: 43.1 % (ref 36.0–46.0)
Hemoglobin: 14.6 g/dL (ref 12.0–15.0)
Lymphocytes Relative: 33.6 % (ref 12.0–46.0)
Lymphs Abs: 2 10*3/uL (ref 0.7–4.0)
MCHC: 34 g/dL (ref 30.0–36.0)
MCV: 85.9 fl (ref 78.0–100.0)
Monocytes Absolute: 0.4 10*3/uL (ref 0.1–1.0)
Monocytes Relative: 6 % (ref 3.0–12.0)
Neutro Abs: 3.3 10*3/uL (ref 1.4–7.7)
Neutrophils Relative %: 57 % (ref 43.0–77.0)
Platelets: 193 10*3/uL (ref 150.0–400.0)
RBC: 5.01 Mil/uL (ref 3.87–5.11)
RDW: 13.4 % (ref 11.5–15.5)
WBC: 5.9 10*3/uL (ref 4.0–10.5)

## 2022-05-11 LAB — VITAMIN B12: Vitamin B-12: 997 pg/mL — ABNORMAL HIGH (ref 211–911)

## 2022-05-11 LAB — TSH: TSH: 1.54 u[IU]/mL (ref 0.35–5.50)

## 2022-05-11 MED ORDER — SERTRALINE HCL 50 MG PO TABS
50.0000 mg | ORAL_TABLET | Freq: Every day | ORAL | 3 refills | Status: DC
  Filled 2022-05-11: qty 30, 30d supply, fill #0

## 2022-05-11 NOTE — Progress Notes (Signed)
Patient ID: Joyce Brock, female    DOB: 1965-09-06, 56 y.o.   MRN: [id number]  This visit was conducted in person.  BP 118/76   Pulse 80   Temp (!) 97.2 F (36.2 C) (Temporal)   Ht '5\' 6"'$  (1.676 m)   Wt 167 lb 12.8 oz (76.1 kg)   LMP 03/23/2013   SpO2 97%   BMI 27.08 kg/m    CC:  Chief Complaint  Patient presents with   Tingling    C/o tingling and weakness in B hands/legs/feet.  Also, sensitivity to cold and some chest tightness.  Thinks sxs are anxiety related and wants to discuss routine meds.     Subjective:   HPI: Joyce Brock is a 56 y.o. female presenting on 05/11/2022 for Tingling (C/o tingling and weakness in B hands/legs/feet.  Also, sensitivity to cold and some chest tightness.  Thinks sxs are anxiety related and wants to discuss routine meds. )   In the last week, intermittently She reports new onset tingling and weakness in bilateral legs hands and feet.  and occasional chest tight noted colder sensations in body but did not feel cold.  Has noted some chest tightness.  Mood starts racing when she is having symptoms.. feels 'doom and gloom".   She has been under more stress lately... recently moved, holidays, office being renovated.  Some trouble sleeping.    Hx of panic attacks.. referred to counseling in past. Has use atarax prn. ( In past when she used this it made her lightheaded, passed out)   She has been working on trying to exercise and decrease stress.  EKG, ECHO  reviewed from 2021.   Relevant past medical, surgical, family and social history reviewed and updated as indicated. Interim medical history since our last visit reviewed. Allergies and medications reviewed and updated. Outpatient Medications Prior to Visit  Medication Sig Dispense Refill   acetaminophen (TYLENOL) 500 MG tablet Take 1,000 mg by mouth every 6 (six) hours as needed.     atorvastatin (LIPITOR) 10 MG tablet TAKE 1 TABLET BY MOUTH DAILY. 90 tablet 1   Calcium  Carbonate-Vit D-Min (CALCIUM 600+D PLUS MINERALS) 600-400 MG-UNIT TABS Take 2 tablets by mouth daily. 180 tablet 0   cetirizine (ZYRTEC ALLERGY) 10 MG tablet Take 1 pill once daily as needed 90 tablet 10   cetirizine (ZYRTEC) 10 MG tablet Take 1 tablet by mouth once daily as needed 60 tablet 14   EPINEPHrine (EPIPEN 2-PAK) 0.3 mg/0.3 mL IJ SOAJ injection Use as directed within package instructions. 2 each 0   EPINEPHrine 0.3 mg/0.3 mL IJ SOAJ injection Inject into the muscle.     fluticasone (FLONASE) 50 MCG/ACT nasal spray Use 2 sprays in each nostril once daily 16 g 12   magic mouthwash (nystatin, hydrocortisone, diphenhydrAMINE) suspension Swish , gargle and spit 10 ml by mouth 3 times a day for 10 days 300 mL 4   metoprolol succinate (TOPROL XL) 25 MG 24 hr tablet Take 1 tablet (25 mg total) by mouth daily. 30 tablet 8   omeprazole (PRILOSEC OTC) 20 MG tablet Take 20 mg by mouth daily.     vitamin B-12 (CYANOCOBALAMIN) 1000 MCG tablet Take 1 tablet (1,000 mcg total) by mouth daily. 90 tablet 0   fluticasone (FLONASE) 50 MCG/ACT nasal spray SPRAY TWO SPRAYS IN EACH NOSTRIL ONCE DAILY 48 g 12   cimetidine (TAGAMET HB) 200 MG tablet Take 1 tablet (200 mg total) by mouth 2 (two) times  daily. 60 tablet 0   hydrOXYzine (ATARAX/VISTARIL) 50 MG tablet Take 1 tablet (50 mg total) by mouth 3 (three) times daily as needed for anxiety or nausea. 30 tablet 0   No facility-administered medications prior to visit.     Per HPI unless specifically indicated in ROS section below Review of Systems  Constitutional:  Negative for fatigue and fever.  HENT:  Negative for congestion.   Eyes:  Negative for pain.  Respiratory:  Negative for cough and shortness of breath.   Cardiovascular:  Negative for chest pain, palpitations and leg swelling.  Gastrointestinal:  Negative for abdominal pain.  Genitourinary:  Negative for dysuria and vaginal bleeding.  Musculoskeletal:  Negative for back pain.  Neurological:   Negative for syncope, light-headedness and headaches.  Psychiatric/Behavioral:  Negative for dysphoric mood.    Objective:  BP 118/76   Pulse 80   Temp (!) 97.2 F (36.2 C) (Temporal)   Ht '5\' 6"'$  (1.676 m)   Wt 167 lb 12.8 oz (76.1 kg)   LMP 03/23/2013   SpO2 97%   BMI 27.08 kg/m   Wt Readings from Last 3 Encounters:  05/11/22 167 lb 12.8 oz (76.1 kg)  03/17/22 169 lb 4 oz (76.8 kg)  08/19/21 158 lb 2 oz (71.7 kg)      Physical Exam Constitutional:      General: She is not in acute distress.    Appearance: Normal appearance. She is well-developed. She is not ill-appearing or toxic-appearing.  HENT:     Head: Normocephalic.     Right Ear: Hearing, tympanic membrane, ear canal and external ear normal. Tympanic membrane is not erythematous, retracted or bulging.     Left Ear: Hearing, tympanic membrane, ear canal and external ear normal. Tympanic membrane is not erythematous, retracted or bulging.     Nose: No mucosal edema or rhinorrhea.     Right Sinus: No maxillary sinus tenderness or frontal sinus tenderness.     Left Sinus: No maxillary sinus tenderness or frontal sinus tenderness.     Mouth/Throat:     Pharynx: Uvula midline.  Eyes:     General: Lids are normal. Lids are everted, no foreign bodies appreciated.     Conjunctiva/sclera: Conjunctivae normal.     Pupils: Pupils are equal, round, and reactive to light.  Neck:     Thyroid: No thyroid mass or thyromegaly.     Vascular: No carotid bruit.     Trachea: Trachea normal.  Cardiovascular:     Rate and Rhythm: Normal rate and regular rhythm.     Pulses: Normal pulses.     Heart sounds: Normal heart sounds, S1 normal and S2 normal. No murmur heard.    No friction rub. No gallop.  Pulmonary:     Effort: Pulmonary effort is normal. No tachypnea or respiratory distress.     Breath sounds: Normal breath sounds. No decreased breath sounds, wheezing, rhonchi or rales.  Abdominal:     General: Bowel sounds are normal.      Palpations: Abdomen is soft.     Tenderness: There is no abdominal tenderness.  Musculoskeletal:     Cervical back: Normal range of motion and neck supple.  Skin:    General: Skin is warm and dry.     Findings: No rash.  Neurological:     Mental Status: She is alert.  Psychiatric:        Mood and Affect: Mood is not anxious or depressed.  Speech: Speech normal.        Behavior: Behavior normal. Behavior is cooperative.        Thought Content: Thought content normal.        Judgment: Judgment normal.       Results for orders placed or performed in visit on 04/21/22  HM MAMMOGRAPHY  Result Value Ref Range   HM Mammogram 0-4 Bi-Rad 0-4 Bi-Rad, Self Reported Normal     COVID 19 screen:  No recent travel or known exposure to [place] The patient denies respiratory symptoms of COVID 19 at this time. The importance of social distancing was discussed today.   Assessment and Plan    Problem List Items Addressed This Visit     GAD (generalized anxiety disorder) - Primary    Chronic, recurrent Side effects to Atarax in the past, did well on Paxil years ago. Will start sertraline 50 mg p.o. nightly.   Reviewed medication profile, side effects and expected course. She will follow-up in 4 weeks for reevaluation      Relevant Medications   sertraline (ZOLOFT) 50 MG tablet   Tingling in extremities    Acute, will evaluate with labs but most likely secondary to increased stress and anxiety.      Relevant Orders   Comprehensive metabolic panel   CBC with Differential/Platelet   Vitamin B12   TSH   Meds ordered this encounter  Medications   sertraline (ZOLOFT) 50 MG tablet    Sig: Take 1 tablet (50 mg total) by mouth daily.    Dispense:  30 tablet    Refill:  3     Eliezer Lofts, MD

## 2022-05-11 NOTE — Assessment & Plan Note (Signed)
Chronic, recurrent Side effects to Atarax in the past, did well on Paxil years ago. Will start sertraline 50 mg p.o. nightly.   Reviewed medication profile, side effects and expected course. She will follow-up in 4 weeks for reevaluation

## 2022-05-11 NOTE — Patient Instructions (Signed)
Please stop at the lab to have labs drawn.  Start sertraline at bedtime.

## 2022-05-11 NOTE — Assessment & Plan Note (Signed)
Acute, will evaluate with labs but most likely secondary to increased stress and anxiety.

## 2022-05-15 ENCOUNTER — Encounter: Payer: Self-pay | Admitting: Family Medicine

## 2022-05-24 DIAGNOSIS — J301 Allergic rhinitis due to pollen: Secondary | ICD-10-CM | POA: Diagnosis not present

## 2022-06-01 DIAGNOSIS — J301 Allergic rhinitis due to pollen: Secondary | ICD-10-CM | POA: Diagnosis not present

## 2022-06-02 DIAGNOSIS — J301 Allergic rhinitis due to pollen: Secondary | ICD-10-CM | POA: Diagnosis not present

## 2022-06-08 DIAGNOSIS — J301 Allergic rhinitis due to pollen: Secondary | ICD-10-CM | POA: Diagnosis not present

## 2022-06-09 ENCOUNTER — Other Ambulatory Visit: Payer: Self-pay

## 2022-06-09 MED ORDER — METOPROLOL SUCCINATE ER 25 MG PO TB24
25.0000 mg | ORAL_TABLET | Freq: Every day | ORAL | 0 refills | Status: DC
  Filled 2022-06-09: qty 30, 30d supply, fill #0

## 2022-06-13 ENCOUNTER — Ambulatory Visit (INDEPENDENT_AMBULATORY_CARE_PROVIDER_SITE_OTHER): Payer: Commercial Managed Care - PPO | Admitting: Dermatology

## 2022-06-13 ENCOUNTER — Ambulatory Visit: Payer: Self-pay | Admitting: Family Medicine

## 2022-06-13 ENCOUNTER — Other Ambulatory Visit: Payer: Self-pay

## 2022-06-13 ENCOUNTER — Encounter: Payer: Self-pay | Admitting: Dermatology

## 2022-06-13 VITALS — BP 116/72 | HR 76

## 2022-06-13 DIAGNOSIS — L821 Other seborrheic keratosis: Secondary | ICD-10-CM

## 2022-06-13 DIAGNOSIS — L814 Other melanin hyperpigmentation: Secondary | ICD-10-CM

## 2022-06-13 DIAGNOSIS — L209 Atopic dermatitis, unspecified: Secondary | ICD-10-CM

## 2022-06-13 DIAGNOSIS — L738 Other specified follicular disorders: Secondary | ICD-10-CM

## 2022-06-13 MED ORDER — PIMECROLIMUS 1 % EX CREA
TOPICAL_CREAM | CUTANEOUS | 3 refills | Status: AC
  Filled 2022-06-13: qty 30, 30d supply, fill #0

## 2022-06-13 NOTE — Patient Instructions (Addendum)
Start Pimecrolimus cream twice daily to eyelids at first sign of symptoms, use until clear.     Recommend CeraVe Eye Makeup Remover or CeraVe Micellar Cleansing Water.    Recommend daily broad spectrum sunscreen SPF 30+ to sun-exposed areas, reapply every 2 hours as needed. Call for new or changing lesions.  Staying in the shade or wearing long sleeves, sun glasses (UVA+UVB protection) and wide brim hats (4-inch brim around the entire circumference of the hat) are also recommended for sun protection.    Due to recent changes in healthcare laws, you may see results of your pathology and/or laboratory studies on MyChart before the doctors have had a chance to review them. We understand that in some cases there may be results that are confusing or concerning to you. Please understand that not all results are received at the same time and often the doctors may need to interpret multiple results in order to provide you with the best plan of care or course of treatment. Therefore, we ask that you please give [place] 2 business days to thoroughly review all your results before contacting the office for clarification. Should we see a critical lab result, you will be contacted sooner.   If You Need Anything After Your Visit  If you have any questions or concerns for your doctor, please call our main line at [phone] and press option 4 to reach your doctor's medical assistant. If no one answers, please leave a voicemail as directed and we will return your call as soon as possible. Messages left after 4 pm will be answered the following business day.   You may also send [place] a message via [place]. We typically respond to MyChart messages within 1-2 business days.  For prescription refills, please ask your pharmacy to contact our office. Our fax number is [phone].  If you have an urgent issue when the clinic is closed that cannot wait until the next business day, you can page your doctor at the number  below.    Please note that while we do our best to be available for urgent issues outside of office hours, we are not available 24/7.   If you have an urgent issue and are unable to reach [place], you may choose to seek medical care at your doctor's office, retail clinic, urgent care center, or emergency room.  If you have a medical emergency, please immediately call 911 or go to the emergency department.  Pager Numbers  - Dr. Nehemiah Massed: [phone]  - Dr. Laurence Ferrari: [phone]  - Dr. Nicole Kindred: [phone]  In the event of inclement weather, please call our main line at [phone] for an update on the status of any delays or closures.  Dermatology Medication Tips: Please keep the boxes that topical medications come in in order to help keep track of the instructions about where and how to use these. Pharmacies typically print the medication instructions only on the boxes and not directly on the medication tubes.   If your medication is too expensive, please contact our office at [phone] option 4 or send [place] a message through [place].   We are unable to tell what your co-pay for medications will be in advance as this is different depending on your insurance coverage. However, we may be able to find a substitute medication at lower cost or fill out paperwork to get insurance to cover a needed medication.   If a prior authorization is required to get your medication covered by your insurance company, please  allow [place] 1-2 business days to complete this process.  Drug prices often vary depending on where the prescription is filled and some pharmacies may offer cheaper prices.  The website www.goodrx.com contains coupons for medications through different pharmacies. The prices here do not account for what the cost may be with help from insurance (it may be cheaper with your insurance), but the website can give you the price if you did not use any insurance.  - You can print the associated coupon  and take it with your prescription to the pharmacy.  - You may also stop by our office during regular business hours and pick up a GoodRx coupon card.  - If you need your prescription sent electronically to a different pharmacy, notify our office through [facility] or by phone at [phone] option 4.     Si Usted Necesita Algo Despus de Su Visita  Tambin puede enviarnos un mensaje a travs de Pharmacist, community. Por lo general respondemos a los mensajes de MyChart en el transcurso de 1 a 2 das hbiles.  Para renovar recetas, por favor pida a su farmacia que se ponga en contacto con nuestra oficina. Harland Dingwall de fax es [place] [phone].  Si tiene un asunto urgente cuando la clnica est cerrada y que no puede esperar hasta el siguiente da hbil, puede llamar/localizar a su doctor(a) al nmero que aparece a continuacin.   Por favor, tenga en cuenta que aunque hacemos todo lo posible para estar disponibles para asuntos urgentes fuera del horario de [place], no estamos disponibles las 24 horas del da, los 7 das de la [place].   Si tiene un problema urgente y no puede comunicarse con nosotros, puede optar por buscar atencin mdica  en el consultorio de su doctor(a), en una clnica privada, en un centro de atencin urgente o en una sala de emergencias.  Si tiene Engineering geologist, por favor llame inmediatamente al 911 o vaya a la sala de emergencias.  Nmeros de bper  - Dr. Nehemiah Massed: [phone]  - Dra. Moye: [phone]  - Dra. Nicole Kindred: [phone]  En caso de inclemencias del [place], por favor llame a Johnsie Kindred principal al [phone] para una actualizacin sobre el [place] de cualquier retraso o cierre.  Consejos para la medicacin en dermatologa: Por favor, guarde las cajas en las que vienen los medicamentos de uso tpico para ayudarle a seguir las instrucciones sobre dnde y cmo usarlos. Las farmacias generalmente imprimen las instrucciones del medicamento  slo en las cajas y no directamente en los tubos del [place].   Si su medicamento es muy caro, por favor, pngase en contacto con Zigmund Daniel llamando al [phone] y presione la opcin 4 o envenos un mensaje a travs de Pharmacist, community.   No podemos decirle cul ser su copago por los medicamentos por adelantado ya que esto es diferente dependiendo de la cobertura de su seguro. Sin embargo, es posible que podamos encontrar un medicamento sustituto a Electrical engineer un formulario para que el seguro cubra el medicamento que se considera necesario.   Si se requiere una autorizacin previa para que su compaa de seguros [place] su medicamento, por favor permtanos de 1 a 2 das hbiles para completar este proceso.  Los precios de los medicamentos varan con frecuencia dependiendo del Environmental consultant de dnde se surte la receta y alguna farmacias pueden ofrecer precios ms baratos.  El sitio web www.goodrx.com tiene cupones para medicamentos de Airline pilot. Los precios aqu no tienen en cuenta lo que  lo que podra costar con la ayuda del seguro (puede ser ms barato con su seguro), pero el sitio web puede darle el precio si no utiliz ningn seguro.  - Puede imprimir el cupn correspondiente y llevarlo con su receta a la farmacia.  - Tambin puede pasar por nuestra oficina durante el horario de atencin regular y recoger una tarjeta de cupones de GoodRx.  - Si necesita que su receta se enve electrnicamente a una farmacia diferente, informe a nuestra oficina a travs de MyChart de [place] o por telfono llamando al [phone] y presione la opcin 4.  

## 2022-06-13 NOTE — Progress Notes (Signed)
   New Patient Visit  Subjective  Joyce Brock is a 57 y.o. female who presents for the following: Rash (Patient C/O psoriasis breakouts. Over the past year has had flares at eyelids. Has not used any medications on eye lids. Does not wear eye shadow. Has had issues on scalp and ears ~5 years. Had been seeing Dr. Tonia Brooms in [place] until she retired ).  She has a h/o of eczema as a child.    Review of Systems: No other skin or systemic complaints except as noted in HPI or Assessment and Plan.   Objective  Well appearing patient in no apparent distress; mood and affect are within normal limits.  A focused examination was performed including head, including the scalp, face, neck, nose, ears, eyelids, and lips. Relevant physical exam findings are noted in the Assessment and Plan.  Eyelids, periorbital Xerosis at upper eyelids and right infra ocular    Assessment & Plan  Atopic dermatitis, unspecified type Eyelids, periorbital  Chronic and persistent condition with duration or expected duration over one year. Condition is symptomatic/ bothersome to patient. Not currently at goal.   Atopic dermatitis (eczema) is a chronic, relapsing, pruritic condition that can significantly affect quality of life. It is often associated with allergic rhinitis and/or asthma and can require treatment with topical medications, phototherapy, or in severe cases biologic injectable medication (Dupixent; Adbry) or Oral JAK inhibitors.   Start Pimecrolimus cream twice daily to eyelids at first sign of symptoms, use until clear.  May burn with initial use.   Use CeraVe Micellar water to remove eye makeup, and Vanicream ointment to eyelids qhs  pimecrolimus (ELIDEL) 1 % cream - Eyelids, periorbital Apply twice daily to eyelids at first sign of symptoms. Use until clear.   Sebaceous Hyperplasia - Small yellow papules with a central dell face - Benign - Observe  Lentigines - Scattered tan macules  face - Due to sun exposure - Benign-appearing, observe - Recommend daily broad spectrum sunscreen SPF 30+ to sun-exposed areas, reapply every 2 hours as needed. - Call for any changes  Seborrheic Keratoses - Stuck-on, waxy, tan-brown papules and/or plaques  - Benign-appearing - Discussed benign etiology and prognosis. - Observe - Call for any changes    Return if symptoms worsen or fail to improve.  I, Emelia Salisbury, CMA, am acting as scribe for Brendolyn Patty, MD.  Documentation: I have reviewed the above documentation for accuracy and completeness, and I agree with the above.  Brendolyn Patty MD

## 2022-06-14 ENCOUNTER — Other Ambulatory Visit: Payer: Self-pay

## 2022-06-15 DIAGNOSIS — J301 Allergic rhinitis due to pollen: Secondary | ICD-10-CM | POA: Diagnosis not present

## 2022-06-22 ENCOUNTER — Encounter: Payer: Self-pay | Admitting: Cardiology

## 2022-06-22 DIAGNOSIS — J301 Allergic rhinitis due to pollen: Secondary | ICD-10-CM | POA: Diagnosis not present

## 2022-06-23 ENCOUNTER — Other Ambulatory Visit: Payer: Self-pay

## 2022-06-26 ENCOUNTER — Other Ambulatory Visit: Payer: Self-pay

## 2022-06-26 ENCOUNTER — Encounter: Payer: Self-pay | Admitting: Cardiology

## 2022-06-26 ENCOUNTER — Ambulatory Visit: Payer: 59 | Attending: Cardiology | Admitting: Cardiology

## 2022-06-26 ENCOUNTER — Other Ambulatory Visit
Admission: RE | Admit: 2022-06-26 | Discharge: 2022-06-26 | Disposition: A | Discharge status: Home / Self Care | Payer: 59 | Source: Ambulatory Visit | Attending: Cardiology | Admitting: Cardiology

## 2022-06-26 VITALS — BP 110/80 | HR 68 | Ht 66.0 in | Wt 165.1 lb

## 2022-06-26 DIAGNOSIS — R079 Chest pain, unspecified: Secondary | ICD-10-CM | POA: Diagnosis not present

## 2022-06-26 DIAGNOSIS — R000 Tachycardia, unspecified: Secondary | ICD-10-CM

## 2022-06-26 DIAGNOSIS — R072 Precordial pain: Secondary | ICD-10-CM

## 2022-06-26 DIAGNOSIS — E7800 Pure hypercholesterolemia, unspecified: Secondary | ICD-10-CM | POA: Insufficient documentation

## 2022-06-26 LAB — BASIC METABOLIC PANEL
Anion gap: 8 (ref 5–15)
BUN: 9 mg/dL (ref 6–20)
CO2: 27 mmol/L (ref 22–32)
Calcium: 9.4 mg/dL (ref 8.9–10.3)
Chloride: 105 mmol/L (ref 98–111)
Creatinine, Ser: 0.8 mg/dL (ref 0.44–1.00)
GFR, Estimated: >60 mL/min (ref >60)
Glucose, Bld: 96 mg/dL (ref 70–99)
Potassium: 4.1 mmol/L (ref 3.5–5.1)
Sodium: 140 mmol/L (ref 135–145)

## 2022-06-26 MED ORDER — METOPROLOL TARTRATE 100 MG PO TABS
ORAL_TABLET | ORAL | 0 refills | Status: DC
  Filled 2022-06-26: qty 1, 1d supply, fill #0

## 2022-06-26 NOTE — Progress Notes (Signed)
Cardiology Office Note:    Date:  06/26/2022   ID:  Joyce Brock, DOB 1965-10-03, MRN [id number]  PCP:  Jinny Sanders, MD   [facility] Providers Cardiologist:  Kate Sable, MD     Referring MD: Jinny Sanders, MD   Chief Complaint  Patient presents with   Other    12 month f/u c/o intense chest pain. Meds reviewed verbally with pt.    History of Present Illness:    Joyce Brock is a 57 y.o. female with a hx of GERD, anxiety, hyperlipidemia who presents due to chest pain.  Chest pain began 2 months ago, not associated with exertion.  Patient thought this might be secondary to anxiety, she took Zoloft prescribed by PCP for about a week but did not tolerate medication and stopped.  She has had occasional chest pain since then, not sure if this is cardiac related, GERD or anxiety.  Previous echo showed normal systolic function.  Tachycardia symptoms are well-controlled with Toprol-XL.   Prior notes/studies Echo 01/2020 showed normal systolic function, no wall motion abnormalities, EF 60 to 65%. Cardiac monitor 02/2020 occasional PACs and PVCs, no significant arrhythmias.  Past Medical History:  Diagnosis Date   Allergy    Anxiety    GERD (gastroesophageal reflux disease)    Hyperlipidemia    Migraines     Past Surgical History:  Procedure Laterality Date   CHOLECYSTECTOMY      Current Medications: Current Meds  Medication Sig   acetaminophen (TYLENOL) 500 MG tablet Take 1,000 mg by mouth every 6 (six) hours as needed.   atorvastatin (LIPITOR) 10 MG tablet TAKE 1 TABLET BY MOUTH DAILY.   Calcium Carbonate-Vit D-Min (CALCIUM 600+D PLUS MINERALS) 600-400 MG-UNIT TABS Take 2 tablets by mouth daily.   cetirizine (ZYRTEC) 10 MG tablet Take 1 tablet by mouth once daily as needed   EPINEPHrine 0.3 mg/0.3 mL IJ SOAJ injection Inject into the muscle.   fluticasone (FLONASE) 50 MCG/ACT nasal spray Use 2 sprays in each nostril once daily   magic mouthwash  (nystatin, hydrocortisone, diphenhydrAMINE) suspension Swish , gargle and spit 10 ml by mouth 3 times a day for 10 days   metoprolol succinate (TOPROL XL) 25 MG 24 hr tablet Take 1 tablet (25 mg total) by mouth daily.   metoprolol tartrate (LOPRESSOR) 100 MG tablet Take one tablet ('100mg'$ ) by mouth two hours prior to Cardiac CT   omeprazole (PRILOSEC OTC) 20 MG tablet Take 20 mg by mouth daily.   pimecrolimus (ELIDEL) 1 % cream Apply twice daily to eyelids at first sign of symptoms. Use until clear.   vitamin B-12 (CYANOCOBALAMIN) 1000 MCG tablet Take 1 tablet (1,000 mcg total) by mouth daily.     Allergies:   Patient has no known allergies.   Social History   Socioeconomic History   Marital status: Married    Spouse name: Not on file   Number of children: 2   Years of education: Not on file   Highest education level: Not on file  Occupational History   Not on file  Tobacco Use   Smoking status: Former   Smokeless tobacco: Never  Vaping Use   Vaping Use: Never used  Substance and Sexual Activity   Alcohol use: Yes    Comment: Rare   Drug use: No   Sexual activity: Not on file  Other Topics Concern   Not on file  Social History Narrative   Right handed   No caffeine  Three story home   Social Determinants of Health   Financial Resource Strain: Not on file  Food Insecurity: Not on file  Transportation Needs: Not on file  Physical Activity: Not on file  Stress: Not on file  Social Connections: Not on file     Family History: The patient's family history includes Cancer (age of onset: 56) in her sister; Irritable bowel syndrome in her mother; Osteoporosis in her mother; Stomach cancer in her paternal grandfather. There is no history of Colon cancer, Esophageal cancer, or Rectal cancer.  ROS:   Please see the history of present illness.     All other systems reviewed and are negative.  EKGs/Labs/Other Studies Reviewed:    The following studies were reviewed  today:   EKG:  EKG is  ordered today.  The ekg ordered today demonstrates normal sinus rhythm, heart rate 87.  Recent Labs: 05/11/2022: ALT 16; BUN 10; Creatinine, Ser 0.83; Hemoglobin 14.6; Platelets 193.0; Potassium 4.5; Sodium 142; TSH 1.54  Recent Lipid Panel    Component Value Date/Time   CHOL 227 (H) 03/13/2022 0753   TRIG 194.0 (H) 03/13/2022 0753   HDL 52.20 03/13/2022 0753   CHOLHDL 4 03/13/2022 0753   VLDL 38.8 03/13/2022 0753   LDLCALC 136 (H) 03/13/2022 0753   LDLDIRECT 168.7 03/22/2012 0832     Risk Assessment/Calculations:         Physical Exam:    VS:  BP 110/80 (BP Location: Left Arm, Patient Position: Sitting, Cuff Size: Normal)   Pulse 68   Ht '5\' 6"'$  (1.676 m)   Wt 165 lb 2 oz (74.9 kg)   LMP 03/23/2013   SpO2 98%   BMI 26.65 kg/m     Wt Readings from Last 3 Encounters:  06/26/22 165 lb 2 oz (74.9 kg)  05/11/22 167 lb 12.8 oz (76.1 kg)  03/17/22 169 lb 4 oz (76.8 kg)     GEN:  Well nourished, well developed in no acute distress HEENT: Normal NECK: No JVD; No carotid bruits LYMPHATICS: No lymphadenopathy CARDIAC: RRR, no murmurs, rubs, gallops RESPIRATORY:  Clear to auscultation without rales, wheezing or rhonchi  ABDOMEN: Soft, non-tender, non-distended MUSCULOSKELETAL:  No edema; No deformity  SKIN: Warm and dry NEUROLOGIC:  Alert and oriented x 3 PSYCHIATRIC:  Normal affect   ASSESSMENT:    1. Precordial pain   2. Pure hypercholesterolemia   3. Tachycardia   4. Chest pain, unspecified type    PLAN:    In order of problems listed above:  Chest pain, risk factors hyperlipidemia.  Previous echo with normal EF.  Obtain coronary CTA. Hyperlipidemia, continue Lipitor 10 mg daily, plan to increase Lipitor after coronary CTA.  Last lipid panel showed elevated levels. Episodes of tachycardia, history of PACs, PVCs anxiety.  Symptoms improved with starting Toprol-XL.  Continue Toprol-XL.  Plans to follow-up with PCP regarding anxiety  management if needed.    Follow-up after coronary CT scan    Medication Adjustments/Labs and Tests Ordered: Current medicines are reviewed at length with the patient today.  Concerns regarding medicines are outlined above.  Orders Placed This Encounter  Procedures   CT CORONARY MORPH W/CTA COR W/SCORE W/CA W/CM &/OR WO/CM   Basic Metabolic Panel (BMET)   EKG 12-Lead    Meds ordered this encounter  Medications   metoprolol tartrate (LOPRESSOR) 100 MG tablet    Sig: Take one tablet ('100mg'$ ) by mouth two hours prior to Cardiac CT    Dispense:  1 tablet  Refill:  0     Patient Instructions  Medication Instructions:   Your physician recommends that you continue on your current medications as directed. Please refer to the Current Medication list given to you today.  *If you need a refill on your cardiac medications before your next appointment, please call your pharmacy*   Lab Work:  Your physician recommends you go to the medical mall to have lab work completed.   If you have labs (blood work) drawn today and your tests are completely normal, you will receive your results only by: [place] (if you have MyChart) OR A paper copy in the mail If you have any lab test that is abnormal or we need to change your treatment, we will call you to review the results.   Testing/Procedures:    Your cardiac CT will be scheduled at [facility] [address] [place], [place] [address] [phone] Please arrive 15 mins early for check-in and test prep.   Please follow these instructions carefully (unless otherwise directed):  On the Night Before the Test: Be sure to Drink plenty of water. Do not consume any caffeinated/decaffeinated beverages or chocolate 12 hours prior to your test. Do not take any antihistamines 12 hours prior to your test.  On the Day of the Test: Drink plenty of water until 1 hour prior to the test. Do  not eat any food 1 hour prior to test. You may take your regular medications prior to the test.  Take metoprolol (Lopressor) two hours prior to test. FEMALES- please wear underwire-free bra if available, avoid dresses & tight clothing   After the Test: Drink plenty of water. After receiving IV contrast, you may experience a mild flushed feeling. This is normal. On occasion, you may experience a mild rash up to 24 hours after the test. This is not dangerous. If this occurs, you can take Benadryl 25 mg and increase your fluid intake. If you experience trouble breathing, this can be serious. If it is severe call 911 IMMEDIATELY. If it is mild, please call our office.  We will call to schedule your test 2-4 weeks out understanding that some insurance companies will need an authorization prior to the service being performed.   For non-scheduling related questions, please contact the cardiac imaging nurse navigator should you have any questions/concerns: Marchia Bond, Cardiac Imaging Nurse Navigator Gordy Clement, Cardiac Imaging Nurse Navigator [place] [facility] Direct Office Dial: [phone]   For scheduling needs, including cancellations and rescheduling, please call [place], [phone].    Follow-Up: At [facility], you and your health needs are our priority.  As part of our continuing mission to provide you with exceptional heart care, we have created designated Provider Care Teams.  These Care Teams include your primary Cardiologist (physician) and Advanced Practice Providers (APPs -  Physician Assistants and Nurse Practitioners) who all work together to provide you with the care you need, when you need it.  We recommend signing up for the patient portal called "MyChart".  Sign up information is provided on this After Visit Summary.  MyChart is used to connect with patients for Virtual Visits (Telemedicine).  Patients are able to view lab/test results,  encounter notes, upcoming appointments, etc.  Non-urgent messages can be sent to your provider as well.   To learn more about what you can do with MyChart, go to [email or web address].    Your next appointment:   4 - 6  week(s)  Provider:   You may see Kate Sable, MD or one of the following Advanced Practice Providers on your designated Care Team:   Murray Hodgkins, NP Christell Faith, PA-C Cadence Kathlen Mody, PA-C Gerrie Nordmann, NP    Signed, Kate Sable, MD  06/26/2022 12:20 PM    [place]

## 2022-06-26 NOTE — Patient Instructions (Signed)
Medication Instructions:   Your physician recommends that you continue on your current medications as directed. Please refer to the Current Medication list given to you today.  *If you need a refill on your cardiac medications before your next appointment, please call your pharmacy*   Lab Work:  Your physician recommends you go to the medical mall to have lab work completed.   If you have labs (blood work) drawn today and your tests are completely normal, you will receive your results only by: [place] (if you have MyChart) OR A paper copy in the mail If you have any lab test that is abnormal or we need to change your treatment, we will call you to review the results.   Testing/Procedures:    Your cardiac CT will be scheduled at [facility] [address] [place], [place] [address] [phone] Please arrive 15 mins early for check-in and test prep.   Please follow these instructions carefully (unless otherwise directed):  On the Night Before the Test: Be sure to Drink plenty of water. Do not consume any caffeinated/decaffeinated beverages or chocolate 12 hours prior to your test. Do not take any antihistamines 12 hours prior to your test.  On the Day of the Test: Drink plenty of water until 1 hour prior to the test. Do not eat any food 1 hour prior to test. You may take your regular medications prior to the test.  Take metoprolol (Lopressor) two hours prior to test. FEMALES- please wear underwire-free bra if available, avoid dresses & tight clothing   After the Test: Drink plenty of water. After receiving IV contrast, you may experience a mild flushed feeling. This is normal. On occasion, you may experience a mild rash up to 24 hours after the test. This is not dangerous. If this occurs, you can take Benadryl 25 mg and increase your fluid intake. If you experience trouble breathing, this can be serious. If it is  severe call 911 IMMEDIATELY. If it is mild, please call our office.  We will call to schedule your test 2-4 weeks out understanding that some insurance companies will need an authorization prior to the service being performed.   For non-scheduling related questions, please contact the cardiac imaging nurse navigator should you have any questions/concerns: Marchia Bond, Cardiac Imaging Nurse Navigator Gordy Clement, Cardiac Imaging Nurse Navigator  Heart [facility] V[facility] Services Direct Office Dial: [phone]   For scheduling needs, including cancellations and rescheduling, please call [place], [phone].    Follow-Up: At [facility], you and your health needs are our priority.  As part of our continuing mission to provide you with exceptional heart care, we have created designated Provider Care Teams.  These Care Teams include your primary Cardiologist (physician) and Advanced Practice Providers (APPs -  Physician Assistants and Nurse Practitioners) who all work together to provide you with the care you need, when you need it.  We recommend signing up for the patient portal called "MyChart".  Sign up information is provided on this After Visit Summary.  MyChart is used to connect with patients for Virtual Visits (Telemedicine).  Patients are able to view lab/test results, encounter notes, upcoming appointments, etc.  Non-urgent messages can be sent to your provider as well.   To learn more about what you can do with MyChart, go to [email or web address].    Your next appointment:   4 - 6  week(s)  Provider:   You may see Kate Sable, MD  or one of the following Advanced Practice Providers on your designated Care Team:   Murray Hodgkins, NP Christell Faith, PA-C Cadence Kathlen Mody, PA-C Gerrie Nordmann, NP

## 2022-06-29 DIAGNOSIS — J301 Allergic rhinitis due to pollen: Secondary | ICD-10-CM | POA: Diagnosis not present

## 2022-07-06 DIAGNOSIS — J301 Allergic rhinitis due to pollen: Secondary | ICD-10-CM | POA: Diagnosis not present

## 2022-07-12 ENCOUNTER — Telehealth (HOSPITAL_COMMUNITY): Payer: Self-pay | Admitting: *Deleted

## 2022-07-12 NOTE — Telephone Encounter (Signed)
Reaching out to patient to offer assistance regarding upcoming cardiac imaging study; pt verbalizes understanding of appt date/time, parking situation and where to check in, pre-test NPO status and medications ordered, and verified current allergies; name and call back number provided for further questions should they arise  Gordy Clement RN Navigator Card[facility] [place] [facility] [phone] office [phone] cell  Patient to hold her daily metoprolol succinate and take '100mg'$  metoprolol tartrate two hours prior to her cardiac CT scan.

## 2022-07-13 ENCOUNTER — Ambulatory Visit
Admission: RE | Admit: 2022-07-13 | Discharge: 2022-07-13 | Disposition: A | Discharge status: Home / Self Care | Payer: 59 | Source: Ambulatory Visit | Attending: Cardiology | Admitting: Cardiology

## 2022-07-13 DIAGNOSIS — R079 Chest pain, unspecified: Secondary | ICD-10-CM | POA: Diagnosis not present

## 2022-07-13 MED ORDER — METOPROLOL TARTRATE 5 MG/5ML IV SOLN
10.0000 mg | Freq: Once | INTRAVENOUS | Status: AC
  Administered 2022-07-13: 10 mg via INTRAVENOUS

## 2022-07-13 MED ORDER — NITROGLYCERIN 0.4 MG SL SUBL
0.4000 mg | SUBLINGUAL_TABLET | Freq: Once | SUBLINGUAL | Status: AC
  Administered 2022-07-13: 0.4 mg via SUBLINGUAL

## 2022-07-13 MED ORDER — IOHEXOL 350 MG/ML SOLN
75.0000 mL | Freq: Once | INTRAVENOUS | Status: AC | PRN
  Administered 2022-07-13: 75 mL via INTRAVENOUS

## 2022-07-13 MED ORDER — NITROGLYCERIN 0.4 MG SL SUBL
0.8000 mg | SUBLINGUAL_TABLET | Freq: Once | SUBLINGUAL | Status: AC
  Administered 2022-07-13: 0.8 mg via SUBLINGUAL

## 2022-07-13 MED ORDER — DILTIAZEM HCL 25 MG/5ML IV SOLN
10.0000 mg | Freq: Once | INTRAVENOUS | Status: AC
  Administered 2022-07-13: 10 mg via INTRAVENOUS

## 2022-07-13 NOTE — Progress Notes (Signed)
Patient tolerated CT well. Drank water after. Vital signs stable encourage to drink water throughout day.Reasons explained and verbalized understanding. Ambulated steady gait.  

## 2022-07-16 ENCOUNTER — Encounter: Payer: Self-pay | Admitting: Family Medicine

## 2022-07-18 ENCOUNTER — Other Ambulatory Visit: Payer: Self-pay | Admitting: Family Medicine

## 2022-07-18 ENCOUNTER — Other Ambulatory Visit: Payer: Self-pay

## 2022-07-18 MED ORDER — PAROXETINE HCL 10 MG PO TABS
10.0000 mg | ORAL_TABLET | Freq: Every day | ORAL | 3 refills | Status: DC
  Filled 2022-07-18: qty 30, 30d supply, fill #0
  Filled 2022-08-13: qty 30, 30d supply, fill #1
  Filled 2022-09-17: qty 30, 30d supply, fill #2
  Filled 2022-10-17: qty 30, 30d supply, fill #3

## 2022-07-18 MED ORDER — METOPROLOL SUCCINATE ER 25 MG PO TB24
25.0000 mg | ORAL_TABLET | Freq: Every day | ORAL | 0 refills | Status: DC
  Filled 2022-07-18: qty 30, 30d supply, fill #0

## 2022-07-19 DIAGNOSIS — J301 Allergic rhinitis due to pollen: Secondary | ICD-10-CM | POA: Diagnosis not present

## 2022-07-26 ENCOUNTER — Other Ambulatory Visit: Payer: Self-pay

## 2022-07-26 ENCOUNTER — Encounter: Payer: Self-pay | Admitting: Cardiology

## 2022-07-26 ENCOUNTER — Ambulatory Visit: Payer: 59 | Attending: Cardiology | Admitting: Cardiology

## 2022-07-26 VITALS — BP 126/82 | HR 68 | Ht 66.0 in | Wt 164.0 lb

## 2022-07-26 DIAGNOSIS — E7800 Pure hypercholesterolemia, unspecified: Secondary | ICD-10-CM | POA: Diagnosis not present

## 2022-07-26 DIAGNOSIS — R072 Precordial pain: Secondary | ICD-10-CM | POA: Diagnosis not present

## 2022-07-26 DIAGNOSIS — Z79899 Other long term (current) drug therapy: Secondary | ICD-10-CM

## 2022-07-26 DIAGNOSIS — J301 Allergic rhinitis due to pollen: Secondary | ICD-10-CM | POA: Diagnosis not present

## 2022-07-26 MED ORDER — REPATHA SURECLICK 140 MG/ML ~~LOC~~ SOAJ
1.0000 | SUBCUTANEOUS | 3 refills | Status: DC
  Filled 2022-07-26 – 2022-09-12 (×3): qty 2, 28d supply, fill #0

## 2022-07-26 NOTE — Patient Instructions (Signed)
Medication Instructions:   START Repatha - inject one pen into skin every 14 days.   *If you need a refill on your cardiac medications before your next appointment, please call your pharmacy*   Lab Work:  Your physician recommends that you return for lab work in: 3 months at the medical mall. You will need to be fasting.  No appt is needed. Hours are M-F 7AM- 6 PM.  If you have labs (blood work) drawn today and your tests are completely normal, you will receive your results only by: [place] (if you have MyChart) OR A paper copy in the mail If you have any lab test that is abnormal or we need to change your treatment, we will call you to review the results.   Testing/Procedures:  None Ordered   Follow-Up: At [facility], you and your health needs are our priority.  As part of our continuing mission to provide you with exceptional heart care, we have created designated Provider Care Teams.  These Care Teams include your primary Cardiologist (physician) and Advanced Practice Providers (APPs -  Physician Assistants and Nurse Practitioners) who all work together to provide you with the care you need, when you need it.  We recommend signing up for the patient portal called "MyChart".  Sign up information is provided on this After Visit Summary.  MyChart is used to connect with patients for Virtual Visits (Telemedicine).  Patients are able to view lab/test results, encounter notes, upcoming appointments, etc.  Non-urgent messages can be sent to your provider as well.   To learn more about what you can do with MyChart, go to [email or web address].    Your next appointment:   3 month(s)  Provider:   You may see Kate Sable, MD or one of the following Advanced Practice Providers on your designated Care Team:   Murray Hodgkins, NP Christell Faith, PA-C Cadence Kathlen Mody, PA-C Gerrie Nordmann, NP

## 2022-07-26 NOTE — Progress Notes (Signed)
Cardiology Office Note:    Date:  07/26/2022   ID:  Joyce Brock, DOB August 29, 1965, MRN [id number]  PCP:  Joyce Sanders, MD   [facility] Providers Cardiologist:  Joyce Sable, MD     Referring MD: Joyce Sanders, MD   No chief complaint on file.   History of Present Illness:    Joyce Brock is a 57 y.o. female with a hx of GERD, anxiety, hyperlipidemia who presents for follow-up.  Previously seen due to chest pain.  Coronary CTA was obtained to evaluate any CAD.  Takes Lipitor 10 mg every other day, starting intolerance/muscle aches.  Mother also has high cholesterol, she thinks she likely has familial hypercholesterolemia.  Overall doing okay, has been exercising more.  Prior notes/studies Echo 01/2020 showed normal systolic function, no wall motion abnormalities, EF 60 to 65%. Cardiac monitor 02/2020 occasional PACs and PVCs, no significant arrhythmias.  Past Medical History:  Diagnosis Date   Allergy    Anxiety    GERD (gastroesophageal reflux disease)    Hyperlipidemia    Migraines     Past Surgical History:  Procedure Laterality Date   CHOLECYSTECTOMY      Current Medications: Current Meds  Medication Sig   Evolocumab (REPATHA SURECLICK) XX123456 MG/ML SOAJ Inject 140 mg into the skin every 14 (fourteen) days.     Allergies:   Patient has no known allergies.   Social History   Socioeconomic History   Marital status: Married    Spouse name: Not on file   Number of children: 2   Years of education: Not on file   Highest education level: Not on file  Occupational History   Not on file  Tobacco Use   Smoking status: Former   Smokeless tobacco: Never  Vaping Use   Vaping Use: Never used  Substance and Sexual Activity   Alcohol use: Yes    Comment: Rare   Drug use: No   Sexual activity: Not on file  Other Topics Concern   Not on file  Social History Narrative   Right handed   No caffeine   Three story home   Social Determinants of  Health   Financial Resource Strain: Not on file  Food Insecurity: Not on file  Transportation Needs: Not on file  Physical Activity: Not on file  Stress: Not on file  Social Connections: Not on file     Family History: The patient's family history includes Cancer (age of onset: 30) in her sister; Irritable bowel syndrome in her mother; Osteoporosis in her mother; Stomach cancer in her paternal grandfather. There is no history of Colon cancer, Esophageal cancer, or Rectal cancer.  ROS:   Please see the history of present illness.     All other systems reviewed and are negative.  EKGs/Labs/Other Studies Reviewed:    The following studies were reviewed today:   EKG:  EKG not ordered today.    Recent Labs: 05/11/2022: ALT 16; Hemoglobin 14.6; Platelets 193.0; TSH 1.54 06/26/2022: BUN 9; Creatinine, Ser 0.80; Potassium 4.1; Sodium 140  Recent Lipid Panel    Component Value Date/Time   CHOL 227 (H) 03/13/2022 0753   TRIG 194.0 (H) 03/13/2022 0753   HDL 52.20 03/13/2022 0753   CHOLHDL 4 03/13/2022 0753   VLDL 38.8 03/13/2022 0753   LDLCALC 136 (H) 03/13/2022 0753   LDLDIRECT 168.7 03/22/2012 0832     Risk Assessment/Calculations:         Physical Exam:  VS:  BP 126/82   Pulse 68   Ht 5' 6"$  (1.676 m)   Wt 164 lb (74.4 kg)   LMP 03/23/2013   BMI 26.47 kg/m     Wt Readings from Last 3 Encounters:  07/26/22 164 lb (74.4 kg)  06/26/22 165 lb 2 oz (74.9 kg)  05/11/22 167 lb 12.8 oz (76.1 kg)     GEN:  Well nourished, well developed in no acute distress HEENT: Normal NECK: No JVD; No carotid bruits CARDIAC: RRR, no murmurs, rubs, gallops RESPIRATORY:  Clear to auscultation without rales, wheezing or rhonchi  ABDOMEN: Soft, non-tender, non-distended MUSCULOSKELETAL:  No edema; No deformity  SKIN: Warm and dry NEUROLOGIC:  Alert and oriented x 3 PSYCHIATRIC:  Normal affect   ASSESSMENT:    1. Precordial pain   2. Pure hypercholesterolemia   3. Medication  management    PLAN:    In order of problems listed above:  Chest pain, risk factors hyperlipidemia.  Coronary CT with calcium score of 0, no evidence of CAD.  Previous echo with normal EF.  Patient made aware of results, reassured. Hyperlipidemia, family history of elevated cholesterol, continue Lipitor 10 mg every other day.  Start PCSK9.  If not approved by insurance, consider Zetia.  Repeat lipid panel in 3 months.  Follow-up in 3 months after lipid panel.    Medication Adjustments/Labs and Tests Ordered: Current medicines are reviewed at length with the patient today.  Concerns regarding medicines are outlined above.  Orders Placed This Encounter  Procedures   Lipid panel    Meds ordered this encounter  Medications   Evolocumab (REPATHA SURECLICK) XX123456 MG/ML SOAJ    Sig: Inject 140 mg into the skin every 14 (fourteen) days.    Dispense:  2 mL    Refill:  3     Patient Instructions  Medication Instructions:   START Repatha - inject one pen into skin every 14 days.   *If you need a refill on your cardiac medications before your next appointment, please call your pharmacy*   Lab Work:  Your physician recommends that you return for lab work in: 3 months [facility] medical mall. You will need to be fasting.  No appt is needed. Hours are M-F 7AM- 6 PM.  If you have labs (blood work) drawn today and your tests are completely normal, you will receive your results only by: [place] (if you have MyChart) OR A paper copy in the mail If you have any lab test that is abnormal or we need to change your treatment, we will call you to review the results.   Testing/Procedures:  None Ordered   Follow-Up: At [facility], you and your health needs are our priority.  As part of our continuing mission to provide you with exceptional heart care, we have created designated Provider Care Teams.  These Care Teams include your primary Cardiologist (physician) and Advanced  Practice Providers (APPs -  Physician Assistants and Nurse Practitioners) who all work together to provide you with the care you need, when you need it.  We recommend signing up for the patient portal called "MyChart".  Sign up information is provided on this After Visit Summary.  MyChart is used to connect with patients for Virtual Visits (Telemedicine).  Patients are able to view lab/test results, encounter notes, upcoming appointments, etc.  Non-urgent messages can be sent to your provider as well.   To learn more about what you can do with MyChart, go  to [email or web address].    Your next appointment:   3 month(s)  Provider:   You may see Joyce Sable, MD or one of the following Advanced Practice Providers on your designated Care Team:   Murray Hodgkins, NP Christell Faith, PA-C Cadence Kathlen Mody, PA-C Gerrie Nordmann, NP   Signed, Joyce Sable, MD  07/26/2022 4:21 PM    [place]

## 2022-08-01 ENCOUNTER — Other Ambulatory Visit: Payer: Self-pay

## 2022-08-02 DIAGNOSIS — J301 Allergic rhinitis due to pollen: Secondary | ICD-10-CM | POA: Diagnosis not present

## 2022-08-03 ENCOUNTER — Other Ambulatory Visit: Payer: Self-pay

## 2022-08-09 DIAGNOSIS — J301 Allergic rhinitis due to pollen: Secondary | ICD-10-CM | POA: Diagnosis not present

## 2022-08-10 ENCOUNTER — Ambulatory Visit (INDEPENDENT_AMBULATORY_CARE_PROVIDER_SITE_OTHER): Payer: 59 | Admitting: Clinical

## 2022-08-10 DIAGNOSIS — F411 Generalized anxiety disorder: Secondary | ICD-10-CM

## 2022-08-10 NOTE — Progress Notes (Signed)
                Kylyn Mcdade, LCSW 

## 2022-08-10 NOTE — Progress Notes (Signed)
Joyce Brock  Name: Joyce Brock Date: 08/10/2022 MRN: [id number] DOB: Nov 15, 1965 PCP: Joyce Sanders, MD  Time spent: 10:34am - 11:21am  Guardian/Payee:  NA    Paperwork requested:  NA  Reason for Visit /Presenting Problem: Patient stated, "couple months ago I basically had a panic attack, I had anxiety issues in the past". Patient reported multiple life changes within the past two years (sold their house, moved in with their son, lived in a camper for 6 months prior to moving into their house, her son got married and the physical location of her job has changed several times).   Mental Status Brock: Appearance:   Well Groomed     Behavior:  Appropriate  Motor:  Normal  Speech/Language:   Clear and Coherent  Affect:  Appropriate  Mood:  normal  Thought process:  normal  Thought content:    WNL  Sensory/Perceptual disturbances:    WNL  Orientation:  oriented to person, place, situation, and day of week  Attention:  Good  Concentration:  Good  Memory:  Joyce Brock of knowledge:   Good  Insight:    Good  Judgment:   Good  Impulse Control:  Good   Reported Symptoms:  Patient reported panic attacks (typically occur at night, "feels like something is going to happen", heart starts racing, shortness of breath, feeling "unsettled", restlessness), anxiety (shallow breathing, abnormal breathing rhythm, stated "it feels like I am on the verge of a panic attack"), negative thoughts, on edge, stated "something doom and gloom is going to happen). Patient stated, "I'm doing good now". Patient reported symptoms of anxiety started prior to Joyce Brock pandemic.    Risk Assessment: Danger to Self:  No patient denied current and past suicidal ideation and symptoms of psychosis Self-injurious Behavior: No Danger to Others: No Patient denied current and past homicidal ideation  Duty to Warn:no Physical Aggression / Violence:No  Access to Firearms a  concern: No  current concern but reported access Gang Involvement:No  Patient / guardian was educated about steps to take if suicide or homicide risk level increases between visits: yes While future psychiatric events cannot be accurately predicted, the patient does not currently require acute inpatient psychiatric care and does not currently meet Joyce Brock involuntary commitment criteria.  Substance Abuse History: Current substance abuse: Yes Patient reported no current tobacco or drug use. Patient reported alcohol use approximately twice a year. Patient reported a history of tobacco use daily for 8 years, smoking 1 pack per week, with last use at age 17.   Past Psychiatric History:   Previous psychological history is significant for work/life stressors Outpatient Providers: patient reported a history of individual therapy with Joyce Brock with [facility] and history of marriage counseling 25 years ago History of Psych Hospitalization: No  Psychological Testing:  none    Abuse History:  Victim of: No.,  none    Report needed: No. Victim of Neglect:No. Perpetrator of  none reported   Witness / Exposure to Domestic Violence: No  but reported she was exposed to her father being verbally abusive towards her mother Protective Services Involvement: No  Witness to [employer] Violence:  No   Family History:  Family History  Problem Relation Age of Onset   Osteoporosis Mother    Irritable bowel syndrome Mother    Cancer Sister 38       breast    Stomach cancer Paternal Grandfather    Colon cancer  Neg Hx    Esophageal cancer Neg Hx    Rectal cancer Neg Hx   Alcohol abuse - father  Living situation: the patient lives with their spouse  Sexual Orientation: Straight  Relationship Status: married  Name of spouse / other: Joyce Brock If a parent, number of children / ages: 2 (son 70, daughter 85)  Support Systems: sister  Museum/gallery curator Stress:  No    Income/Employment/Disability: Employment  Armed forces logistics/support/administrative officer: No   Educational History: Education:  associates degree  Religion/Sprituality/World View: Christian  Any cultural differences that may affect / interfere with treatment:  not applicable   Recreation/Hobbies: being outside, crafting, being creative, painting, scrapbook/photography books, wants to learn to make stain glass  Stressors: Other: none reported    Strengths: going for a walk, prayer, going to the gym, photography books, talking to her sister  Barriers:  none reported   Legal History: Pending legal issue / charges: The patient has no significant history of legal issues. History of legal issue / charges:  none  Medical History/Surgical History: reviewed Past Medical History:  Diagnosis Date   Allergy    Anxiety    GERD (gastroesophageal reflux disease)    Hyperlipidemia    Migraines     Past Surgical History:  Procedure Laterality Date   CHOLECYSTECTOMY      Medications: Current Outpatient Medications  Medication Sig Dispense Refill   acetaminophen (TYLENOL) 500 MG tablet Take 1,000 mg by mouth every 6 (six) hours as needed.     atorvastatin (LIPITOR) 10 MG tablet TAKE 1 TABLET BY MOUTH DAILY. 90 tablet 1   Calcium Carbonate-Vit D-Min (CALCIUM 600+D PLUS MINERALS) 600-400 MG-UNIT TABS Take 2 tablets by mouth daily. 180 tablet 0   cetirizine (ZYRTEC) 10 MG tablet Take 1 tablet by mouth once daily as needed 60 tablet 14   EPINEPHrine 0.3 mg/0.3 mL IJ SOAJ injection Inject into the muscle.     Evolocumab (REPATHA SURECLICK) XX123456 MG/ML SOAJ Inject 140 mg into the skin every 14 (fourteen) days. 2 mL 3   fluticasone (FLONASE) 50 MCG/ACT nasal spray Use 2 sprays in each nostril once daily 16 g 12   magic mouthwash (nystatin, hydrocortisone, diphenhydrAMINE) suspension Swish , gargle and spit 10 ml by mouth 3 times a day for 10 days 300 mL 4   metoprolol succinate (TOPROL XL) 25 MG 24 hr tablet Take 1  tablet (25 mg total) by mouth daily. 30 tablet 0   omeprazole (PRILOSEC OTC) 20 MG tablet Take 20 mg by mouth daily.     PARoxetine (PAXIL) 10 MG tablet Take 1 tablet (10 mg total) by mouth daily. 30 tablet 3   pimecrolimus (ELIDEL) 1 % cream Apply twice daily to eyelids at first sign of symptoms. Use until clear. 30 g 3   vitamin B-12 (CYANOCOBALAMIN) 1000 MCG tablet Take 1 tablet (1,000 mcg total) by mouth daily. 90 tablet 0   No current facility-administered medications for this visit.    No Known Allergies  Diagnoses:  Generalized anxiety disorder  Plan of Care: Patient is a 57 year old female who presented for an initial assessment. Clinician conducted initial assessment in person from clinician's office at [place] [facility]. Patient stated, "couple months ago I basically had a panic attack, I had anxiety issues in the past" when clinician inquired about reason for today's visit. Patient reported the following symptoms: panic attacks (typically occur at night, "feels like something is going to happen", heart starts racing, shortness of breath, feeling "unsettled",  restlessness), anxiety (shallow breathing, abnormal breathing rhythm, negative thoughts, feeling on edge). Patient reported symptoms of anxiety started prior to the [place] pandemic but stated, "I'm doing good now". Patient denied current and past suicidal ideation, homicidal ideation, and symptoms of psychosis. Patient reported alcohol use approximately twice a year. Patient reported a history of tobacco use daily for 8 years, smoking 1 pack per week, with last use at age 28. Patient reported no current tobacco use. Patient reported no current or past drug use. Patient reported a history of participation in individual therapy and marital counseling. Patient reported no history of psychiatric hospitalizations. Patient reported no current stressors. Patient identified her sister as a support. It is recommended patient participate in  individual therapy. Clinician will review recommendations and treatment plan with patient during follow up appointment.   Katherina Right, LCSW

## 2022-08-13 ENCOUNTER — Other Ambulatory Visit: Payer: Self-pay

## 2022-08-14 ENCOUNTER — Other Ambulatory Visit: Payer: Self-pay

## 2022-08-14 MED ORDER — METOPROLOL SUCCINATE ER 25 MG PO TB24
25.0000 mg | ORAL_TABLET | Freq: Every day | ORAL | 1 refills | Status: DC
  Filled 2022-08-14: qty 30, 30d supply, fill #0
  Filled 2022-09-17: qty 30, 30d supply, fill #1

## 2022-08-16 ENCOUNTER — Other Ambulatory Visit: Payer: Self-pay

## 2022-08-16 DIAGNOSIS — J301 Allergic rhinitis due to pollen: Secondary | ICD-10-CM | POA: Diagnosis not present

## 2022-08-17 ENCOUNTER — Ambulatory Visit (INDEPENDENT_AMBULATORY_CARE_PROVIDER_SITE_OTHER): Payer: 59 | Admitting: Clinical

## 2022-08-17 DIAGNOSIS — F411 Generalized anxiety disorder: Secondary | ICD-10-CM | POA: Diagnosis not present

## 2022-08-17 NOTE — Progress Notes (Signed)
  [place] Counselor/Therapist Progress Note  Patient ID: Joyce Brock, MRN: [id number]    Date: 08/17/22  Time Spent: 10:33  am - 11:21 am : 43 Minutes  Treatment Type: Individual Therapy.  Reported Symptoms: Patient reported recent symptoms of anxiety  Mental Status Exam: Appearance:  Well Groomed     Behavior: Appropriate  Motor: Normal  Speech/Language:  Clear and Coherent  Affect: Appropriate  Mood: normal  Thought process: normal  Thought content:   WNL  Sensory/Perceptual disturbances:   WNL  Orientation: oriented to person, place, and situation  Attention: Good  Concentration: Good  Memory: WNL  Fund of knowledge:  Good  Insight:   Good  Judgment:  Good  Impulse Control: Good   Risk Assessment: Danger to Self:  No Patient denied current suicidal ideation  Self-injurious Behavior: No Danger to Others: No Patient denied current homicidal ideation  Duty to Warn:no Physical Aggression / Violence:No  Access to Firearms a concern: No  Gang Involvement:No   Subjective:  Patient reported after last session her father was taken to the emergency room and was diagnosed with possible pneumonia. Patient stated,  "all I could think of is the what ifs". Patient reported she was able to "talk myself down" in response to the thoughts and feelings of anxiety. Patient reported feeling patient and her sister will need to increase their participation in their parents' care. Patient reported she utilized prayer in response to recent thoughts and anxiety. Patient stated, "I felt like I was able to cope with it well". Patient stated, "I feel good" in response to current mood.  Patient stated, "I'm not surprised" in response to diagnosis. Patient reported she is open to participation in therapy.    Interventions: Clinician conducted session in person at clinician's office at [facility]. Reviewed events since last session. Clinician reviewed diagnosis and  treatment recommendations. Provided psycho education related to diagnosis and treatment. Provided psycho education related to the use of CBT and symptoms of anxiety.   Diagnosis:  GAD (generalized anxiety disorder)   Plan: Goals to be determined during follow up appointment on 09/07/22.                  Katherina Right, LCSW

## 2022-08-23 DIAGNOSIS — J301 Allergic rhinitis due to pollen: Secondary | ICD-10-CM | POA: Diagnosis not present

## 2022-08-30 DIAGNOSIS — J301 Allergic rhinitis due to pollen: Secondary | ICD-10-CM | POA: Diagnosis not present

## 2022-09-06 ENCOUNTER — Other Ambulatory Visit: Payer: Self-pay

## 2022-09-06 DIAGNOSIS — J301 Allergic rhinitis due to pollen: Secondary | ICD-10-CM | POA: Diagnosis not present

## 2022-09-07 ENCOUNTER — Ambulatory Visit (INDEPENDENT_AMBULATORY_CARE_PROVIDER_SITE_OTHER): Payer: 59 | Admitting: Clinical

## 2022-09-07 DIAGNOSIS — F411 Generalized anxiety disorder: Secondary | ICD-10-CM | POA: Diagnosis not present

## 2022-09-07 NOTE — Progress Notes (Signed)
[place] Counselor/Therapist Progress Note  Patient ID: Joyce Brock, MRN: [id number]    Date: 09/07/22  Time Spent: 8:33  am - 9:26 am : 13 Minutes  Treatment Type: Individual Therapy.  Reported Symptoms: Patient stated, "pretty steady" in response to mood since last session.   Mental Status Exam: Appearance:  Well Groomed     Behavior: Appropriate  Motor: Normal  Speech/Language:  Clear and Coherent  Affect: Appropriate  Mood: normal  Thought process: normal  Thought content:   WNL  Sensory/Perceptual disturbances:   WNL  Orientation: oriented to person, place, and situation  Attention: Good  Concentration: Good  Memory: WNL  Fund of knowledge:  Good  Insight:   Good  Judgment:  Good  Impulse Control: Good   Risk Assessment: Danger to Self:  No Patient denied current suicidal ideation  Self-injurious Behavior: No Danger to Others: No Patient denied current homicidal ideation  Duty to Warn:no Physical Aggression / Violence:No  Access to Firearms a concern: No  Gang Involvement:No   Subjective:  Patient stated, "everything's been good". Patient reported no changes since last session. Patient reported her parents are doing well. Patient stated, "I feel like I'm a little more settled at the new office" in response to recent office move. Patient stated she is trying to "find my place" in the [place] office since she is only there one day per week. Patient stated, "I feel like it's been good" in response to mood since last session. Patient stated, "to be more receptive to things that just happen" in response to goals for therapy. Patient stated, "to learn how to process overwhelming emotions" in response to goals for therapy. Patient reported learning how to express her thoughts/feelings in response to unplanned events as a goal for therapy. Patient stated, "if I have time to process things it goes much smoother" in response to unplanned events. Patient  agreed to goals/treatment plan.   Interventions: Motivational Interviewing. Clinician conducted session in person at clinician's office at [facility]. Reviewed events since last session and status of previous stressors. Clinician utilized motivational interviewing to explore potential goals for therapy. Clinician utilized a task centered approach in collaboration with patient to develop goals for therapy. Clinician requested patient complete thought record for homework.   Diagnosis:  Generalized anxiety disorder   Plan: Patient is to utilize [facility], thought re-framing, relaxation techniques, mindfulness and coping strategies to decrease symptoms associated with Generalized Anxiety Disorder. Frequency: bi-weekly  Modality: individual     Long-term goal:   Reduce overall level, frequency, and intensity of the feelings of anxiety and panic as evidenced by decrease in panic attacks, anxiety, negative thoughts, feeling defensive, difficulty focusing, feeling on edge, lack of motivation, and feeling unproductive from 3 to 5 days/week to 0 to 1 days/week per patient report for at least 3 consecutive months. Target Date: 09/07/23  Progress: 0   Short-term goal:  Reduce overall level, frequency, and intensity of feeling overwhelmed, frustration, and irritability in response to the occurrence of unplanned events Target Date: 03/10/23  Progress: 0   Identify triggers for feeling overwhelmed, frustrated, and irritable when unplanned events occur and develop/implement strategies to process thoughts/feelings prior to responding.  Target Date: 03/10/23  Progress: 0   Develop effective communication strategies for patient to utilize when expressing her thoughts and feelings to others in a healthy, controlled and assertive way  Target Date: 03/10/23  Progress: 0   Verbally express an understanding of the relationship  between feelings of anxiety and the impact on thinking  patterns and behaviors. Target Date: 03/10/23  Progress: 0   Identify, challenge, and replace negative thought patterns that contribute to feelings of  anxiety with positive thoughts and beliefs per patient's report Target Date: 03/10/23  Progress: 0                       Katherina Right, LCSW

## 2022-09-12 ENCOUNTER — Other Ambulatory Visit: Payer: Self-pay

## 2022-09-13 DIAGNOSIS — J301 Allergic rhinitis due to pollen: Secondary | ICD-10-CM | POA: Diagnosis not present

## 2022-09-17 ENCOUNTER — Other Ambulatory Visit: Payer: Self-pay | Admitting: Family Medicine

## 2022-09-17 ENCOUNTER — Other Ambulatory Visit: Payer: Self-pay

## 2022-09-18 ENCOUNTER — Other Ambulatory Visit: Payer: Self-pay

## 2022-09-18 ENCOUNTER — Other Ambulatory Visit: Payer: Self-pay | Admitting: Family Medicine

## 2022-09-18 MED FILL — Atorvastatin Calcium Tab 10 MG (Base Equivalent): ORAL | 90 days supply | Qty: 90 | Fill #0 | Status: AC

## 2022-09-20 DIAGNOSIS — J301 Allergic rhinitis due to pollen: Secondary | ICD-10-CM | POA: Diagnosis not present

## 2022-09-21 ENCOUNTER — Ambulatory Visit (INDEPENDENT_AMBULATORY_CARE_PROVIDER_SITE_OTHER): Payer: 59 | Admitting: Clinical

## 2022-09-21 DIAGNOSIS — F411 Generalized anxiety disorder: Secondary | ICD-10-CM

## 2022-09-21 NOTE — Progress Notes (Signed)
[facility] Counselor/Therapist Progress Note  Patient ID: Joyce Brock, MRN: [id number],    Date: 09/21/2022  Time Spent: 2:39pm - 3:34pm : 55 minutes   Treatment Type: Individual Therapy  Reported Symptoms: Patient stated, "I'm good" in response to current mood.   Mental Status Exam: Appearance:  Well Groomed     Behavior: Appropriate  Motor: Normal  Speech/Language:  Clear and Coherent  Affect: Appropriate  Mood: normal  Thought process: normal  Thought content:   WNL  Sensory/Perceptual disturbances:   WNL  Orientation: oriented to person, place, and situation  Attention: Good  Concentration: Good  Memory: WNL  Fund of knowledge:  Good  Insight:   Good  Judgment:  Good  Impulse Control: Good   Risk Assessment: Danger to Self:  No Patient denied current suicidal ideation  Self-injurious Behavior: No Danger to Others: No Patient denied current homicidal ideation  Duty to Warn:no Physical Aggression / Violence:No  Access to Firearms a concern: No  Gang Involvement:No   Subjective: Patient stated, "really good" in response to events since last session. Patient reported difficulty completing her thought record for homework due to lack of triggering events. Patient reported she noted in her thought record, unrealistic expectations of herself and feeling she needs to act a certain way when plans arise with specific people. Patient reported a recent situation at work was a trigger for thoughts/feelings in her thought record. Patient reported her director's husband texted patient's colleague to let her know her director was in the hospital. Patient reported her feelings were hurt that she did not receive the text. Patient stated, " I feel like I go to the extreme sometimes". Patient reported situations with this colleague in the past have been a trigger for anxiety. Patient reported she tries to be encouraging of others but doesn't feel it is reciprocated by  others. Patient reported when her husband feels he has to use all four coliseum tickets each time they are available, it is a trigger for anxiety.   Interventions: Cognitive Behavioral Therapy. Clinician conducted session in person at clinician's office at [facility]. Reviewed events since last session.  Reviewed patient's thought record. Explored and identified triggers for anxiety and other changes in mood. Challenged cognitive distortions/negative thoughts expressed during session. Assessed patient's mood. Clinician requested patient continue thought record for homework and utilize socratic questions to challenge thoughts identified.    Diagnosis:  Generalized anxiety disorder     Plan: Patient is to utilize [employer] Therapy, thought re-framing, relaxation techniques, mindfulness and coping strategies to decrease symptoms associated with Generalized Anxiety Disorder. Frequency: bi-weekly  Modality: individual      Long-term goal:   Reduce overall level, frequency, and intensity of the feelings of anxiety and panic as evidenced by decrease in panic attacks, anxiety, negative thoughts, feeling defensive, difficulty focusing, feeling on edge, lack of motivation, and feeling unproductive from 3 to 5 days/week to 0 to 1 days/week per patient report for at least 3 consecutive months. Target Date: 09/07/23  Progress: progressing    Short-term goal:  Reduce overall level, frequency, and intensity of feeling overwhelmed, frustration, and irritability in response to the occurrence of unplanned events Target Date: 03/10/23  Progress: progressing    Identify triggers for feeling overwhelmed, frustrated, and irritable when unplanned events occur and develop/implement strategies to process thoughts/feelings prior to responding.  Target Date: 03/10/23  Progress: progressing    Develop effective communication strategies for patient to utilize when expressing her thoughts  and feelings  to others in a healthy, controlled and assertive way  Target Date: 03/10/23  Progress: 0    Verbally express an understanding of the relationship between feelings of anxiety and the impact on thinking patterns and behaviors. Target Date: 03/10/23  Progress: 0    Identify, challenge, and replace negative thought patterns that contribute to feelings of  anxiety with positive thoughts and beliefs per patient's report Target Date: 03/10/23  Progress: progressing     Doree Barthel, LCSW

## 2022-09-21 NOTE — Progress Notes (Signed)
                Videl Nobrega, LCSW 

## 2022-09-27 DIAGNOSIS — J301 Allergic rhinitis due to pollen: Secondary | ICD-10-CM | POA: Diagnosis not present

## 2022-10-04 DIAGNOSIS — J301 Allergic rhinitis due to pollen: Secondary | ICD-10-CM | POA: Diagnosis not present

## 2022-10-17 ENCOUNTER — Other Ambulatory Visit: Payer: Self-pay

## 2022-10-17 MED ORDER — METOPROLOL SUCCINATE ER 25 MG PO TB24
25.0000 mg | ORAL_TABLET | Freq: Every day | ORAL | 0 refills | Status: DC
  Filled 2022-10-17: qty 30, 30d supply, fill #0

## 2022-10-18 DIAGNOSIS — J301 Allergic rhinitis due to pollen: Secondary | ICD-10-CM | POA: Diagnosis not present

## 2022-10-25 DIAGNOSIS — J301 Allergic rhinitis due to pollen: Secondary | ICD-10-CM | POA: Diagnosis not present

## 2022-10-26 ENCOUNTER — Ambulatory Visit: Payer: 59 | Admitting: Cardiology

## 2022-10-26 ENCOUNTER — Ambulatory Visit (INDEPENDENT_AMBULATORY_CARE_PROVIDER_SITE_OTHER): Payer: 59 | Admitting: Clinical

## 2022-10-26 DIAGNOSIS — F411 Generalized anxiety disorder: Secondary | ICD-10-CM

## 2022-10-26 NOTE — Progress Notes (Signed)
                Arabela Basaldua, LCSW 

## 2022-10-26 NOTE — Progress Notes (Signed)
[facility] Counselor/Therapist Progress Note  Patient ID: Joyce Brock, MRN: [id number],    Date: 10/26/2022  Time Spent: 1:32pm - 2:33pm : 61 minutes   Treatment Type: Individual Therapy  Reported Symptoms: Patient reported recent thoughts that she might die  Mental Status Exam: Appearance:  Well Groomed     Behavior: Appropriate  Motor: Normal  Speech/Language:  Clear and Coherent  Affect: Appropriate  Mood: normal  Thought process: normal  Thought content:   WNL  Sensory/Perceptual disturbances:   WNL  Orientation: oriented to person, place, and situation  Attention: Good  Concentration: Good  Memory: WNL  Fund of knowledge:  Good  Insight:   Good  Judgment:  Good  Impulse Control: Good   Risk Assessment: Danger to Self:  No Patient denied current suicidal ideation  Self-injurious Behavior: No Danger to Others: No Patient denied current homicidal ideation Duty to Warn:no Physical Aggression / Violence:No  Access to Firearms a concern: No  Gang Involvement:No   Subjective: Patient stated, "they've been going really good" in response to events since last session. Patient reported her grandchild was born may on 1st. Patient stated, "its been challenging" in response to navigating recent changes in family dynamics. Patient reported recent thoughts that she might die due to pain in her knee. Patient reported she thought she might have a blood clot in her calf. Patient reported she "started backing up and being more realistic" in response to the thoughts. Patient stated, "that panicky feeling dissipated" in response to using the challenging questions. Patient reported the thoughts regarding her knee triggered an increase in feelings of anxiety. Patient stated, "thinking through those, it really did help" in response to use of the challenging questions. Patient reported one entry in her thought record was related to her husband calling to ask if he could pick  patient up earlier for an outing. Patient stated, "It (thought record) did help me to think through" in response to her husband's request. Patient reported she felt her husband knew he was going to their accountant's office when he left that morning, and reported she felt if she had know that information she could have accommodated his request to pick patient up earlier. Patient reported she has expectation of herself to always be prepared for various situations. Patient reported feeling she has to accommodate everyone. Patient stated, "I'm doing better at not reacting right away on a text". Patient reported unresolved feelings regarding her daughter in law due to statements that have been made in the past that patient found hurtful.    Interventions: Cognitive Behavioral Therapy and Interpersonal. Clinician conducted session in person at clinician's office at [facility]. Reviewed events since last session.  Reviewed patient's thought record and assisted patient in processing patient's entries. Challenged cognitive distortions/negative thoughts expressed during session. Explored and identified triggers for negative thoughts. Discussed patient's thoughts/feelings in regards to patient's relationship with her daughter in law. Discussed journaling patient's thoughts/feelings regarding patient's relationship with her daughter in law. Clinician requested patient continue thought record for homework and utilize socratic questions to challenge thoughts identified.   Collaboration of Care: Other not required at this time  Diagnosis:  Generalized anxiety disorder     Plan: Patient is to utilize [employer] Therapy, thought re-framing, relaxation techniques, mindfulness and coping strategies to decrease symptoms associated with Generalized Anxiety Disorder. Frequency: bi-weekly  Modality: individual      Long-term goal:   Reduce overall level, frequency, and intensity of the feelings of  anxiety and panic as evidenced by decrease in panic attacks, anxiety, negative thoughts, feeling defensive, difficulty focusing, feeling on edge, lack of motivation, and feeling unproductive from 3 to 5 days/week to 0 to 1 days/week per patient report for at least 3 consecutive months. Target Date: 09/07/23  Progress: progressing    Short-term goal:  Reduce overall level, frequency, and intensity of feeling overwhelmed, frustration, and irritability in response to the occurrence of unplanned events Target Date: 03/10/23  Progress: progressing    Identify triggers for feeling overwhelmed, frustrated, and irritable when unplanned events occur and develop/implement strategies to process thoughts/feelings prior to responding.  Target Date: 03/10/23  Progress: progressing    Develop effective communication strategies for patient to utilize when expressing her thoughts and feelings to others in a healthy, controlled and assertive way  Target Date: 03/10/23  Progress: 0    Verbally express an understanding of the relationship between feelings of anxiety and the impact on thinking patterns and behaviors. Target Date: 03/10/23  Progress: 0    Identify, challenge, and replace negative thought patterns that contribute to feelings of  anxiety with positive thoughts and beliefs per patient's report Target Date: 03/10/23  Progress: progressing       Doree Barthel, LCSW

## 2022-11-01 DIAGNOSIS — H524 Presbyopia: Secondary | ICD-10-CM | POA: Diagnosis not present

## 2022-11-01 DIAGNOSIS — J301 Allergic rhinitis due to pollen: Secondary | ICD-10-CM | POA: Diagnosis not present

## 2022-11-08 DIAGNOSIS — J301 Allergic rhinitis due to pollen: Secondary | ICD-10-CM | POA: Diagnosis not present

## 2022-11-14 DIAGNOSIS — J301 Allergic rhinitis due to pollen: Secondary | ICD-10-CM | POA: Diagnosis not present

## 2022-11-15 DIAGNOSIS — J301 Allergic rhinitis due to pollen: Secondary | ICD-10-CM | POA: Diagnosis not present

## 2022-11-16 ENCOUNTER — Ambulatory Visit (INDEPENDENT_AMBULATORY_CARE_PROVIDER_SITE_OTHER): Payer: 59 | Admitting: Clinical

## 2022-11-16 DIAGNOSIS — F411 Generalized anxiety disorder: Secondary | ICD-10-CM | POA: Diagnosis not present

## 2022-11-16 NOTE — Progress Notes (Addendum)
[facility] Counselor/Therapist Progress Note  Patient ID: Joyce Brock, MRN: [id number],    Date: 11/16/2022  Time Spent: 10:38am - 11:33am : 55 minutes  Treatment Type: Individual Therapy  Reported Symptoms: none reported  Mental Status Exam: Appearance:  Neat and Well Groomed     Behavior: Appropriate  Motor: Normal  Speech/Language:  Clear and Coherent  Affect: Appropriate  Mood: normal  Thought process: normal  Thought content:   WNL  Sensory/Perceptual disturbances:   WNL  Orientation: oriented to person, place, and situation  Attention: Good  Concentration: Good  Memory: WNL  Fund of knowledge:  Good  Insight:   Good  Judgment:  Good  Impulse Control: Good   Risk Assessment: Danger to Self:  No Patient denied current suicidal ideation  Self-injurious Behavior: No Danger to Others: No Patient denied current homicidal ideation Duty to Warn:no Physical Aggression / Violence:No  Access to Firearms a concern: No  Gang Involvement:No   Subjective: Patient reported several situations have occurred since last session in which patient responded to her son/daughter in Social worker. Patient reported she texted her son to express patient's thoughts/feelings in response to a recent situation regarding their grandchild. Patient reported her son, daughter in law, and grandchild visited patient recently and patient's son texted patient the next day to indicate they did not want anyone to kiss their child. Patient reported feeling confused when her son and daughter in law indicated the dog was allowed to lick the baby but patient was not allowed to kiss her grandchild. Patient reported she feels she is being accommodating to her son and daughter in law. Patient reported she is trying to navigate her daughter in law's expectations. Patient reported she feels she needs to write her daughter in law a letter regarding statements made at son/daughter in law's wedding. Patient  reported her mother in law initiates conversations with patient regarding patient's son/daughter in law's wedding. Patient reported she has tried to "diffuse" the conversation when they occur.  Patient stated, "Im trying to think through them" and reported she is trying to be more intentional in her responses to her son and daughter in law. Patient reported she recently experienced labored breathing and tried to identify thoughts connected to the physical symptoms. Patient reported she utilized deep breathing in response.   Interventions: Cognitive Behavioral Therapy and Interpersonal. Clinician conducted session in person at clinician's office at [facility]. Reviewed events since last session.  Reviewed patient's thought record. Discussed recent situation in which son/daughter in law expressed their feelings regarding patient kissing her granddaughter. Discussed patient's thoughts/feelings regarding the rules son/daughter in law have established regarding their baby. Discussed strategies to utilize in response to conversations with her mother in law regarding son/daughter in law's wedding. Provided psycho education related to visualization/imagery. Clinician requested patient continue thought record for homework and utilize socratic questions to challenge thoughts identified.    Collaboration of Care: Other not required at this time   Diagnosis:  Generalized anxiety disorder     Plan: Patient is to utilize [employer], thought re-framing, relaxation techniques, mindfulness and coping strategies to decrease symptoms associated with Generalized Anxiety Disorder. Frequency: bi-weekly  Modality: individual      Long-term goal:   Reduce overall level, frequency, and intensity of the feelings of anxiety and panic as evidenced by decrease in panic attacks, anxiety, negative thoughts, feeling defensive, difficulty focusing, feeling on edge, lack of motivation, and feeling  unproductive from 3 to 5 days/week to  0 to 1 days/week per patient report for at least 3 consecutive months. Target Date: 09/07/23  Progress: progressing    Short-term goal:  Reduce overall level, frequency, and intensity of feeling overwhelmed, frustration, and irritability in response to the occurrence of unplanned events Target Date: 03/10/23  Progress: progressing    Identify triggers for feeling overwhelmed, frustrated, and irritable when unplanned events occur and develop/implement strategies to process thoughts/feelings prior to responding.  Target Date: 03/10/23  Progress: progressing    Develop effective communication strategies for patient to utilize when expressing her thoughts and feelings to others in a healthy, controlled and assertive way  Target Date: 03/10/23  Progress: progressing    Verbally express an understanding of the relationship between feelings of anxiety and the impact on thinking patterns and behaviors. Target Date: 03/10/23  Progress: progressing    Identify, challenge, and replace negative thought patterns that contribute to feelings of  anxiety with positive thoughts and beliefs per patient's report Target Date: 03/10/23  Progress: progressing       Doree Barthel, LCSW

## 2022-11-16 NOTE — Progress Notes (Signed)
                Lorin Gawron, LCSW 

## 2022-11-21 ENCOUNTER — Other Ambulatory Visit: Payer: Self-pay

## 2022-11-21 ENCOUNTER — Other Ambulatory Visit: Payer: Self-pay | Admitting: Family Medicine

## 2022-11-21 ENCOUNTER — Telehealth: Payer: Self-pay | Admitting: Cardiology

## 2022-11-21 DIAGNOSIS — Z79899 Other long term (current) drug therapy: Secondary | ICD-10-CM

## 2022-11-21 MED ORDER — PAROXETINE HCL 10 MG PO TABS
10.0000 mg | ORAL_TABLET | Freq: Every day | ORAL | 0 refills | Status: DC
  Filled 2022-11-21: qty 90, 90d supply, fill #0

## 2022-11-21 MED ORDER — METOPROLOL SUCCINATE ER 25 MG PO TB24
25.0000 mg | ORAL_TABLET | Freq: Every day | ORAL | 1 refills | Status: DC
  Filled 2022-11-21: qty 90, 90d supply, fill #0
  Filled 2023-02-25: qty 90, 90d supply, fill #1

## 2022-11-21 NOTE — Telephone Encounter (Signed)
Pt c/o medication issue:  1. Name of Medication: metoprolol succinate (TOPROL XL) 25 MG 24 hr tablet   2. How are you currently taking this medication (dosage and times per day)?    3. Are you having a reaction (difficulty breathing--STAT)? no  4. What is your medication issue? Patient calling in to get a refill one medication, but it states she needs to make an appt. Patient is questioning if the appt is needed since she not taking the Evolocumab (REPATHA SURECLICK) 140 MG/ML SOAJ , because her insurance didn't cover it. Patient just had an appt on 07/26/2024. Please advise

## 2022-11-21 NOTE — Telephone Encounter (Signed)
Reviewed the patient's chart.  She has been on metoprolol succinate 25 mg once daily since 04/2021. I spoke with the patient regarding her Repatha and she advised her insurance did not approve this, so she cancelled the order. I advised her that based on Dr. Merita Norton office note from 07/2022, if insurance did not approved Repatha, he would think about putting her on Zetia.  I advised the patient I will: - Refill metoprolol - Follow up with Dr. Azucena Cecil if he would like her to start Zetia, then repeat lipids in 3 months followed by an office visit.   She is aware we will be back in touch with her once MD recommendations are received.  The patient voices understanding and is agreeable.

## 2022-11-22 ENCOUNTER — Ambulatory Visit: Payer: 59 | Admitting: Family Medicine

## 2022-11-22 ENCOUNTER — Encounter: Payer: Self-pay | Admitting: Family Medicine

## 2022-11-22 VITALS — BP 122/82 | HR 63 | Temp 97.6°F | Ht 66.0 in | Wt 163.0 lb

## 2022-11-22 DIAGNOSIS — M25511 Pain in right shoulder: Secondary | ICD-10-CM | POA: Diagnosis not present

## 2022-11-22 DIAGNOSIS — M25562 Pain in left knee: Secondary | ICD-10-CM | POA: Insufficient documentation

## 2022-11-22 DIAGNOSIS — R197 Diarrhea, unspecified: Secondary | ICD-10-CM | POA: Insufficient documentation

## 2022-11-22 DIAGNOSIS — M25561 Pain in right knee: Secondary | ICD-10-CM | POA: Insufficient documentation

## 2022-11-22 DIAGNOSIS — J301 Allergic rhinitis due to pollen: Secondary | ICD-10-CM | POA: Diagnosis not present

## 2022-11-22 LAB — CBC WITH DIFFERENTIAL/PLATELET
Basophils Absolute: 0.1 10*3/uL (ref 0.0–0.1)
Basophils Relative: 1.2 % (ref 0.0–3.0)
Eosinophils Absolute: 0.1 10*3/uL (ref 0.0–0.7)
Eosinophils Relative: 2.4 % (ref 0.0–5.0)
HCT: 42.5 % (ref 36.0–46.0)
Hemoglobin: 14 g/dL (ref 12.0–15.0)
Lymphocytes Relative: 34.3 % (ref 12.0–46.0)
Lymphs Abs: 1.7 10*3/uL (ref 0.7–4.0)
MCHC: 32.9 g/dL (ref 30.0–36.0)
MCV: 91.6 fl (ref 78.0–100.0)
Monocytes Absolute: 0.2 10*3/uL (ref 0.1–1.0)
Monocytes Relative: 4.8 % (ref 3.0–12.0)
Neutro Abs: 2.8 10*3/uL (ref 1.4–7.7)
Neutrophils Relative %: 57.3 % (ref 43.0–77.0)
Platelets: 177 10*3/uL (ref 150.0–400.0)
RBC: 4.64 Mil/uL (ref 3.87–5.11)
RDW: 13.5 % (ref 11.5–15.5)
WBC: 4.8 10*3/uL (ref 4.0–10.5)

## 2022-11-22 LAB — COMPREHENSIVE METABOLIC PANEL
ALT: 17 U/L (ref 0–35)
AST: 19 U/L (ref 0–37)
Albumin: 4.4 g/dL (ref 3.5–5.2)
Alkaline Phosphatase: 74 U/L (ref 39–117)
BUN: 8 mg/dL (ref 6–23)
CO2: 29 mEq/L (ref 19–32)
Calcium: 9.8 mg/dL (ref 8.4–10.5)
Chloride: 103 mEq/L (ref 96–112)
Creatinine, Ser: 0.81 mg/dL (ref 0.40–1.20)
GFR: 80.35 mL/min (ref >60.00)
Glucose, Bld: 92 mg/dL (ref 70–99)
Potassium: 4.2 mEq/L (ref 3.5–5.1)
Sodium: 139 mEq/L (ref 135–145)
Total Bilirubin: 0.6 mg/dL (ref 0.2–1.2)
Total Protein: 7.6 g/dL (ref 6.0–8.3)

## 2022-11-22 LAB — SEDIMENTATION RATE: Sed Rate: 19 mm/hr (ref 0–30)

## 2022-11-22 LAB — C-REACTIVE PROTEIN: CRP: <1 mg/dL (ref 0.5–20.0)

## 2022-11-22 NOTE — Assessment & Plan Note (Signed)
Acute, most consistent with osteoarthritis.  Did have flare with redness and swelling now resolved.  Will evaluate with x-ray for evidence of osteoarthritis versus autoimmune joint issue.  Of note joint pain is not symmetric so I am doubtful of autoimmune involvement of joints. Check sed rate and CRP

## 2022-11-22 NOTE — Assessment & Plan Note (Signed)
Acute, most consistent with rotator cuff tendinitis versus osteoarthritis versus bursitis. Given current GI issues we will hold off on NSAID.  Will treat with home physical therapy for rotator cuff.

## 2022-11-22 NOTE — Addendum Note (Signed)
Addended by: Alvina Chou on: 11/22/2022 11:39 AM   Modules accepted: Orders

## 2022-11-22 NOTE — Progress Notes (Signed)
Patient ID: Joyce Brock, female    DOB: 1965/09/07, 57 y.o.   MRN: [id number]  This visit was conducted in person.  BP 122/82 (BP Location: Left Arm, Patient Position: Sitting, Cuff Size: Normal)   Pulse 63   Temp 97.6 F (36.4 C) (Temporal)   Ht 5\' 6"  (1.676 m)   Wt 163 lb (73.9 kg)   LMP 03/23/2013   SpO2 99%   BMI 26.31 kg/m    CC:  Chief Complaint  Patient presents with   Arm Pain    Patient states arm pain is more muscular and started about a year ago; in right arm.   Knee Pain    In left knee since April. She does not take anything for the pain.   Diarrhea    Patient states this started over 2 months ago. States that this started with a lot of mucus and has also has some nausea.     Subjective:   HPI: Joyce Brock is a 57 y.o. female presenting on 11/22/2022 for Arm Pain (Patient states arm pain is more muscular and started about a year ago; in right arm.), Knee Pain (In left knee since April. She does not take anything for the pain.), and Diarrhea (Patient states this started over 2 months ago. States that this started with a lot of mucus and has also has some nausea. )    Pt presents with multiple complaints.   1.  Right arm pain x 1 year. Pain with abduction, int/ext rotation. No preceding falls or change in activity.  Not using medication to treat.  2.  Left knee pain x 2 months.. started suddenly.  Noted swelling and slight redness in left knee, occurring intermittently. Right medial is most.  Worse with squatting, walking up stairs. No recent falls or change in activity.  3.  Diarrhea and nausea ongoing x 2 months  She  has noted this starting possibly after stomach bug ( N/V?D x 2-3 days) in 08/2022.  She has noted increased urgency to have BM,  watery stool.. frequency 3-5 per day.. small amount each time. Explosive, associated with gas.  Occ incontinence. Feel stool looks greasier, mucusy.  No clear food association, trying to avoid fried  food.   Has history of internal hemorrhoid. No new medications other than  Started paxil 07/2022 History of reflux  She does have a well, but new build, no travel outside   Mother with IBS.     She is worried about inflammatory bowel disease or auto immune disease.  S/P gallballdder removal.   Reviewed CT abd pelvis 2023  Colonoscopy: 2019  polyps.   TSH 04/2022  Relevant past medical, surgical, family and social history reviewed and updated as indicated. Interim medical history since our last visit reviewed. Allergies and medications reviewed and updated. Outpatient Medications Prior to Visit  Medication Sig Dispense Refill   acetaminophen (TYLENOL) 500 MG tablet Take 1,000 mg by mouth every 6 (six) hours as needed.     atorvastatin (LIPITOR) 10 MG tablet Take 1 tablet (10 mg total) by mouth daily. 90 tablet 1   Calcium Carbonate-Vit D-Min (CALCIUM 600+D PLUS MINERALS) 600-400 MG-UNIT TABS Take 2 tablets by mouth daily. 180 tablet 0   cetirizine (ZYRTEC) 10 MG tablet Take 1 tablet by mouth once daily as needed 60 tablet 14   EPINEPHrine 0.3 mg/0.3 mL IJ SOAJ injection Inject into the muscle.     Evolocumab (REPATHA SURECLICK) 140 MG/ML SOAJ Inject 140  mg into the skin every 14 (fourteen) days. 2 mL 3   fluticasone (FLONASE) 50 MCG/ACT nasal spray Use 2 sprays in each nostril once daily 16 g 12   magic mouthwash (nystatin, hydrocortisone, diphenhydrAMINE) suspension Swish , gargle and spit 10 ml by mouth 3 times a day for 10 days 300 mL 4   metoprolol succinate (TOPROL XL) 25 MG 24 hr tablet Take 1 tablet (25 mg total) by mouth daily. 90 tablet 1   PARoxetine (PAXIL) 10 MG tablet Take 1 tablet (10 mg total) by mouth daily. 90 tablet 0   pimecrolimus (ELIDEL) 1 % cream Apply twice daily to eyelids at first sign of symptoms. Use until clear. 30 g 3   vitamin B-12 (CYANOCOBALAMIN) 1000 MCG tablet Take 1 tablet (1,000 mcg total) by mouth daily. 90 tablet 0   omeprazole (PRILOSEC OTC) 20  MG tablet Take 20 mg by mouth daily.     No facility-administered medications prior to visit.     Per HPI unless specifically indicated in ROS section below Review of Systems  Constitutional:  Negative for fatigue and fever.  HENT:  Negative for congestion.   Eyes:  Negative for pain.  Respiratory:  Negative for cough and shortness of breath.   Cardiovascular:  Negative for chest pain, palpitations and leg swelling.  Gastrointestinal:  Negative for abdominal pain.  Genitourinary:  Negative for dysuria and vaginal bleeding.  Musculoskeletal:  Positive for arthralgias and joint swelling. Negative for back pain.  Neurological:  Negative for syncope, light-headedness and headaches.  Psychiatric/Behavioral:  Negative for dysphoric mood.    Objective:  BP 122/82 (BP Location: Left Arm, Patient Position: Sitting, Cuff Size: Normal)   Pulse 63   Temp 97.6 F (36.4 C) (Temporal)   Ht 5\' 6"  (1.676 m)   Wt 163 lb (73.9 kg)   LMP 03/23/2013   SpO2 99%   BMI 26.31 kg/m   Wt Readings from Last 3 Encounters:  11/22/22 163 lb (73.9 kg)  07/26/22 164 lb (74.4 kg)  06/26/22 165 lb 2 oz (74.9 kg)      Physical Exam Constitutional:      General: She is not in acute distress.    Appearance: Normal appearance. She is well-developed. She is not ill-appearing or toxic-appearing.  HENT:     Head: Normocephalic.     Right Ear: Hearing, tympanic membrane, ear canal and external ear normal. Tympanic membrane is not erythematous, retracted or bulging.     Left Ear: Hearing, tympanic membrane, ear canal and external ear normal. Tympanic membrane is not erythematous, retracted or bulging.     Nose: No mucosal edema or rhinorrhea.     Right Sinus: No maxillary sinus tenderness or frontal sinus tenderness.     Left Sinus: No maxillary sinus tenderness or frontal sinus tenderness.     Mouth/Throat:     Pharynx: Uvula midline.  Eyes:     General: Lids are normal. Lids are everted, no foreign bodies  appreciated.     Conjunctiva/sclera: Conjunctivae normal.     Pupils: Pupils are equal, round, and reactive to light.  Neck:     Thyroid: No thyroid mass or thyromegaly.     Vascular: No carotid bruit.     Trachea: Trachea normal.  Cardiovascular:     Rate and Rhythm: Normal rate and regular rhythm.     Pulses: Normal pulses.     Heart sounds: Normal heart sounds, S1 normal and S2 normal. No murmur heard.  No friction rub. No gallop.  Pulmonary:     Effort: Pulmonary effort is normal. No tachypnea or respiratory distress.     Breath sounds: Normal breath sounds. No decreased breath sounds, wheezing, rhonchi or rales.  Abdominal:     General: Bowel sounds are normal.     Palpations: Abdomen is soft.     Tenderness: There is no abdominal tenderness.  Musculoskeletal:     Right shoulder: Tenderness present. No swelling, bony tenderness or crepitus. Normal range of motion. Normal strength.     Cervical back: Normal range of motion and neck supple.     Left knee: Crepitus present. Normal range of motion. No LCL laxity, MCL laxity, ACL laxity or PCL laxity.Normal alignment, normal meniscus and normal patellar mobility.     Instability Tests: Anterior drawer test negative. Posterior drawer test negative. Anterior Lachman test negative. Medial McMurray test negative and lateral McMurray test negative.     Comments: Negative Spurling on right, positive Neer's test negative drop arm test  Skin:    General: Skin is warm and dry.     Findings: No rash.  Neurological:     Mental Status: She is alert.  Psychiatric:        Mood and Affect: Mood is not anxious or depressed.        Speech: Speech normal.        Behavior: Behavior normal. Behavior is cooperative.        Thought Content: Thought content normal.        Judgment: Judgment normal.       Results for orders placed or performed during the hospital encounter of 06/26/22  Basic Metabolic Panel (BMET)  Result Value Ref Range   Sodium  140 135 - 145 mmol/L   Potassium 4.1 3.5 - 5.1 mmol/L   Chloride 105 98 - 111 mmol/L   CO2 27 22 - 32 mmol/L   Glucose, Bld 96 70 - 99 mg/dL   BUN 9 6 - 20 mg/dL   Creatinine, Ser 2.13 0.44 - 1.00 mg/dL   Calcium 9.4 8.9 - 08.6 mg/dL   GFR, Estimated >57 >84 mL/min   Anion gap 8 5 - 15    Assessment and Plan  Acute diarrhea Assessment & Plan: Acute, but ongoing 2 months Most consistent with irritable bowel syndrome but some concern for possible infection given leading up to this had GI illness with nausea vomiting and diarrhea. Possibly secondary to Paxil but not clear and patient has had benefit with medication. Will evaluate with labs and stool studies.  Reviewed colonoscopy and past CT scan  She will work on low-fat diet, decrease stress and increase water.  She will start a probiotic and daily fiber supplement.  Orders: -     Comprehensive metabolic panel -     CBC with Differential/Platelet -     Sedimentation rate -     C-reactive protein -     Gastrointestinal Pathogen Pnl RT, PCR  Acute pain of right shoulder Assessment & Plan: Acute, most consistent with rotator cuff tendinitis versus osteoarthritis versus bursitis. Given current GI issues we will hold off on NSAID.  Will treat with home physical therapy for rotator cuff.   Acute pain of left knee Assessment & Plan: Acute, most consistent with osteoarthritis.  Did have flare with redness and swelling now resolved.  Will evaluate with x-ray for evidence of osteoarthritis versus autoimmune joint issue.  Of note joint pain is not symmetric so I am doubtful  of autoimmune involvement of joints. Check sed rate and CRP  Orders: -     DG Knee Complete 4 Views Left; Future    No follow-ups on file.   Kerby Nora, MD

## 2022-11-22 NOTE — Assessment & Plan Note (Signed)
Acute, but ongoing 2 months Most consistent with irritable bowel syndrome but some concern for possible infection given leading up to this had GI illness with nausea vomiting and diarrhea. Possibly secondary to Paxil but not clear and patient has had benefit with medication. Will evaluate with labs and stool studies.  Reviewed colonoscopy and past CT scan  She will work on low-fat diet, decrease stress and increase water.  She will start a probiotic and daily fiber supplement.

## 2022-11-22 NOTE — Patient Instructions (Signed)
Start daily fiber supplement and probiotic.  Work on [employer].  Stop at labs and Xray on way out.  Start right shoulder home PT.

## 2022-11-23 ENCOUNTER — Ambulatory Visit (INDEPENDENT_AMBULATORY_CARE_PROVIDER_SITE_OTHER)
Admission: RE | Admit: 2022-11-23 | Discharge: 2022-11-23 | Disposition: A | Discharge status: Home / Self Care | Payer: 59 | Source: Ambulatory Visit | Attending: Family Medicine | Admitting: Family Medicine

## 2022-11-23 ENCOUNTER — Other Ambulatory Visit: Payer: Self-pay

## 2022-11-23 ENCOUNTER — Other Ambulatory Visit: Payer: Self-pay | Admitting: Radiology

## 2022-11-23 DIAGNOSIS — M25562 Pain in left knee: Secondary | ICD-10-CM | POA: Diagnosis not present

## 2022-11-23 DIAGNOSIS — R197 Diarrhea, unspecified: Secondary | ICD-10-CM | POA: Diagnosis not present

## 2022-11-23 MED ORDER — EZETIMIBE 10 MG PO TABS
10.0000 mg | ORAL_TABLET | Freq: Every day | ORAL | 3 refills | Status: DC
  Filled 2022-11-23: qty 90, 90d supply, fill #0
  Filled 2023-02-25: qty 90, 90d supply, fill #1
  Filled 2023-05-27: qty 90, 90d supply, fill #2
  Filled 2023-08-27: qty 90, 90d supply, fill #3

## 2022-11-23 NOTE — Telephone Encounter (Signed)
Pt made aware of MD's recommendations and verbalized understanding. Zetia 10 mg daily sent to requested pharmacy Lab orders placed   Debbe Odea, MD  Jefferey Pica, RN; [place], Nadara Mustard, CMA; Cv Div Burl Seymour hours ago (6:59 PM)    Molli Knock to start Zetia milligrams daily.  If Repatha not approved by insurance.  Repeat lipid panel in 3 months as previously advised.

## 2022-11-23 NOTE — Telephone Encounter (Signed)
Left a message for the patient to call back.  

## 2022-11-25 LAB — GASTROINTESTINAL PATHOGEN PNL
CampyloBacter Group: NOT DETECTED
Norovirus GI/GII: NOT DETECTED
Rotavirus A: NOT DETECTED
Salmonella species: NOT DETECTED
Shiga Toxin 1: NOT DETECTED
Shiga Toxin 2: NOT DETECTED
Shigella Species: NOT DETECTED
Vibrio Group: NOT DETECTED
Yersinia enterocolitica: NOT DETECTED

## 2022-11-29 DIAGNOSIS — J301 Allergic rhinitis due to pollen: Secondary | ICD-10-CM | POA: Diagnosis not present

## 2022-11-30 ENCOUNTER — Ambulatory Visit (INDEPENDENT_AMBULATORY_CARE_PROVIDER_SITE_OTHER): Payer: 59 | Admitting: Clinical

## 2022-11-30 DIAGNOSIS — F411 Generalized anxiety disorder: Secondary | ICD-10-CM | POA: Diagnosis not present

## 2022-11-30 NOTE — Progress Notes (Signed)
[facility] Counselor/Therapist Progress Note  Patient ID: KAYLEN PELTS, MRN: [id number],    Date: 11/30/2022  Time Spent: 9:38am - 10:23am : 45 minutes   Treatment Type: Individual Therapy  Reported Symptoms: none reported  Mental Status Exam: Appearance:  Neat and Well Groomed     Behavior: Appropriate  Motor: Normal  Speech/Language:  Clear and Coherent  Affect: Appropriate  Mood: normal  Thought process: normal  Thought content:   WNL  Sensory/Perceptual disturbances:   WNL  Orientation: oriented to person, place, and situation  Attention: Good  Concentration: Good  Memory: WNL  Fund of knowledge:  Good  Insight:   Good  Judgment:  Good  Impulse Control: Good   Risk Assessment: Danger to Self:  No Patient denied current suicidal ideation  Self-injurious Behavior: No Danger to Others: No Patient denied current homicidal ideation Duty to Warn:no Physical Aggression / Violence:No  Access to Firearms a concern: No  Gang Involvement:No   Subjective: Patient stated, "pretty good" in response to mood since last session. Patient reported her niece had a stoke last week and patient has been helping care for her niece's daughter. Patient stated, "I've tried to practice going there [facility]) in my mind".  Patient reported she was able to visualize the [facility] and reported she practiced the visualization exercise last night. Patient stated, "good" in response to the outcome of the visualization exercise. Patient stated, "I don't think I really thought about how it made me feel". Patient reported she was relaxed and reported she was trying to clear her mind when practicing visualization exercise. Patient stated, "in thinking about it now, it was successful". Patient reported she has been utilizing deep breathing exercises. Patient reported she finds it difficult "to make time to sit down and make that time in my mind". Patient stated, "not that I could  really think of" when clinician inquired about thought record entries. Patient stated,  "at this point I haven't had any anxiety about it  just sadness" in regards to patient's niece. Patient stated, "Im getting along well with my daughter in law". Patient stated, "mentally I feel like I'm doing well".   Interventions: Cognitive Behavioral Therapy. Clinician conducted session in person at clinician's office at [facility]. Provided supportive therapy, active listening as patient discussed her niece's recent health concerns and recovery. Reviewed patient's use of visualization/imagery since last session and the outcome. Discussed patient's use of deep breathing since last session. Provided psycho education related to mindfulness. Reviewed patient's thought record. Assessed patient's mood. Clinician requested patient continue thought record for homework and utilize socratic questions to challenge thoughts identified and practice visualization/imagery.    Collaboration of Care: Other not required at this time   Diagnosis:  Generalized anxiety disorder     Plan: Patient is to utilize [employer] [facility], thought re-framing, relaxation techniques, mindfulness and coping strategies to decrease symptoms associated with Generalized Anxiety Disorder. Frequency: bi-weekly  Modality: individual      Long-term goal:   Reduce overall level, frequency, and intensity of the feelings of anxiety and panic as evidenced by decrease in panic attacks, anxiety, negative thoughts, feeling defensive, difficulty focusing, feeling on edge, lack of motivation, and feeling unproductive from 3 to 5 days/week to 0 to 1 days/week per patient report for at least 3 consecutive months. Target Date: 09/07/23  Progress: progressing    Short-term goal:  Reduce overall level, frequency, and intensity of feeling overwhelmed, frustration, and irritability in response to  the occurrence of unplanned events Target Date:  03/10/23  Progress: progressing    Identify triggers for feeling overwhelmed, frustrated, and irritable when unplanned events occur and develop/implement strategies to process thoughts/feelings prior to responding.  Target Date: 03/10/23  Progress: progressing    Develop effective communication strategies for patient to utilize when expressing her thoughts and feelings to others in a healthy, controlled and assertive way  Target Date: 03/10/23  Progress: progressing    Verbally express an understanding of the relationship between feelings of anxiety and the impact on thinking patterns and behaviors. Target Date: 03/10/23  Progress: progressing    Identify, challenge, and replace negative thought patterns that contribute to feelings of  anxiety with positive thoughts and beliefs per patient's report Target Date: 03/10/23  Progress: progressing       Doree Barthel, LCSW

## 2022-11-30 NOTE — Progress Notes (Signed)
                Romyn Boswell, LCSW 

## 2022-12-13 ENCOUNTER — Ambulatory Visit (INDEPENDENT_AMBULATORY_CARE_PROVIDER_SITE_OTHER): Payer: 59 | Admitting: Clinical

## 2022-12-13 DIAGNOSIS — F411 Generalized anxiety disorder: Secondary | ICD-10-CM

## 2022-12-13 DIAGNOSIS — J301 Allergic rhinitis due to pollen: Secondary | ICD-10-CM | POA: Diagnosis not present

## 2022-12-13 NOTE — Progress Notes (Signed)
[facility] Counselor/Therapist Progress Note  Patient ID: Joyce Brock, MRN: [id number],    Date: 12/13/2022  Time Spent: 8:34am - 9:30am : 56 minutes   Treatment Type: Individual Therapy  Reported Symptoms: Patient reported recent feelings of irritability  Mental Status Exam: Appearance:  Neat and Well Groomed     Behavior: Appropriate  Motor: Normal  Speech/Language:  Clear and Coherent  Affect: Appropriate  Mood: normal  Thought process: normal  Thought content:   WNL  Sensory/Perceptual disturbances:   WNL  Orientation: oriented to person, place, and situation  Attention: Good  Concentration: Good  Memory: WNL  Fund of knowledge:  Good  Insight:   Good  Judgment:  Good  Impulse Control: Good   Risk Assessment: Danger to Self:  No  Patient denied current suicidal ideation  Self-injurious Behavior: No Danger to Others: No Patient denied current homicidal ideation Duty to Warn:no Physical Aggression / Violence:No  Access to Firearms a concern: No  Gang Involvement:No   Subjective: Patient stated, "pretty good" in response to events since last session. Patient reported she experienced feelings of irritability on Monday. Patient reported she left her phone at home on Monday and a situation at work triggered feelings of anger. Patient reported she has been challenging recent feelings of irritability and anger. Patient reported feeling angry about a recent change in meeting date/time at work and reported feeling "unimportant" in response. Patient stated, "it was hurtful" and reported she felt others' schedules were not taken into consideration when the meeting was scheduled. Patient reported feeling her opinion does not matter in various areas of her life. Patient reported she has decided to find a new church and stated, "that's weighing on my mind". Patient reported she recently felt irritated when she arrived at her son's home to watch her granddaughter and  her daughter in law's mother was at her son's home.  Patient reported she feels she needs to start putting herself in her daughter in law's mother's position and view the situation from her perspective. Patient reported she feels she is going to have to fight for time with her granddaughter. Patient reported she feels her daughter in law's mother is allowed to stop by without notice but patient feels she has to ask permission to visit. Patient reported she has not discussed her feelings with her son, and reported she does not want her son to be put in the middle or feel like he has to choose between his mother and his wife. Patient reported she wrote a letter to her daughter in law regarding her thoughts/feelings and reported she left the letter open ended at this time. Patient stated, "I'm good today" in response to current mood.  Interventions: Cognitive Behavioral Therapy and Interpersonal. Clinician conducted session in person at clinician's office at [facility]. Reviewed events since last session. Explored and identified recent triggers for irritability and anger. Assisted patient in exploring patient's thoughts and feelings associated with recent triggers of irritability and anger. Explored barriers to patient discussing her feelings with her son. Reviewed that status of patient's letter to her daughter in law. Provided reflective listening and validation. Clinician requested patient continue thought record for homework and utilize socratic questions to challenge thoughts identified and practice visualization/imagery.    Collaboration of Care: Other not required at this time   Diagnosis:  Generalized anxiety disorder     Plan: Patient is to utilize Cognitive Behavioral Therapy, thought re-framing, relaxation techniques, mindfulness and coping strategies to decrease  symptoms associated with Generalized Anxiety Disorder. Frequency: bi-weekly  Modality: individual      Long-term goal:    Reduce overall level, frequency, and intensity of the feelings of anxiety and panic as evidenced by decrease in panic attacks, anxiety, negative thoughts, feeling defensive, difficulty focusing, feeling on edge, lack of motivation, and feeling unproductive from 3 to 5 days/week to 0 to 1 days/week per patient report for at least 3 consecutive months. Target Date: 09/07/23  Progress: progressing    Short-term goal:  Reduce overall level, frequency, and intensity of feeling overwhelmed, frustration, and irritability in response to the occurrence of unplanned events Target Date: 03/10/23  Progress: progressing    Identify triggers for feeling overwhelmed, frustrated, and irritable when unplanned events occur and develop/implement strategies to process thoughts/feelings prior to responding.  Target Date: 03/10/23  Progress: progressing    Develop effective communication strategies for patient to utilize when expressing her thoughts and feelings to others in a healthy, controlled and assertive way  Target Date: 03/10/23  Progress: progressing    Verbally express an understanding of the relationship between feelings of anxiety and the impact on thinking patterns and behaviors. Target Date: 03/10/23  Progress: progressing    Identify, challenge, and replace negative thought patterns that contribute to feelings of  anxiety with positive thoughts and beliefs per patient's report Target Date: 03/10/23  Progress: progressing     Doree Barthel, LCSW

## 2022-12-13 NOTE — Progress Notes (Signed)
                Geneive Sandstrom, LCSW 

## 2022-12-20 DIAGNOSIS — J301 Allergic rhinitis due to pollen: Secondary | ICD-10-CM | POA: Diagnosis not present

## 2022-12-27 DIAGNOSIS — J301 Allergic rhinitis due to pollen: Secondary | ICD-10-CM | POA: Diagnosis not present

## 2022-12-28 ENCOUNTER — Ambulatory Visit (INDEPENDENT_AMBULATORY_CARE_PROVIDER_SITE_OTHER): Payer: 59 | Admitting: Clinical

## 2022-12-28 DIAGNOSIS — F411 Generalized anxiety disorder: Secondary | ICD-10-CM | POA: Diagnosis not present

## 2022-12-28 NOTE — Progress Notes (Signed)
                Karen Sharpe, LCSW 

## 2022-12-28 NOTE — Progress Notes (Signed)
[facility] Counselor/Therapist Progress Note  Patient ID: Joyce Brock, MRN: [id number],    Date: 12/28/2022  Time Spent: 12:34pm - 1:35pm : 61 minutes   Treatment Type: Individual Therapy  Reported Symptoms: Patient reported feeling anxious about balloon ride  Mental Status Exam: Appearance:  Neat and Well Groomed     Behavior: Appropriate  Motor: Normal  Speech/Language:  Clear and Coherent  Affect: Appropriate  Mood: normal  Thought process: normal  Thought content:   WNL  Sensory/Perceptual disturbances:   WNL  Orientation: oriented to person, place, and situation  Attention: Fair  Concentration: Good  Memory: WNL  Fund of knowledge:  Good  Insight:   Good  Judgment:  Good  Impulse Control: Good   Risk Assessment: Danger to Self:  No Patient denied current suicidal ideation  Self-injurious Behavior: No Danger to Others: No Patient denied current homicidal ideation Duty to Warn:no Physical Aggression / Violence:No  Access to Firearms a concern: No  Gang Involvement:No   Subjective: Patient stated, "good" in response to events since last session. Patient reported her father fell last week but is doing well now. Patient reported she did not experience negative thoughts or "what if" thoughts in response to her father's fall. Patient reported she has practiced visualization/imagery exercises since last session and has increased the time spent practices the exercises. Patient stated, "It did help me focus on that" in regards to the exercise. Patient reported she recently had dinner with her daughter to show her support in response to patient's daughter ending a friendship. Patient reported her daughter disclosed to patient that she feels she may have experienced some anxiety. Patient reported she shared with her daughter some of the coping strategies patient has implemented in response to anxiety. Patient reported she has been spending time with her  granddaughter and stated, "it's going good". Patient reported a decrease in worry related to patient's ability to spend time with her granddaughter. Patient reported patient/husband are planning to take a hot air balloon ride. Patient reported they have tried multiple times to take a hot air balloon ride but the weather did not allow the ride to take place. Patient reported she has not experienced negative thoughts in response to the upcoming balloon ride but reported feeling anxious about the ride. Patient stated, "even, content", "happy" in response to patient's mood today.   Interventions: Cognitive Behavioral Therapy. Clinician conducted session in person at clinician's office at [facility]. Reviewed events since last session. Discussed decrease in negative thoughts and cognitive distortions. Reviewed patient's implementation of visualization/imagery and the outcome. Provided psycho education related to visualization/imagery. Discussed patient's thoughts and feelings related to patient's daughter recently disclosing her experience with anxiety during conversation with patient. Challenged thought distortions associated with patient's fear her daughter in law's mother moving to the area will impact patient's ability to see granddaughter. Provided psycho education related mindfulness exercise of leaves on a stream. Assessed patient's current mood. Clinician requested patient continue thought record for homework and utilize socratic questions to challenge thoughts identified and practice visualization/imagery.   Collaboration of Care: Other not required at this time   Diagnosis:  Generalized anxiety disorder     Plan: Patient is to utilize [employer] Therapy, thought re-framing, relaxation techniques, mindfulness and coping strategies to decrease symptoms associated with Generalized Anxiety Disorder. Frequency: bi-weekly  Modality: individual      Long-term goal:   Reduce overall  level, frequency, and intensity of the feelings of anxiety and panic  as evidenced by decrease in panic attacks, anxiety, negative thoughts, feeling defensive, difficulty focusing, feeling on edge, lack of motivation, and feeling unproductive from 3 to 5 days/week to 0 to 1 days/week per patient report for at least 3 consecutive months. Target Date: 09/07/23  Progress: progressing    Short-term goal:  Reduce overall level, frequency, and intensity of feeling overwhelmed, frustration, and irritability in response to the occurrence of unplanned events Target Date: 03/10/23  Progress: progressing    Identify triggers for feeling overwhelmed, frustrated, and irritable when unplanned events occur and develop/implement strategies to process thoughts/feelings prior to responding.  Target Date: 03/10/23  Progress: progressing    Develop effective communication strategies for patient to utilize when expressing her thoughts and feelings to others in a healthy, controlled and assertive way  Target Date: 03/10/23  Progress: progressing    Verbally express an understanding of the relationship between feelings of anxiety and the impact on thinking patterns and behaviors. Target Date: 03/10/23  Progress: progressing    Identify, challenge, and replace negative thought patterns that contribute to feelings of  anxiety with positive thoughts and beliefs per patient's report Target Date: 03/10/23  Progress: progressing    Doree Barthel, LCSW

## 2023-01-03 DIAGNOSIS — J301 Allergic rhinitis due to pollen: Secondary | ICD-10-CM | POA: Diagnosis not present

## 2023-01-17 ENCOUNTER — Ambulatory Visit: Payer: 59 | Admitting: Clinical

## 2023-01-17 DIAGNOSIS — J301 Allergic rhinitis due to pollen: Secondary | ICD-10-CM | POA: Diagnosis not present

## 2023-01-29 ENCOUNTER — Encounter: Payer: Self-pay | Admitting: Family Medicine

## 2023-01-29 DIAGNOSIS — M25511 Pain in right shoulder: Secondary | ICD-10-CM

## 2023-01-30 DIAGNOSIS — J301 Allergic rhinitis due to pollen: Secondary | ICD-10-CM | POA: Diagnosis not present

## 2023-01-31 DIAGNOSIS — J301 Allergic rhinitis due to pollen: Secondary | ICD-10-CM | POA: Diagnosis not present

## 2023-01-31 NOTE — Therapy (Unsigned)
OUTPATIENT PHYSICAL THERAPY EVALUATION   Patient Name: Joyce Brock MRN: [id number] DOB:November 15, 1965, 57 y.o., female Today's Date: 02/01/2023  END OF SESSION:  PT End of Session - 02/01/23 1627     Visit Number 1    Number of Visits 13    Date for PT Re-Evaluation 04/26/23    Authorization Type [place] [employer] S[employer] reporting period from 02/01/2023    Authorization Time Period *only 4 modalities per visit*  VL 25 PT/OT combined  medial review after 25th visit    Authorization - Visit Number 1    Authorization - Number of Visits 25    Progress Note Due on Visit 10    PT Start Time 1435    PT Stop Time 1530    PT Time Calculation (min) 55 min    Activity Tolerance Patient tolerated treatment well    Behavior During Therapy WFL for tasks assessed/performed             Past Medical History:  Diagnosis Date   Allergy    Anxiety    GERD (gastroesophageal reflux disease)    Hyperlipidemia    Migraines    Past Surgical History:  Procedure Laterality Date   CHOLECYSTECTOMY     Patient Active Problem List   Diagnosis Date Noted   Acute diarrhea 11/22/2022   Acute pain of right shoulder 11/22/2022   Acute pain of left knee 11/22/2022   Tingling in extremities 05/11/2022   GERD (gastroesophageal reflux disease) 01/16/2020   Tachycardia    Vasovagal syncope 01/11/2020   Pulmonary nodule    GAD (generalized anxiety disorder) 10/04/2018   Panic attack 10/04/2018   Atopic dermatitis 01/18/2017   Verruca vulgaris 11/15/2012   Rectal pain 03/29/2012   Palpitations 12/26/2011   HYPERCHOLESTEROLEMIA 02/05/2009   ALLERGIC RHINITIS 10/30/2008   PSORIASIS 10/30/2008   URINARY INCONTINENCE, MIXED 10/30/2008    PCP: Excell Seltzer, MD  REFERRING PROVIDER: Excell Seltzer, MD  REFERRING DIAG: acute pain of right shoulder  THERAPY DIAG:  Chronic right shoulder pain  Pain in right upper arm  Stiffness of right shoulder, not elsewhere classified  Muscle weakness  (generalized)  Rationale for Evaluation and Treatment: Rehabilitation  ONSET DATE: 6 months or more from PT evaluation  SUBJECTIVE:                                                                                                                                                                                      SUBJECTIVE STATEMENT: Patient states she is having some arm pain in her right triceps region, but her doctor thought it was related to her rotator cuff. Her doctor gave  her an exercise handout with about 12 exercises on it. She did them for about 10 minutes and some would make them hurt and others would be okay. Her daughter is an OT and looked at the exercises and said some of them are doing the same thing. She states her arm pain is getting worse over time. She did not continue to do the exercises when they hurt because she was worried she would hurt herself more. She stats this pain has been there at least 6 months, maybe more. Her pain was random at the beginning and she thought it might be related to the allergy shots she got. Then she could tell it was getting worse. She has not tried any injections or medications or other treatments. She used to sleep with her hand up under the pillow, which she stopped doing that. It will hurt when she gets up in the mornings so she puts ice on it. She changed her sleeping position because it was hurting. She denies history of neck pain or current neck pain. Denies numbness tingling weakness in her right hand.   Sharp ain calms down in about 10 seconds after it is provoked. .   Hand dominance: Right  PERTINENT HISTORY: Patient is a 57 y.o. female who presents to outpatient physical therapy with a referral for medical diagnosis acute pain of right shoulder. This patient's chief complaints consist of pain over the right posterior arm and lateral arm, difficulty using right arm, leading to the following functional deficits: difficulty working, using the  computer mouse, reaching, dressing, pickleball, grooming, pushing, making the bed, housework, taking impact through the right UE, reaching (out, behind body, to back seat), sleeping. Relevant past medical history and comorbidities include has HYPERCHOLESTEROLEMIA; ALLERGIC RHINITIS; PSORIASIS; URINARY INCONTINENCE, MIXED; Palpitations; Rectal pain; Verruca vulgaris; Atopic dermatitis; GAD (generalized anxiety disorder); Panic attack; Vasovagal syncope; Pulmonary nodule; Tachycardia; GERD (gastroesophageal reflux disease); Tingling in extremities; Acute diarrhea; Acute pain of right shoulder; and Acute pain of left knee on their problem list. She  has a past medical history of Allergy, Anxiety, GERD (gastroesophageal reflux disease), Hyperlipidemia, and Migraines. She has a past surgical history that includes Cholecystectomy. Patient denies hx of cancer, stroke, seizures, lung problems, heart problems, diabetes, unexplained weight loss, unexplained changes in bowel or bladder problems, unexplained stumbling or dropping things, osteoporosis, and spinal surgery  PAIN:  Are you having pain? Yes NPRS: Current: 0/10,  Best: 0/10, Worst: 9/10. Pain location: posterior shoulder and over triceps to elbow and not proximal to acromion.  Pain description: numb and tingly (starts out numb), intense when it is stretched, just hurt, very sore, intermittent, electrical jolt, tiring.  Aggravating factors: sleeping on her arm, quick movements, reaching overhead, reaching behind back, putting on her bra, any jolting motion into the joint, pushing, mornings.  Relieving factors: ice, stopping the movement, not using it.   FUNCTIONAL LIMITATIONS: difficulty working, using the computer mouse, reaching, dressing, pickleball, grooming, pushing, making the bed, housework, taking impact through the right UE, reaching (out, behind body, to back seat), sleeping.   LEISURE: pickleball, going to the gym (elliptical, treadmill, or bike  for about 20 min 3x a week, has not been in 2 months due to stomach bug and vacations), crafting, painting  PRECAUTIONS: None   WEIGHT BEARING RESTRICTIONS: No  FALLS:  Has patient fallen in last 6 months? 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   OBJECTIVE  DIAGNOSTIC FINDINGS:  No known recent imaging  SELF- REPORTED FUNCTION FOTO score: 61/100 (shoulder questionnaire)  OBSERVATION/INSPECTION Posture Posture (seated): mildly forward head, rounded shoulders,  Anthropometrics Tremor: none Body composition: BMI: 25.5 Muscle bulk: WNL Edema: none Functional Mobility Transfers: sit <> stand Gait: grossly WFL for household and short community ambulation. More detailed gait analysis deferred to later date as needed.   NEUROLOGICAL Dermatomes C2-T1 appears equal and intact to light touch.   SPINE MOTION  CERVICAL SPINE AROM (with OP) *Indicates pain Flexion: 64 Extension: 58 mostly upper cervical spine motion Side Flexion:   R 40  L 30 Rotation:  R 65 L 65  PERIPHERAL JOINT MOTION (in degrees) ACTIVE RANGE OF MOTION (AROM) *Indicates pain 02/01/23 Date Date  Joint/Motion R/L R/L R/L  Shoulder     Flexion 134*/176 / /  Extension 60*/70 / /  Abduction  90*/180 / /  External rotation 85*/119 / /  Internal rotation L5*/T3 / /  Comments:  02/01/2023: B elbows, wrists, and hands grossly WFL for basic activities without concordant or any pain.   MUSCLE PERFORMANCE (MMT):  *Indicates pain 02/01/23 Date Date  Joint/Motion R/L R/L R/L  Shoulder     Flexion 4+*/5 / /  Abduction (C5) 4+*/5 / /  External rotation 4*/4 / /  Internal rotation 5*/5 / /  Elbow     Flexion (C6) 5/5 / /  Extension (C7) 4+/5 / /   Wrist     Flexion (C7) 4/5 / /  Extension (C6) 4+/5 / /  Hand     Thumb extension (C8) B WFL / /  Finger abduction (T1) B WFL / /  Comments:   SPECIAL TESTS: CERVICAL SPINE Cervical spine axial compression: negative Spurling's part B:  R = negative, L = negative   SHOULDER SPECIAL TESTS Painful arc test: R = unable to reach over 134 degrees.  Drop arm test: R = negative Hawkins-Kennedy test: R = positive, L = positive.  Infraspinatus test: R = positive  UPPER LIMB NEURODYNAMIC TESTS (AROM or  AAROM) Upper Limb Tension Test 1 (ULTT1, Median nerve bias, Magee-ULTT1):  R = positive, sensitive to elbow and wrist position but not head position.  L  = negative (Seated AAROM)  Upper Limb Tension Test 2B (ULTT2B, Radial nerve bias, Magee-ULTT3):  R  = positive, sensitive to hand position, not neck or elbow position.   L = negative  Upper Limb Tension Test 3 (ULTT3, Ulnar nerve bias, Magee-ULTT4):  R = negative  L = negative  ACCESSORY MOTION: CPA to cervical spine with no reproduction of symptoms or concordant or non-concordant pain.  Distraction to right shoulder joint mildly pain relieving.   PALPATION: Not TTP over soft tissue of right shoulder girdle    TODAY'S TREATMENT:  Therapeutic exercise: to centralize symptoms and improve ROM, strength, muscular endurance, and activity tolerance required for successful completion of functional activities.  - standing isometric ER with towel roll under arm, 1x5 with 4 second holds.  - Standing rows with scapular retraction for improved postural and shoulder girdle strengthening and mobility. Required instruction for technique and cuing to retract, posteriorly tilt, and depress scapulae. 2x10 at 15# cable and BlueTB - Standing AAROM R shoulder flexion  and abduction wall slide, 1x2-3 each  direction with 5-10 second hold.  - Education on diagnosis, prognosis, POC, anatomy and physiology of current condition.  - Education on HEP  including handout   Pt required multimodal cuing for proper technique and to facilitate improved neuromuscular control, strength, range of motion, and functional ability resulting in improved performance and form.   PATIENT EDUCATION: Education details: Exercise purpose/form. Self management techniques. Education on diagnosis, prognosis, POC, anatomy and physiology of current condition. Education on HEP including handout. Person educated: Patient Education method: Explanation, Demonstration, Tactile cues, Verbal cues, and Handouts Education comprehension: verbalized understanding, returned demonstration, and needs further education  HOME EXERCISE PROGRAM: Access Code: [email or web address] URL: [email or web address] Date: 02/01/2023 Prepared by: Norton Blizzard  Exercises - Row with band/cable  - 1 x daily - 3 sets - 10 reps - 2 seconds hold - Standing Isometric Shoulder External Rotation with Doorway and Towel Roll  - 1 x daily - 1-2 sets - 10 reps - 4 seconds hold - Standing Isometric Shoulder Internal Rotation with Towel Roll at Doorway  - 1 x daily - 1-2 sets - 10 reps - 4 seconds hold - Standing Shoulder Abduction Slides at Wall  - 1 x daily - 1-2 sets - 10 reps - 4 seconds hold - Scaption Wall Slide with Towel  - 1 x daily - 1-2 sets - 10 reps - seconds hold  ASSESSMENT:  CLINICAL IMPRESSION: Patient is a 57 y.o. female referred to outpatient physical therapy with a medical diagnosis of acute pain of right shoulder who presents with signs and symptoms consistent with right rotator cuff related pain. Unable to reproduce concordant symptoms with testing of the cervical spine. Pattern of signs and symptoms including slight compensatory shoulder hike with elevation most closely resembles rotator cuff related pain. Patient presents with significant pain, ROM, joint stiffness, motor control, neural tension, muscle performance (strength/power/endurance), and activity tolerance impairments that  are limiting ability to complete difficulty working, using the computer mouse, reaching, dressing, pickleball, grooming, pushing, making the bed, housework, taking impact through the right UE, reaching (out, behind body, to back seat), sleeping without difficulty. Patient will benefit from skilled physical therapy intervention to address current body structure impairments and activity limitations to improve function and work towards goals set in current POC in order to return to prior level of function or maximal functional improvement.   OBJECTIVE IMPAIRMENTS: decreased activity tolerance, decreased coordination, decreased endurance, decreased knowledge of condition, difficulty walking, decreased ROM, decreased strength, hypomobility, impaired perceived functional ability, increased muscle spasms, impaired flexibility, impaired UE functional use, improper body mechanics, and pain.   ACTIVITY LIMITATIONS: carrying, lifting, sleeping, bed mobility, bathing, dressing, reach over head, and caring for others  PARTICIPATION LIMITATIONS: meal prep, cleaning, laundry, community activity, occupation, yard work, and   difficulty working, using the Firefighter, reaching, dressing, pickleball, grooming, pushing, making the bed, housework, taking impact through the right UE, reaching (out, behind body, to back seat), sleeping  PERSONAL FACTORS: Age, Past/current experiences, Time since onset of injury/illness/exacerbation, and 3+ comorbidities:   HYPERCHOLESTEROLEMIA; ALLERGIC RHINITIS; PSORIASIS; URINARY INCONTINENCE, MIXED; Palpitations; Rectal pain; Verruca vulgaris; Atopic dermatitis; GAD (generalized anxiety disorder); Panic attack; Vasovagal syncope; Pulmonary nodule; Tachycardia; GERD (gastroesophageal reflux disease); Tingling in extremities; Acute diarrhea; Acute pain of right shoulder; and Acute pain of left knee on their problem list. She  has a past medical history of Allergy, Anxiety, GERD  (gastroesophageal reflux disease), Hyperlipidemia, and Migraines are also affecting patient's functional outcome.   REHAB POTENTIAL: Good  CLINICAL DECISION  MAKING: Evolving/moderate complexity  EVALUATION COMPLEXITY: Moderate   GOALS: Goals reviewed with patient? No  SHORT TERM GOALS: Target date: 02/15/2023  Patient will be independent with initial home exercise program for self-management of symptoms. Baseline: Initial HEP provided at IE (02/01/23); Goal status: INITIAL   LONG TERM GOALS: Target date: 04/26/2023  Patient will be independent with a long-term home exercise program for self-management of symptoms.  Baseline: Initial HEP provided at IE (02/01/23); Goal status: INITIAL  2.  Patient will demonstrate improved FOTO to equal or greater than 68 by visit #10 to demonstrate improvement in overall condition and self-reported functional ability.  Baseline: 61 (02/01/23); Goal status: INITIAL  3.  Patient will demonstrate R shoulder AROM equal or greater than L shoulder AROM to improve her ability to complete daily activities such as working, dressing, and playing pickleball.  Baseline: significant limitations - see objective (02/01/23); Goal status: INITIAL  4.  Patient will demonstrate R shoulder MMT equal or greater than L shoulder MMT with no increase in concordant pain to improve her ability to complete valued activities such as dressing, reaching, pushing, working, and pickleball.  Baseline: significant limitations (02/01/23); Goal status: INITIAL  5.  Patient will complete community, work and/or recreational activities with 75% less limitation due to current condition.  Baseline: difficulty working, using the computer mouse, reaching, dressing, pickleball, grooming, pushing, making the bed, housework, taking impact through the right UE, reaching (out, behind body, to back seat), sleeping (02/01/23); Goal status: INITIAL  PLAN:  PT FREQUENCY: 1-2x/week  PT  DURATION: 12 weeks  PLANNED INTERVENTIONS: Therapeutic exercises, Therapeutic activity, Neuromuscular re-education, Patient/Family education, Self Care, Joint mobilization, Dry Needling, Electrical stimulation, Spinal mobilization, Cryotherapy, Moist heat, Manual therapy, and Re-evaluation  PLAN FOR NEXT SESSION: update HEP as appropriate, progressive shoulder girdle/UE/postural/functional strengthening and ROM exercises as tolerated, manual therapy and dry needling as needed. Education. Consider nerve glides.    Cira Rue, PT, DPT 02/01/2023, 9:07 PM

## 2023-02-01 ENCOUNTER — Ambulatory Visit: Payer: 59 | Admitting: Clinical

## 2023-02-01 ENCOUNTER — Ambulatory Visit: Payer: 59 | Attending: Family Medicine | Admitting: Physical Therapy

## 2023-02-01 ENCOUNTER — Encounter: Payer: Self-pay | Admitting: Physical Therapy

## 2023-02-01 DIAGNOSIS — G8929 Other chronic pain: Secondary | ICD-10-CM | POA: Diagnosis not present

## 2023-02-01 DIAGNOSIS — M25511 Pain in right shoulder: Secondary | ICD-10-CM | POA: Diagnosis not present

## 2023-02-01 DIAGNOSIS — F411 Generalized anxiety disorder: Secondary | ICD-10-CM | POA: Diagnosis not present

## 2023-02-01 DIAGNOSIS — M79621 Pain in right upper arm: Secondary | ICD-10-CM | POA: Insufficient documentation

## 2023-02-01 DIAGNOSIS — M25611 Stiffness of right shoulder, not elsewhere classified: Secondary | ICD-10-CM | POA: Insufficient documentation

## 2023-02-01 DIAGNOSIS — M6281 Muscle weakness (generalized): Secondary | ICD-10-CM | POA: Diagnosis not present

## 2023-02-01 NOTE — Progress Notes (Signed)
                Karen Sharpe, LCSW 

## 2023-02-01 NOTE — Progress Notes (Signed)
[facility] Counselor/Therapist Progress Note  Patient ID: Joyce Brock, MRN: [id number],    Date: 02/01/2023  Time Spent: 10:35am - 11:22am : 47 minutes   Treatment Type: Individual Therapy  Reported Symptoms: Patient reported recent feelings of anxiety  Mental Status Exam: Appearance:  Neat and Well Groomed     Behavior: Appropriate  Motor: Normal  Speech/Language:  Clear and Coherent  Affect: Appropriate  Mood: normal  Thought process: normal  Thought content:   WNL  Sensory/Perceptual disturbances:   WNL  Orientation: oriented to person, place, and situation  Attention: Good  Concentration: Good  Memory: WNL  Fund of knowledge:  Good  Insight:   Good  Judgment:  Good  Impulse Control: Good   Risk Assessment: Danger to Self:  No Patient denied current suicidal ideation  Self-injurious Behavior: No Danger to Others: No Patient denied current homicidal ideation Duty to Warn:no Physical Aggression / Violence:No  Access to Firearms a concern: No  Gang Involvement:No   Subjective: Patient stated, "good" in response to events since last session. Patient reported she had to cancel her last therapy appointment due to COVID. Patient reported she experienced feelings of anxiety prior to recent hot air balloon ride. Patient stated, "I couldn't figure out which tool in my toolbox to use"  in response to feelings of anxiety. Patient reported she utilized deep breathing and practiced the mindfulness exercise of leaves on a stream. Patient reported patient/husband were staying 45 minutes away from the location of the hot air balloon ride. Patient reported their schedule of when they were going to take the hot air balloon ride was changed due to weather and patient reported the anticipation triggered feelings of anxiety. Patient reported she practiced the use of imagery in response to feelings of anxiety prior to balloon ride.  Patient stated, "the not knowing what to  expect" was a trigger for feelings of anxiety. Patient stated, "it was fun" in response to the experience of the hot air balloon ride and stated, "I'm glad I did it". Patient reported recent their family vacation to the beach went well. Patient reported she became upset with her daughter in law during the trip and stated, "I was able to talk through it". Patient reported she was upset that she did not see her granddaughter's first reaction to the beach. Patient reported she told her daughter in law how she felt and reported her daughter in law later apologized. Patient reported during their trip to the beach an individual committed suicide at the place patient/family was staying. Patient stated, "it has kind of lingered on in my mind about that" in regards to the individual who committed suicide. Patient reported she has been practicing mindfulness 2-3 times per week. Patient reported her daughter is going to [place] for three weeks and reported feeling she may worry about her daughter during the trip. Patient stated, "good" in response to patient's current mood.  Interventions: Cognitive Behavioral Therapy. Clinician conducted session in person at clinician's office at [facility]. Reviewed events since last session. Discussed recent feelings of anxiety and coping skills patient implemented in response. Assisted patient in exploring and identifying triggers for recent feelings of anxiety. Discussed patient's recent interaction with her daughter in law and patient's response. Reviewed the use of deep breathing exercises, imagery, and mindfulness exercises as coping skills. Provided psycho education related to guided meditation and progressive muscle relaxation. Assessed patient's current mood. Provided reflective listening and validation.  Clinician requested patient continue thought  record for homework and utilize socratic questions to challenge thoughts identified and practice deep breathing,  visualization/imagery, mindfulness.    Collaboration of Care: Other not required at this time   Diagnosis:  Generalized anxiety disorder     Plan: Patient is to utilize [employer], thought re-framing, relaxation techniques, mindfulness and coping strategies to decrease symptoms associated with Generalized Anxiety Disorder. Frequency: bi-weekly  Modality: individual      Long-term goal:   Reduce overall level, frequency, and intensity of the feelings of anxiety and panic as evidenced by decrease in panic attacks, anxiety, negative thoughts, feeling defensive, difficulty focusing, feeling on edge, lack of motivation, and feeling unproductive from 3 to 5 days/week to 0 to 1 days/week per patient report for at least 3 consecutive months. Target Date: 09/07/23  Progress: progressing    Short-term goal:  Reduce overall level, frequency, and intensity of feeling overwhelmed, frustration, and irritability in response to the occurrence of unplanned events Target Date: 03/10/23  Progress: progressing    Identify triggers for feeling overwhelmed, frustrated, and irritable when unplanned events occur and develop/implement strategies to process thoughts/feelings prior to responding.  Target Date: 03/10/23  Progress: progressing    Develop effective communication strategies for patient to utilize when expressing her thoughts and feelings to others in a healthy, controlled and assertive way  Target Date: 03/10/23  Progress: progressing    Verbally express an understanding of the relationship between feelings of anxiety and the impact on thinking patterns and behaviors. Target Date: 03/10/23  Progress: progressing    Identify, challenge, and replace negative thought patterns that contribute to feelings of  anxiety with positive thoughts and beliefs per patient's report Target Date: 03/10/23  Progress: progressing       Doree Barthel, LCSW

## 2023-02-05 ENCOUNTER — Encounter: Payer: Self-pay | Admitting: Physical Therapy

## 2023-02-05 ENCOUNTER — Ambulatory Visit: Payer: 59 | Admitting: Physical Therapy

## 2023-02-05 VITALS — BP 126/81 | HR 64

## 2023-02-05 DIAGNOSIS — M6281 Muscle weakness (generalized): Secondary | ICD-10-CM | POA: Diagnosis not present

## 2023-02-05 DIAGNOSIS — M25611 Stiffness of right shoulder, not elsewhere classified: Secondary | ICD-10-CM | POA: Diagnosis not present

## 2023-02-05 DIAGNOSIS — M79621 Pain in right upper arm: Secondary | ICD-10-CM | POA: Diagnosis not present

## 2023-02-05 DIAGNOSIS — G8929 Other chronic pain: Secondary | ICD-10-CM

## 2023-02-05 DIAGNOSIS — M25511 Pain in right shoulder: Secondary | ICD-10-CM | POA: Diagnosis not present

## 2023-02-05 NOTE — Therapy (Signed)
OUTPATIENT PHYSICAL THERAPY TREATMENT   Patient Name: Joyce Brock MRN: [id number] DOB:1965/07/10, 57 y.o., female Today's Date: 02/05/2023  END OF SESSION:  PT End of Session - 02/05/23 0914     Visit Number 2    Number of Visits 13    Date for PT Re-Evaluation 04/26/23    Authorization Type [place] [employer] S[employer] reporting period from 02/01/2023    Authorization Time Period *only 4 modalities per visit*  VL 25 PT/OT combined  medial review after 25th visit    Authorization - Visit Number 2    Authorization - Number of Visits 25    Progress Note Due on Visit 10    PT Start Time 0903    PT Stop Time 0944    PT Time Calculation (min) 41 min    Activity Tolerance Patient tolerated treatment well    Behavior During Therapy [facility] for tasks assessed/performed              Past Medical History:  Diagnosis Date   Allergy    Anxiety    GERD (gastroesophageal reflux disease)    Hyperlipidemia    Migraines    Past Surgical History:  Procedure Laterality Date   CHOLECYSTECTOMY     Patient Active Problem List   Diagnosis Date Noted   Acute diarrhea 11/22/2022   Acute pain of right shoulder 11/22/2022   Acute pain of left knee 11/22/2022   Tingling in extremities 05/11/2022   GERD (gastroesophageal reflux disease) 01/16/2020   Tachycardia    Vasovagal syncope 01/11/2020   Pulmonary nodule    GAD (generalized anxiety disorder) 10/04/2018   Panic attack 10/04/2018   Atopic dermatitis 01/18/2017   Verruca vulgaris 11/15/2012   Rectal pain 03/29/2012   Palpitations 12/26/2011   HYPERCHOLESTEROLEMIA 02/05/2009   ALLERGIC RHINITIS 10/30/2008   PSORIASIS 10/30/2008   URINARY INCONTINENCE, MIXED 10/30/2008    PCP: Excell Seltzer, MD  REFERRING PROVIDER: Excell Seltzer, MD  REFERRING DIAG: acute pain of right shoulder  THERAPY DIAG:  Chronic right shoulder pain  Pain in right upper arm  Stiffness of right shoulder, not elsewhere classified  Muscle weakness  (generalized)  Rationale for Evaluation and Treatment: Rehabilitation  ONSET DATE: 6 months or more from PT evaluation  PERTINENT HISTORY: Patient is a 57 y.o. female who presents to outpatient physical therapy with a referral for medical diagnosis acute pain of right shoulder. This patient's chief complaints consist of pain over the right posterior arm and lateral arm, difficulty using right arm, leading to the following functional deficits: difficulty working, using the computer mouse, reaching, dressing, pickleball, grooming, pushing, making the bed, housework, taking impact through the right UE, reaching (out, behind body, to back seat), sleeping. Relevant past medical history and comorbidities include has HYPERCHOLESTEROLEMIA; ALLERGIC RHINITIS; PSORIASIS; URINARY INCONTINENCE, MIXED; Palpitations; Rectal pain; Verruca vulgaris; Atopic dermatitis; GAD (generalized anxiety disorder); Panic attack; Vasovagal syncope; Pulmonary nodule; Tachycardia; GERD (gastroesophageal reflux disease); Tingling in extremities; Acute diarrhea; Acute pain of right shoulder; and Acute pain of left knee on their problem list. She  has a past medical history of Allergy, Anxiety, GERD (gastroesophageal reflux disease), Hyperlipidemia, and Migraines. She has a past surgical history that includes Cholecystectomy. Patient denies hx of cancer, stroke, seizures, lung problems, heart problems, diabetes, unexplained weight loss, unexplained changes in bowel or bladder problems, unexplained stumbling or dropping things, osteoporosis, and spinal surgery  SUBJECTIVE:  SUBJECTIVE STATEMENT: Patient states she is feeling well today. She states her HEP went well except for abduction wall slides, which were so painful she only did one set. She states the pain  settled pretty quickly after she was done.   PAIN:  NPRS: 0/10   PATIENT GOALS: "to be able to do without the hurting and not damaging to the point I would need surgery or anything like that"  NEXT MD VISIT: no follow up for this specific problem, physical in October.   OBJECTIVE   Vitals:   02/05/23 0914  BP: 126/81  Pulse: 64  SpO2: 98%    TODAY'S TREATMENT:  Therapeutic exercise: to centralize symptoms and improve ROM, strength, muscular endurance, and activity tolerance required for successful completion of functional activities.  - vitals check for safety screening.  - Upper body ergometer level 7 to encourage joint nutrition, warm tissue, induce analgesic effect of aerobic exercise, improve muscular strength and endurance,  and prepare for remainder of session.5 min total changing directions every 1 minute.  - Standing rows with scapular retraction for improved postural and shoulder girdle strengthening and mobility. Required instruction for technique and cuing to retract, posteriorly tilt, and depress scapulae. 3x10 at BlackTB - standing R shoulder ER/IR with red/black TB and towel roll under arm, 3x15 each direction.  - Standing AAROM R shoulder flexion  and abduction wall slide, 1x2-3 each direction with 5-10 second hold.  - Education on HEP including handout   Pt required multimodal cuing for proper technique and to facilitate improved neuromuscular control, strength, range of motion, and functional ability resulting in improved performance and form.   PATIENT EDUCATION: Education details: Exercise purpose/form. Self management techniques. Education on diagnosis, prognosis, POC, anatomy and physiology of current condition. Education on HEP including handout. Person educated: Patient Education method: Explanation, Demonstration, Tactile cues, Verbal cues, and Handouts Education comprehension: verbalized understanding, returned demonstration, and needs further  education  HOME EXERCISE PROGRAM: Access Code: [email or web address] URL: [email or web address] Date: 02/05/2023 Prepared by: Norton Blizzard  Exercises - Row with band/cable  - 1 x daily - 3 sets - 10-15 reps - 2 seconds hold - Shoulder External Rotation with Anchored Resistance  - 1 x daily - 3 sets - 15 reps - red band - Shoulder Internal Rotation with Resistance  - 1 x daily - 3 sets - 15 reps - black band - Standing Shoulder Abduction Slides at Wall  - 1 x daily - 1-2 sets - 10 reps - 4 seconds hold - Scaption Wall Slide with Towel  - 1 x daily - 1-2 sets - 10 reps - 4 seconds hold - Standing Low Trap Setting with Resistance at Wall  - 1 x daily - 2 sets - 10 reps  ASSESSMENT:  CLINICAL IMPRESSION: Patient arrives reporting difficulty with shoulder abduction AAROM stretch but otherwise good tolerance to HEP. Today's session focused on updating HEP with appropriate strengthening and ROM exercises with cuing to improve form and intensity to be most effective. Patient demonstrates tightness in right GH joint and reports discomfort during exercises, especially ER and abduction, that decreases with rest. Plan to consider joint mobilizations next session. Patient would benefit from continued management of limiting condition by skilled physical therapist to address remaining impairments and functional limitations to work towards stated goals and return to PLOF or maximal functional independence.   From Initial PT Evaluation on 02/01/2023:  Patient is a 57 y.o. female referred to outpatient physical therapy with a medical diagnosis of acute  pain of right shoulder who presents with signs and symptoms consistent with right rotator cuff related pain. Unable to reproduce concordant symptoms with testing of the cervical spine. Pattern of signs and symptoms including slight compensatory shoulder hike with elevation most closely resembles rotator cuff related pain. Patient presents with significant pain,  ROM, joint stiffness, motor control, neural tension, muscle performance (strength/power/endurance), and activity tolerance impairments that are limiting ability to complete difficulty working, using the computer mouse, reaching, dressing, pickleball, grooming, pushing, making the bed, housework, taking impact through the right UE, reaching (out, behind body, to back seat), sleeping without difficulty. Patient will benefit from skilled physical therapy intervention to address current body structure impairments and activity limitations to improve function and work towards goals set in current POC in order to return to prior level of function or maximal functional improvement.   OBJECTIVE IMPAIRMENTS: decreased activity tolerance, decreased coordination, decreased endurance, decreased knowledge of condition, difficulty walking, decreased ROM, decreased strength, hypomobility, impaired perceived functional ability, increased muscle spasms, impaired flexibility, impaired UE functional use, improper body mechanics, and pain.   ACTIVITY LIMITATIONS: carrying, lifting, sleeping, bed mobility, bathing, dressing, reach over head, and caring for others  PARTICIPATION LIMITATIONS: meal prep, cleaning, laundry, community activity, occupation, yard work, and   difficulty working, using the Firefighter, reaching, dressing, pickleball, grooming, pushing, making the bed, housework, taking impact through the right UE, reaching (out, behind body, to back seat), sleeping  PERSONAL FACTORS: Age, Past/current experiences, Time since onset of injury/illness/exacerbation, and 3+ comorbidities:   HYPERCHOLESTEROLEMIA; ALLERGIC RHINITIS; PSORIASIS; URINARY INCONTINENCE, MIXED; Palpitations; Rectal pain; Verruca vulgaris; Atopic dermatitis; GAD (generalized anxiety disorder); Panic attack; Vasovagal syncope; Pulmonary nodule; Tachycardia; GERD (gastroesophageal reflux disease); Tingling in extremities; Acute diarrhea; Acute pain of  right shoulder; and Acute pain of left knee on their problem list. She  has a past medical history of Allergy, Anxiety, GERD (gastroesophageal reflux disease), Hyperlipidemia, and Migraines are also affecting patient's functional outcome.   REHAB POTENTIAL: Good  CLINICAL DECISION MAKING: Evolving/moderate complexity  EVALUATION COMPLEXITY: Moderate   GOALS: Goals reviewed with patient? No  SHORT TERM GOALS: Target date: 02/15/2023  Patient will be independent with initial home exercise program for self-management of symptoms. Baseline: Initial HEP provided at IE (02/01/23); Goal status: In-progress   LONG TERM GOALS: Target date: 04/26/2023  Patient will be independent with a long-term home exercise program for self-management of symptoms.  Baseline: Initial HEP provided at IE (02/01/23); Goal status: In-progress  2.  Patient will demonstrate improved FOTO to equal or greater than 68 by visit #10 to demonstrate improvement in overall condition and self-reported functional ability.  Baseline: 61 (02/01/23); Goal status: In-progress  3.  Patient will demonstrate R shoulder AROM equal or greater than L shoulder AROM to improve her ability to complete daily activities such as working, dressing, and playing pickleball.  Baseline: significant limitations - see objective (02/01/23); Goal status: In-progress  4.  Patient will demonstrate R shoulder MMT equal or greater than L shoulder MMT with no increase in concordant pain to improve her ability to complete valued activities such as dressing, reaching, pushing, working, and pickleball.  Baseline: significant limitations (02/01/23); Goal status: In-progress  5.  Patient will complete community, work and/or recreational activities with 75% less limitation due to current condition.  Baseline: difficulty working, using the computer mouse, reaching, dressing, pickleball, grooming, pushing, making the bed, housework, taking impact through the  right UE, reaching (out, behind body, to back seat), sleeping (02/01/23); Goal status:  In-progress  PLAN:  PT FREQUENCY: 1-2x/week  PT DURATION: 12 weeks  PLANNED INTERVENTIONS: Therapeutic exercises, Therapeutic activity, Neuromuscular re-education, Patient/Family education, Self Care, Joint mobilization, Dry Needling, Electrical stimulation, Spinal mobilization, Cryotherapy, Moist heat, Manual therapy, and Re-evaluation  PLAN FOR NEXT SESSION: update HEP as appropriate, progressive shoulder girdle/UE/postural/functional strengthening and ROM exercises as tolerated, manual therapy and dry needling as needed. Education. Consider nerve glides.    Cira Rue, PT, DPT 02/05/2023, 10:07 AM   Addendum to correct error in objective section.  Luretha Murphy. Ilsa Iha, PT, DPT 02/07/23, 3:22 PM  [facility] [address] [place], [place] [address] P: [phone] I F: [phone]

## 2023-02-07 ENCOUNTER — Ambulatory Visit: Payer: 59 | Admitting: Physical Therapy

## 2023-02-07 ENCOUNTER — Encounter: Payer: Self-pay | Admitting: Physical Therapy

## 2023-02-07 DIAGNOSIS — M79621 Pain in right upper arm: Secondary | ICD-10-CM | POA: Diagnosis not present

## 2023-02-07 DIAGNOSIS — M25611 Stiffness of right shoulder, not elsewhere classified: Secondary | ICD-10-CM

## 2023-02-07 DIAGNOSIS — G8929 Other chronic pain: Secondary | ICD-10-CM

## 2023-02-07 DIAGNOSIS — M6281 Muscle weakness (generalized): Secondary | ICD-10-CM

## 2023-02-07 DIAGNOSIS — M25511 Pain in right shoulder: Secondary | ICD-10-CM | POA: Diagnosis not present

## 2023-02-07 DIAGNOSIS — J301 Allergic rhinitis due to pollen: Secondary | ICD-10-CM | POA: Diagnosis not present

## 2023-02-07 NOTE — Therapy (Signed)
OUTPATIENT PHYSICAL THERAPY TREATMENT   Patient Name: Joyce Brock MRN: [id number] DOB:12-23-1965, 57 y.o., female Today's Date: 02/07/2023  END OF SESSION:  PT End of Session - 02/07/23 1523     Visit Number 3    Number of Visits 13    Date for PT Re-Evaluation 04/26/23    Authorization Type [place] [employer] SAVE reporting period from 02/01/2023    Authorization Time Period *only 4 modalities per visit*  VL 25 PT/OT combined  medial review after 25th visit    Authorization - Visit Number 3    Authorization - Number of Visits 25    Progress Note Due on Visit 10    PT Start Time 1517    PT Stop Time 1557    PT Time Calculation (min) 40 min    Activity Tolerance Patient tolerated treatment well    Behavior During Therapy [facility] for tasks assessed/performed             Past Medical History:  Diagnosis Date   Allergy    Anxiety    GERD (gastroesophageal reflux disease)    Hyperlipidemia    Migraines    Past Surgical History:  Procedure Laterality Date   CHOLECYSTECTOMY     Patient Active Problem List   Diagnosis Date Noted   Acute diarrhea 11/22/2022   Acute pain of right shoulder 11/22/2022   Acute pain of left knee 11/22/2022   Tingling in extremities 05/11/2022   GERD (gastroesophageal reflux disease) 01/16/2020   Tachycardia    Vasovagal syncope 01/11/2020   Pulmonary nodule    GAD (generalized anxiety disorder) 10/04/2018   Panic attack 10/04/2018   Atopic dermatitis 01/18/2017   Verruca vulgaris 11/15/2012   Rectal pain 03/29/2012   Palpitations 12/26/2011   HYPERCHOLESTEROLEMIA 02/05/2009   ALLERGIC RHINITIS 10/30/2008   PSORIASIS 10/30/2008   URINARY INCONTINENCE, MIXED 10/30/2008    PCP: Excell Seltzer, MD  REFERRING PROVIDER: Excell Seltzer, MD  REFERRING DIAG: acute pain of right shoulder  THERAPY DIAG:  Chronic right shoulder pain  Pain in right upper arm  Stiffness of right shoulder, not elsewhere classified  Muscle weakness  (generalized)  Rationale for Evaluation and Treatment: Rehabilitation  ONSET DATE: 6 months or more from PT evaluation  PERTINENT HISTORY: Patient is a 57 y.o. female who presents to outpatient physical therapy with a referral for medical diagnosis acute pain of right shoulder. This patient's chief complaints consist of pain over the right posterior arm and lateral arm, difficulty using right arm, leading to the following functional deficits: difficulty working, using the computer mouse, reaching, dressing, pickleball, grooming, pushing, making the bed, housework, taking impact through the right UE, reaching (out, behind body, to back seat), sleeping. Relevant past medical history and comorbidities include has HYPERCHOLESTEROLEMIA; ALLERGIC RHINITIS; PSORIASIS; URINARY INCONTINENCE, MIXED; Palpitations; Rectal pain; Verruca vulgaris; Atopic dermatitis; GAD (generalized anxiety disorder); Panic attack; Vasovagal syncope; Pulmonary nodule; Tachycardia; GERD (gastroesophageal reflux disease); Tingling in extremities; Acute diarrhea; Acute pain of right shoulder; and Acute pain of left knee on their problem list. She  has a past medical history of Allergy, Anxiety, GERD (gastroesophageal reflux disease), Hyperlipidemia, and Migraines. She has a past surgical history that includes Cholecystectomy. Patient denies hx of cancer, stroke, seizures, lung problems, heart problems, diabetes, unexplained weight loss, unexplained changes in bowel or bladder problems, unexplained stumbling or dropping things, osteoporosis, and spinal surgery  SUBJECTIVE:  SUBJECTIVE STATEMENT: Patient states her right upper arm is a little sore. She states she woke up and her right lateral arm was hurting. She continued to have difficulty with wall slides in  abduction. She was not sore after her last PT session.   PAIN:  NPRS: 1/10 right lateral shoulder   PATIENT GOALS: "to be able to do without the hurting and not damaging to the point I would need surgery or anything like that"  NEXT MD VISIT: no follow up for this specific problem, physical in October.   OBJECTIVE  TODAY'S TREATMENT:  Therapeutic exercise: to centralize symptoms and improve ROM, strength, muscular endurance, and activity tolerance required for successful completion of functional activities.  - Upper body ergometer level 7 to encourage joint nutrition, warm tissue, induce analgesic effect of aerobic exercise, improve muscular strength and endurance,  and prepare for remainder of session.5 min total changing directions every 1 minute.  - Standing rows with scapular retraction for improved postural and shoulder girdle strengthening and mobility. Required instruction for technique and cuing to retract, posteriorly tilt, and depress scapulae. 3x10 with 20# cable.  (Manual therapy - see below).  - Hooklying chest press with plus, 1x10 AROM, 2x15 with 5#DB in each hand. Min cuing for hand position over shoulder and scapular protraction.   Superset:  - sidelying R shoulder abduction, 2x15 with 5#DB. (RPE 5) - sidelying R shoulder ER with towel under arm, 2x15 with 5#DB (RPE 10)  Manual therapy: to reduce pain and tissue tension, improve range of motion, neuromodulation, in order to promote improved ability to complete functional activities. SUPINE/HOOKLYING - R shoulder PROM 1x5 before and after other techniques to assess for changes.  - R GHJ distraction and PA glide at 30 degrees abduction, 1-2x 30 seconds each direction, grade III-IV.  - R GHJ caudal glide at 90 degrees abduction, 2x30 seconds, grade III-IV - STM to left infraspinatus, subscapularis, and latissimus dorsi with sustained pressure (reproduction of pain at subscapularis).  (Minimal change in PROM following manual  therapy).   Pt required multimodal cuing for proper technique and to facilitate improved neuromuscular control, strength, range of motion, and functional ability resulting in improved performance and form.   PATIENT EDUCATION: Education details: Exercise purpose/form. Self management techniques. Education on diagnosis, prognosis, POC, anatomy and physiology of current condition. Education on HEP including handout. Person educated: Patient Education method: Explanation, Demonstration, Tactile cues, Verbal cues, and Handouts Education comprehension: verbalized understanding, returned demonstration, and needs further education  HOME EXERCISE PROGRAM: Access Code: [email or web address] URL: [email or web address] Date: 02/05/2023 Prepared by: Norton Blizzard  Exercises - Row with band/cable  - 1 x daily - 3 sets - 10-15 reps - 2 seconds hold - Shoulder External Rotation with Anchored Resistance  - 1 x daily - 3 sets - 15 reps - red band - Shoulder Internal Rotation with Resistance  - 1 x daily - 3 sets - 15 reps - black band - Standing Shoulder Abduction Slides at Wall  - 1 x daily - 1-2 sets - 10 reps - 4 seconds hold - Scaption Wall Slide with Towel  - 1 x daily - 1-2 sets - 10 reps - 4 seconds hold - Standing Low Trap Setting with Resistance at Wall  - 1 x daily - 2 sets - 10 reps  ASSESSMENT:  CLINICAL IMPRESSION: Patient arrives reporting good tolerance to prior PT sessions and HEP except for AAROM abduction. Today's session focused on manual therapy for pain control and continued R shoulder  girdle strengthening. Concept of rate of perceived exertion and reps in reserve introduced today with chart focused on rep-based exercises. Patient was able to reach a RPE of 10 with R shoulder ER in sideling. She reported mild increase in soreness by end of session but not increase in pain. She was tender with reproduction of symptoms with STM to right subscapularis.  Plan to continue with manual  therapy and progressive strengthening as tolerated. Patient would benefit from continued management of limiting condition by skilled physical therapist to address remaining impairments and functional limitations to work towards stated goals and return to PLOF or maximal functional independence.    From Initial PT Evaluation on 02/01/2023:  Patient is a 57 y.o. female referred to outpatient physical therapy with a medical diagnosis of acute pain of right shoulder who presents with signs and symptoms consistent with right rotator cuff related pain. Unable to reproduce concordant symptoms with testing of the cervical spine. Pattern of signs and symptoms including slight compensatory shoulder hike with elevation most closely resembles rotator cuff related pain. Patient presents with significant pain, ROM, joint stiffness, motor control, neural tension, muscle performance (strength/power/endurance), and activity tolerance impairments that are limiting ability to complete difficulty working, using the computer mouse, reaching, dressing, pickleball, grooming, pushing, making the bed, housework, taking impact through the right UE, reaching (out, behind body, to back seat), sleeping without difficulty. Patient will benefit from skilled physical therapy intervention to address current body structure impairments and activity limitations to improve function and work towards goals set in current POC in order to return to prior level of function or maximal functional improvement.   OBJECTIVE IMPAIRMENTS: decreased activity tolerance, decreased coordination, decreased endurance, decreased knowledge of condition, difficulty walking, decreased ROM, decreased strength, hypomobility, impaired perceived functional ability, increased muscle spasms, impaired flexibility, impaired UE functional use, improper body mechanics, and pain.   ACTIVITY LIMITATIONS: carrying, lifting, sleeping, bed mobility, bathing, dressing, reach over  head, and caring for others  PARTICIPATION LIMITATIONS: meal prep, cleaning, laundry, community activity, occupation, yard work, and   difficulty working, using the Firefighter, reaching, dressing, pickleball, grooming, pushing, making the bed, housework, taking impact through the right UE, reaching (out, behind body, to back seat), sleeping  PERSONAL FACTORS: Age, Past/current experiences, Time since onset of injury/illness/exacerbation, and 3+ comorbidities:   HYPERCHOLESTEROLEMIA; ALLERGIC RHINITIS; PSORIASIS; URINARY INCONTINENCE, MIXED; Palpitations; Rectal pain; Verruca vulgaris; Atopic dermatitis; GAD (generalized anxiety disorder); Panic attack; Vasovagal syncope; Pulmonary nodule; Tachycardia; GERD (gastroesophageal reflux disease); Tingling in extremities; Acute diarrhea; Acute pain of right shoulder; and Acute pain of left knee on their problem list. She  has a past medical history of Allergy, Anxiety, GERD (gastroesophageal reflux disease), Hyperlipidemia, and Migraines are also affecting patient's functional outcome.   REHAB POTENTIAL: Good  CLINICAL DECISION MAKING: Evolving/moderate complexity  EVALUATION COMPLEXITY: Moderate   GOALS: Goals reviewed with patient? No  SHORT TERM GOALS: Target date: 02/15/2023  Patient will be independent with initial home exercise program for self-management of symptoms. Baseline: Initial HEP provided at IE (02/01/23); Goal status: In-progress   LONG TERM GOALS: Target date: 04/26/2023  Patient will be independent with a long-term home exercise program for self-management of symptoms.  Baseline: Initial HEP provided at IE (02/01/23); Goal status: In-progress  2.  Patient will demonstrate improved FOTO to equal or greater than 68 by visit #10 to demonstrate improvement in overall condition and self-reported functional ability.  Baseline: 61 (02/01/23); Goal status: In-progress  3.  Patient will demonstrate R shoulder  AROM equal or  greater than L shoulder AROM to improve her ability to complete daily activities such as working, dressing, and playing pickleball.  Baseline: significant limitations - see objective (02/01/23); Goal status: In-progress  4.  Patient will demonstrate R shoulder MMT equal or greater than L shoulder MMT with no increase in concordant pain to improve her ability to complete valued activities such as dressing, reaching, pushing, working, and pickleball.  Baseline: significant limitations (02/01/23); Goal status: In-progress  5.  Patient will complete community, work and/or recreational activities with 75% less limitation due to current condition.  Baseline: difficulty working, using the computer mouse, reaching, dressing, pickleball, grooming, pushing, making the bed, housework, taking impact through the right UE, reaching (out, behind body, to back seat), sleeping (02/01/23); Goal status: In-progress  PLAN:  PT FREQUENCY: 1-2x/week  PT DURATION: 12 weeks  PLANNED INTERVENTIONS: Therapeutic exercises, Therapeutic activity, Neuromuscular re-education, Patient/Family education, Self Care, Joint mobilization, Dry Needling, Electrical stimulation, Spinal mobilization, Cryotherapy, Moist heat, Manual therapy, and Re-evaluation  PLAN FOR NEXT SESSION: update HEP as appropriate, progressive shoulder girdle/UE/postural/functional strengthening and ROM exercises as tolerated, manual therapy and dry needling as needed. Education. Consider nerve glides.    Cira Rue, PT, DPT 02/07/2023, 8:14 PM

## 2023-02-14 ENCOUNTER — Ambulatory Visit: Payer: 59 | Attending: Family Medicine

## 2023-02-14 DIAGNOSIS — M79621 Pain in right upper arm: Secondary | ICD-10-CM | POA: Diagnosis not present

## 2023-02-14 DIAGNOSIS — G8929 Other chronic pain: Secondary | ICD-10-CM | POA: Diagnosis not present

## 2023-02-14 DIAGNOSIS — M25611 Stiffness of right shoulder, not elsewhere classified: Secondary | ICD-10-CM | POA: Insufficient documentation

## 2023-02-14 DIAGNOSIS — J301 Allergic rhinitis due to pollen: Secondary | ICD-10-CM | POA: Diagnosis not present

## 2023-02-14 DIAGNOSIS — M25511 Pain in right shoulder: Secondary | ICD-10-CM | POA: Diagnosis not present

## 2023-02-14 DIAGNOSIS — M6281 Muscle weakness (generalized): Secondary | ICD-10-CM | POA: Insufficient documentation

## 2023-02-14 NOTE — Therapy (Signed)
OUTPATIENT PHYSICAL THERAPY TREATMENT   Patient Name: Joyce Brock MRN: [id number] DOB:1966/03/31, 57 y.o., female Today's Date: 02/14/2023  END OF SESSION:  PT End of Session - 02/14/23 1809     Visit Number 4    Number of Visits 13    Date for PT Re-Evaluation 04/26/23    Authorization Type [place] [employer] SAVE reporting period from 02/01/2023    Authorization Time Period *only 4 modalities per visit*  VL 25 PT/OT combined  medial review after 25th visit    Authorization - Visit Number 4    Authorization - Number of Visits 25    Progress Note Due on Visit 10    PT Start Time 1805    PT Stop Time 1845    PT Time Calculation (min) 40 min    Activity Tolerance Patient tolerated treatment well;No increased pain    Behavior During Therapy [facility] for tasks assessed/performed             Past Medical History:  Diagnosis Date   Allergy    Anxiety    GERD (gastroesophageal reflux disease)    Hyperlipidemia    Migraines    Past Surgical History:  Procedure Laterality Date   CHOLECYSTECTOMY     Patient Active Problem List   Diagnosis Date Noted   Acute diarrhea 11/22/2022   Acute pain of right shoulder 11/22/2022   Acute pain of left knee 11/22/2022   Tingling in extremities 05/11/2022   GERD (gastroesophageal reflux disease) 01/16/2020   Tachycardia    Vasovagal syncope 01/11/2020   Pulmonary nodule    GAD (generalized anxiety disorder) 10/04/2018   Panic attack 10/04/2018   Atopic dermatitis 01/18/2017   Verruca vulgaris 11/15/2012   Rectal pain 03/29/2012   Palpitations 12/26/2011   HYPERCHOLESTEROLEMIA 02/05/2009   ALLERGIC RHINITIS 10/30/2008   PSORIASIS 10/30/2008   URINARY INCONTINENCE, MIXED 10/30/2008    PCP: Excell Seltzer, MD  REFERRING PROVIDER: Excell Seltzer, MD  REFERRING DIAG: acute pain of right shoulder  THERAPY DIAG:  Chronic right shoulder pain  Pain in right upper arm  Stiffness of right shoulder, not elsewhere  classified  Muscle weakness (generalized)  Rationale for Evaluation and Treatment: Rehabilitation  ONSET DATE: 6 months or more from PT evaluation  PERTINENT HISTORY: Patient is a 57 y.o. female who presents to outpatient physical therapy with a referral for medical diagnosis acute pain of right shoulder. This patient's chief complaints consist of pain over the right posterior arm and lateral arm, difficulty using right arm, leading to the following functional deficits: difficulty working, using the computer mouse, reaching, dressing, pickleball, grooming, pushing, making the bed, housework, taking impact through the right UE, reaching (out, behind body, to back seat), sleeping. Relevant past medical history and comorbidities include has HYPERCHOLESTEROLEMIA; ALLERGIC RHINITIS; PSORIASIS; URINARY INCONTINENCE, MIXED; Palpitations; Rectal pain; Verruca vulgaris; Atopic dermatitis; GAD (generalized anxiety disorder); Panic attack; Vasovagal syncope; Pulmonary nodule; Tachycardia; GERD (gastroesophageal reflux disease); Tingling in extremities; Acute diarrhea; Acute pain of right shoulder; and Acute pain of left knee on their problem list. She  has a past medical history of Allergy, Anxiety, GERD (gastroesophageal reflux disease), Hyperlipidemia, and Migraines. She has a past surgical history that includes Cholecystectomy. Patient denies hx of cancer, stroke, seizures, lung problems, heart problems, diabetes, unexplained weight loss, unexplained changes in bowel or bladder problems, unexplained stumbling or dropping things, osteoporosis, and spinal surgery  SUBJECTIVE:  SUBJECTIVE STATEMENT: Pt reports a lot of soreness after last session, thinks it due to manual therapies last session. Pt HEP is coming along fair as a result.    PAIN:  No pain on arrival, just sore.   PATIENT GOALS: "to be able to do without the hurting and not damaging to the point I would need surgery or anything like that"  NEXT MD VISIT: no follow up for this specific problem, physical in October.   OBJECTIVE  TODAY'S TREATMENT:  - Upper body ergometer level 7, 5 min total changing directions every 1 minute.   - Standing rows with scapular 1x10 with 20# cable.  - seated chest press 1x15 @ 15lb  - Standing rows with scapular 1x10 with 20# cable.  - seated chest press 1x15 @ 15lb  - Standing rows with scapular 1x10 with 20# cable.  - seated chest press 1x15 @ 15lb   - sidelying R shoulder abduction, 1x15 with 5#DB - sidelying R shoulder ER with towel under arm, 1x15 with 4 #DB  - sidelying R shoulder horizontal ABDCT c 1kg [place] medicine ball x15 - Hooklying Rt shoulder IR from 55 degrees ER 1x15 c 2lb (pain beyond this, effort easier than others)   - sidelying R shoulder abduction, 1x15 with 5#DB - sidelying R shoulder ER with towel under arm, 1x15 with 4 #DB  - sidelying R shoulder horizontal ABDCT c 1kg [place] medicine ball x15 - Hooklying Rt shoulder IR from 55 degrees ER 1x15 c 3lb  Pt required multimodal cuing for proper technique and to facilitate improved neuromuscular control, strength, range of motion, and functional ability resulting in improved performance and form.   PATIENT EDUCATION: Education details: tyypical prognosis and timeline for recovery to DC.  Education on HEP including handout. Person educated: Patient Education method: Explanation, Demonstration, Tactile cues, Verbal cues, and Handouts Education comprehension: verbalized understanding, returned demonstration, and needs further education  HOME EXERCISE PROGRAM: Access Code: [email or web address] URL: [email or web address] Date: 02/05/2023 Prepared by: Norton Blizzard  Exercises - Row with band/cable  - 1 x daily - 3 sets - 10-15 reps - 2 seconds  hold - Shoulder External Rotation with Anchored Resistance  - 1 x daily - 3 sets - 15 reps - red band - Shoulder Internal Rotation with Resistance  - 1 x daily - 3 sets - 15 reps - black band - Standing Shoulder Abduction Slides at Wall  - 1 x daily - 1-2 sets - 10 reps - 4 seconds hold - Scaption Wall Slide with Towel  - 1 x daily - 1-2 sets - 10 reps - 4 seconds hold - Standing Low Trap Setting with Resistance at Wall  - 1 x daily - 2 sets - 10 reps  ASSESSMENT:  CLINICAL IMPRESSION: Continued with current load regimen, able to progress loading volume somewhat compared to prior sessions. No pain during session, aside from end range ER. Added in resisted IR this date. Plan to continue with manual therapy and progressive strengthening as tolerated. Patient would benefit from continued management of limiting condition by skilled physical therapist to address remaining impairments and functional limitations to work towards stated goals and return to PLOF or maximal functional independence.    OBJECTIVE IMPAIRMENTS: decreased activity tolerance, decreased coordination, decreased endurance, decreased knowledge of condition, difficulty walking, decreased ROM, decreased strength, hypomobility, impaired perceived functional ability, increased muscle spasms, impaired flexibility, impaired UE functional use, improper body mechanics, and pain.   ACTIVITY LIMITATIONS: carrying, lifting, sleeping, bed mobility, bathing, dressing,  reach over head, and caring for others  PARTICIPATION LIMITATIONS: meal prep, cleaning, laundry, community activity, occupation, yard work, and   difficulty working, using the Firefighter, reaching, dressing, pickleball, grooming, pushing, making the bed, housework, taking impact through the right UE, reaching (out, behind body, to back seat), sleeping  PERSONAL FACTORS: Age, Past/current experiences, Time since onset of injury/illness/exacerbation, and 3+ comorbidities:    HYPERCHOLESTEROLEMIA; ALLERGIC RHINITIS; PSORIASIS; URINARY INCONTINENCE, MIXED; Palpitations; Rectal pain; Verruca vulgaris; Atopic dermatitis; GAD (generalized anxiety disorder); Panic attack; Vasovagal syncope; Pulmonary nodule; Tachycardia; GERD (gastroesophageal reflux disease); Tingling in extremities; Acute diarrhea; Acute pain of right shoulder; and Acute pain of left knee on their problem list. She  has a past medical history of Allergy, Anxiety, GERD (gastroesophageal reflux disease), Hyperlipidemia, and Migraines are also affecting patient's functional outcome.   REHAB POTENTIAL: Good  CLINICAL DECISION MAKING: Evolving/moderate complexity  EVALUATION COMPLEXITY: Moderate   GOALS: Goals reviewed with patient? No  SHORT TERM GOALS: Target date: 02/15/2023  Patient will be independent with initial home exercise program for self-management of symptoms. Baseline: Initial HEP provided at IE (02/01/23); Goal status: In-progress  LONG TERM GOALS: Target date: 04/26/2023  Patient will be independent with a long-term home exercise program for self-management of symptoms.  Baseline: Initial HEP provided at IE (02/01/23); Goal status: In-progress  2.  Patient will demonstrate improved FOTO to equal or greater than 68 by visit #10 to demonstrate improvement in overall condition and self-reported functional ability.  Baseline: 61 (02/01/23); Goal status: In-progress  3.  Patient will demonstrate R shoulder AROM equal or greater than L shoulder AROM to improve her ability to complete daily activities such as working, dressing, and playing pickleball.  Baseline: significant limitations - see objective (02/01/23); Goal status: In-progress  4.  Patient will demonstrate R shoulder MMT equal or greater than L shoulder MMT with no increase in concordant pain to improve her ability to complete valued activities such as dressing, reaching, pushing, working, and pickleball.  Baseline: significant  limitations (02/01/23); Goal status: In-progress  5.  Patient will complete community, work and/or recreational activities with 75% less limitation due to current condition.  Baseline: difficulty working, using the computer mouse, reaching, dressing, pickleball, grooming, pushing, making the bed, housework, taking impact through the right UE, reaching (out, behind body, to back seat), sleeping (02/01/23); Goal status: In-progress  PLAN:  PT FREQUENCY: 1-2x/week  PT DURATION: 12 weeks  PLANNED INTERVENTIONS: Therapeutic exercises, Therapeutic activity, Neuromuscular re-education, Patient/Family education, Self Care, Joint mobilization, Dry Needling, Electrical stimulation, Spinal mobilization, Cryotherapy, Moist heat, Manual therapy, and Re-evaluation  PLAN FOR NEXT SESSION: update HEP as appropriate, progressive shoulder girdle/UE/postural/functional strengthening and ROM exercises as tolerated, manual therapy and dry needling as needed. Education. Consider nerve glides.    Rosamaria Lints, PT, DPT 02/14/2023, 6:10 PM    6:36 PM, 02/14/23 Rosamaria Lints, PT, DPT Physical Therapist - [facility]  [phone] [facility])

## 2023-02-20 ENCOUNTER — Encounter: Payer: Self-pay | Admitting: Cardiology

## 2023-02-21 ENCOUNTER — Ambulatory Visit: Payer: 59 | Admitting: Physical Therapy

## 2023-02-21 ENCOUNTER — Encounter: Payer: Self-pay | Admitting: Physical Therapy

## 2023-02-21 DIAGNOSIS — J301 Allergic rhinitis due to pollen: Secondary | ICD-10-CM | POA: Diagnosis not present

## 2023-02-21 DIAGNOSIS — G8929 Other chronic pain: Secondary | ICD-10-CM

## 2023-02-21 DIAGNOSIS — M6281 Muscle weakness (generalized): Secondary | ICD-10-CM | POA: Diagnosis not present

## 2023-02-21 DIAGNOSIS — M25611 Stiffness of right shoulder, not elsewhere classified: Secondary | ICD-10-CM

## 2023-02-21 DIAGNOSIS — M25511 Pain in right shoulder: Secondary | ICD-10-CM | POA: Diagnosis not present

## 2023-02-21 DIAGNOSIS — M79621 Pain in right upper arm: Secondary | ICD-10-CM

## 2023-02-21 NOTE — Therapy (Signed)
OUTPATIENT PHYSICAL THERAPY TREATMENT   Patient Name: Joyce Brock MRN: [id number] DOB:Aug 15, 1965, 57 y.o., female Today's Date: 02/21/2023  END OF SESSION:  PT End of Session - 02/21/23 1442     Visit Number 5    Number of Visits 13    Date for PT Re-Evaluation 04/26/23    Authorization Type  [employer] SAVE reporting period from 02/01/2023    Authorization Time Period *only 4 modalities per visit*  VL 25 PT/OT combined  medial review after 25th visit    Authorization - Visit Number 5    Authorization - Number of Visits 25    Progress Note Due on Visit 10    PT Start Time 1437    PT Stop Time 1515    PT Time Calculation (min) 38 min    Activity Tolerance Patient tolerated treatment well;No increased pain    Behavior During Therapy [facility] for tasks assessed/performed              Past Medical History:  Diagnosis Date   Allergy    Anxiety    GERD (gastroesophageal reflux disease)    Hyperlipidemia    Migraines    Past Surgical History:  Procedure Laterality Date   CHOLECYSTECTOMY     Patient Active Problem List   Diagnosis Date Noted   Acute diarrhea 11/22/2022   Acute pain of right shoulder 11/22/2022   Acute pain of left knee 11/22/2022   Tingling in extremities 05/11/2022   GERD (gastroesophageal reflux disease) 01/16/2020   Tachycardia    Vasovagal syncope 01/11/2020   Pulmonary nodule    GAD (generalized anxiety disorder) 10/04/2018   Panic attack 10/04/2018   Atopic dermatitis 01/18/2017   Verruca vulgaris 11/15/2012   Rectal pain 03/29/2012   Palpitations 12/26/2011   HYPERCHOLESTEROLEMIA 02/05/2009   ALLERGIC RHINITIS 10/30/2008   PSORIASIS 10/30/2008   URINARY INCONTINENCE, MIXED 10/30/2008    PCP: Excell Seltzer, MD  REFERRING PROVIDER: Excell Seltzer, MD  REFERRING DIAG: acute pain of right shoulder  THERAPY DIAG:  Chronic right shoulder pain  Pain in right upper arm  Stiffness of right shoulder, not elsewhere  classified  Muscle weakness (generalized)  Rationale for Evaluation and Treatment: Rehabilitation  ONSET DATE: 6 months or more from PT evaluation  PERTINENT HISTORY: Patient is a 57 y.o. female who presents to outpatient physical therapy with a referral for medical diagnosis acute pain of right shoulder. This patient's chief complaints consist of pain over the right posterior arm and lateral arm, difficulty using right arm, leading to the following functional deficits: difficulty working, using the computer mouse, reaching, dressing, pickleball, grooming, pushing, making the bed, housework, taking impact through the right UE, reaching (out, behind body, to back seat), sleeping. Relevant past medical history and comorbidities include has HYPERCHOLESTEROLEMIA; ALLERGIC RHINITIS; PSORIASIS; URINARY INCONTINENCE, MIXED; Palpitations; Rectal pain; Verruca vulgaris; Atopic dermatitis; GAD (generalized anxiety disorder); Panic attack; Vasovagal syncope; Pulmonary nodule; Tachycardia; GERD (gastroesophageal reflux disease); Tingling in extremities; Acute diarrhea; Acute pain of right shoulder; and Acute pain of left knee on their problem list. She  has a past medical history of Allergy, Anxiety, GERD (gastroesophageal reflux disease), Hyperlipidemia, and Migraines. She has a past surgical history that includes Cholecystectomy. Patient denies hx of cancer, stroke, seizures, lung problems, heart problems, diabetes, unexplained weight loss, unexplained changes in bowel or bladder problems, unexplained stumbling or dropping things, osteoporosis, and spinal surgery  SUBJECTIVE:  SUBJECTIVE STATEMENT: Patient reports her shoulder is feeling "not bad." She thinks it is "getting there" although she can feels like her shoulder catches when  she tries to stretch it certain ways. Such as when she tried to hold the door open for someone yesterday.   PAIN:  NPRS: 0/10  PATIENT GOALS: "to be able to do without the hurting and not damaging to the point I would need surgery or anything like that"  NEXT MD VISIT: no follow up for this specific problem, physical in October.   OBJECTIVE  SELF-REPORTED FUNCTION FOTO score: 62/100 (shoulder questionnaire)   TODAY'S TREATMENT:  Therapeutic exercise: to centralize symptoms and improve ROM, strength, muscular endurance, and activity tolerance required for successful completion of functional activities.  - Upper body ergometer level 7, 5 min total changing directions every 1 minute.  - Standing rows with scapular 1x11 with 25# cable.  - seated chest press 1x15 @ 20lb  - Standing rows with scapular 1x15 with 25# cable.  - seated chest press 1x15 @ 20lb  - Standing rows with scapular 1x15 with 25# cable.  - seated chest press 1x15 @ 15lb   - sidelying R shoulder abduction, 1x15 with 6#DB - sidelying R shoulder ER with towel under arm, 1x15 with 4 #DB (5# painful) - sidelying R shoulder horizontal ABDCT c 3#DB 1x15 - Hooklying Rt shoulder IR at 90 degrees abduction (from 55 degrees ER) 1x15 c 3lb (pain beyond this, effort easier than others)   - sidelying R shoulder abduction, 1x15 with 6#DB - sidelying R shoulder ER with towel under arm, 1x15 with 4 #DB  - sidelying R shoulder horizontal ABDCT c 3#DB 1x15 - Hooklying Rt shoulder IR at 90 degrees abduction (from 55 degrees ER) 1x15 c 3lb  - lower trap setting at wall with GTB looped around hands, 1x10 (updated HEP to have GTB for this exercise).  - standing serratus activation with shoulder flexion to lower trap setting with GTB loop around hands, 2x10 (replaced low trap setting at wall).   Pt required multimodal cuing for proper technique and to facilitate improved neuromuscular control, strength, range of motion, and functional  ability resulting in improved performance and form.   PATIENT EDUCATION: Education details: tyypical prognosis and timeline for recovery to DC.  Education on HEP including handout. Person educated: Patient Education method: Explanation, Demonstration, Tactile cues, Verbal cues, and Handouts Education comprehension: verbalized understanding, returned demonstration, and needs further education  HOME EXERCISE PROGRAM: Access Code: [email or web address] URL: [email or web address] Date: 02/05/2023 Prepared by: Norton Blizzard  Exercises - Row with band/cable  - 1 x daily - 3 sets - 10-15 reps - 2 seconds hold - Shoulder External Rotation with Anchored Resistance  - 1 x daily - 3 sets - 15 reps - red band - Shoulder Internal Rotation with Resistance  - 1 x daily - 3 sets - 15 reps - black band - Standing Shoulder Abduction Slides at Wall  - 1 x daily - 1-2 sets - 10 reps - 4 seconds hold - Scaption Wall Slide with Towel  - 1 x daily - 1-2 sets - 10 reps - 4 seconds hold - Standing Low Trap Setting with Resistance at Wall  - 1 x daily - 2 sets - 10 reps  ASSESSMENT:  CLINICAL IMPRESSION: Patient arrives with good tolerance to last PT visit and HEP. Continued with loading with progressed volume as tolerated. Patient had no increase in pain overall but continues to have limitations. Patient would benefit from  continued management of limiting condition by skilled physical therapist to address remaining impairments and functional limitations to work towards stated goals and return to PLOF or maximal functional independence.   OBJECTIVE IMPAIRMENTS: decreased activity tolerance, decreased coordination, decreased endurance, decreased knowledge of condition, difficulty walking, decreased ROM, decreased strength, hypomobility, impaired perceived functional ability, increased muscle spasms, impaired flexibility, impaired UE functional use, improper body mechanics, and pain.   ACTIVITY LIMITATIONS:  carrying, lifting, sleeping, bed mobility, bathing, dressing, reach over head, and caring for others  PARTICIPATION LIMITATIONS: meal prep, cleaning, laundry, community activity, occupation, yard work, and   difficulty working, using the Firefighter, reaching, dressing, pickleball, grooming, pushing, making the bed, housework, taking impact through the right UE, reaching (out, behind body, to back seat), sleeping  PERSONAL FACTORS: Age, Past/current experiences, Time since onset of injury/illness/exacerbation, and 3+ comorbidities:   HYPERCHOLESTEROLEMIA; ALLERGIC RHINITIS; PSORIASIS; URINARY INCONTINENCE, MIXED; Palpitations; Rectal pain; Verruca vulgaris; Atopic dermatitis; GAD (generalized anxiety disorder); Panic attack; Vasovagal syncope; Pulmonary nodule; Tachycardia; GERD (gastroesophageal reflux disease); Tingling in extremities; Acute diarrhea; Acute pain of right shoulder; and Acute pain of left knee on their problem list. She  has a past medical history of Allergy, Anxiety, GERD (gastroesophageal reflux disease), Hyperlipidemia, and Migraines are also affecting patient's functional outcome.   REHAB POTENTIAL: Good  CLINICAL DECISION MAKING: Evolving/moderate complexity  EVALUATION COMPLEXITY: Moderate   GOALS: Goals reviewed with patient? No  SHORT TERM GOALS: Target date: 02/15/2023  Patient will be independent with initial home exercise program for self-management of symptoms. Baseline: Initial HEP provided at IE (02/01/23); Goal status: In-progress  LONG TERM GOALS: Target date: 04/26/2023  Patient will be independent with a long-term home exercise program for self-management of symptoms.  Baseline: Initial HEP provided at IE (02/01/23); Goal status: In-progress  2.  Patient will demonstrate improved FOTO to equal or greater than 68 by visit #10 to demonstrate improvement in overall condition and self-reported functional ability.  Baseline: 61 (02/01/23); 62 at visit #5  (02/21/2023);  Goal status: In-progress  3.  Patient will demonstrate R shoulder AROM equal or greater than L shoulder AROM to improve her ability to complete daily activities such as working, dressing, and playing pickleball.  Baseline: significant limitations - see objective (02/01/23); Goal status: In-progress  4.  Patient will demonstrate R shoulder MMT equal or greater than L shoulder MMT with no increase in concordant pain to improve her ability to complete valued activities such as dressing, reaching, pushing, working, and pickleball.  Baseline: significant limitations (02/01/23); Goal status: In-progress  5.  Patient will complete community, work and/or recreational activities with 75% less limitation due to current condition.  Baseline: difficulty working, using the computer mouse, reaching, dressing, pickleball, grooming, pushing, making the bed, housework, taking impact through the right UE, reaching (out, behind body, to back seat), sleeping (02/01/23); Goal status: In-progress  PLAN:  PT FREQUENCY: 1-2x/week  PT DURATION: 12 weeks  PLANNED INTERVENTIONS: Therapeutic exercises, Therapeutic activity, Neuromuscular re-education, Patient/Family education, Self Care, Joint mobilization, Dry Needling, Electrical stimulation, Spinal mobilization, Cryotherapy, Moist heat, Manual therapy, and Re-evaluation  PLAN FOR NEXT SESSION: update HEP as appropriate, progressive shoulder girdle/UE/postural/functional strengthening and ROM exercises as tolerated, manual therapy and dry needling as needed. Education. Consider nerve glides.    Cira Rue, PT, DPT 02/21/2023, 3:18 PM   [facility] [address] [place], [place] [address] P: [phone] I F: [phone]

## 2023-02-25 ENCOUNTER — Other Ambulatory Visit: Payer: Self-pay | Admitting: Family Medicine

## 2023-02-25 MED FILL — Atorvastatin Calcium Tab 10 MG (Base Equivalent): ORAL | 90 days supply | Qty: 90 | Fill #1 | Status: AC

## 2023-02-26 ENCOUNTER — Other Ambulatory Visit: Payer: Self-pay

## 2023-02-26 MED ORDER — PAROXETINE HCL 10 MG PO TABS
10.0000 mg | ORAL_TABLET | Freq: Every day | ORAL | 0 refills | Status: DC
  Filled 2023-02-26: qty 90, 90d supply, fill #0

## 2023-02-27 ENCOUNTER — Encounter: Payer: Self-pay | Admitting: Physical Therapy

## 2023-02-27 ENCOUNTER — Telehealth: Payer: Self-pay | Admitting: *Deleted

## 2023-02-27 ENCOUNTER — Ambulatory Visit: Payer: 59 | Admitting: Physical Therapy

## 2023-02-27 DIAGNOSIS — G8929 Other chronic pain: Secondary | ICD-10-CM | POA: Diagnosis not present

## 2023-02-27 DIAGNOSIS — M79621 Pain in right upper arm: Secondary | ICD-10-CM

## 2023-02-27 DIAGNOSIS — M6281 Muscle weakness (generalized): Secondary | ICD-10-CM | POA: Diagnosis not present

## 2023-02-27 DIAGNOSIS — M25611 Stiffness of right shoulder, not elsewhere classified: Secondary | ICD-10-CM

## 2023-02-27 DIAGNOSIS — E7800 Pure hypercholesterolemia, unspecified: Secondary | ICD-10-CM

## 2023-02-27 DIAGNOSIS — M25511 Pain in right shoulder: Secondary | ICD-10-CM | POA: Diagnosis not present

## 2023-02-27 NOTE — Therapy (Signed)
OUTPATIENT PHYSICAL THERAPY TREATMENT   Patient Name: Joyce Brock MRN: [id number] DOB:December 14, 1965, 57 y.o., female Today's Date: 02/27/2023  END OF SESSION:  PT End of Session - 02/27/23 1358     Visit Number 6    Number of Visits 13    Date for PT Re-Evaluation 04/26/23    Authorization Type [place] [employer] SAVE reporting period from 02/01/2023    Authorization Time Period *only 4 modalities per visit*  VL 25 PT/OT combined  medial review after 25th visit    Authorization - Visit Number 6    Authorization - Number of Visits 25    Progress Note Due on Visit 10    PT Start Time 1358    PT Stop Time 1450    PT Time Calculation (min) 52 min    Activity Tolerance Patient tolerated treatment well;No increased pain    Behavior During Therapy [facility] for tasks assessed/performed               Past Medical History:  Diagnosis Date   Allergy    Anxiety    GERD (gastroesophageal reflux disease)    Hyperlipidemia    Migraines    Past Surgical History:  Procedure Laterality Date   CHOLECYSTECTOMY     Patient Active Problem List   Diagnosis Date Noted   Acute diarrhea 11/22/2022   Acute pain of right shoulder 11/22/2022   Acute pain of left knee 11/22/2022   Tingling in extremities 05/11/2022   GERD (gastroesophageal reflux disease) 01/16/2020   Tachycardia    Vasovagal syncope 01/11/2020   Pulmonary nodule    GAD (generalized anxiety disorder) 10/04/2018   Panic attack 10/04/2018   Atopic dermatitis 01/18/2017   Verruca vulgaris 11/15/2012   Rectal pain 03/29/2012   Palpitations 12/26/2011   HYPERCHOLESTEROLEMIA 02/05/2009   ALLERGIC RHINITIS 10/30/2008   PSORIASIS 10/30/2008   URINARY INCONTINENCE, MIXED 10/30/2008    PCP: Excell Seltzer, MD  REFERRING PROVIDER: Excell Seltzer, MD  REFERRING DIAG: acute pain of right shoulder  THERAPY DIAG:  Chronic right shoulder pain  Pain in right upper arm  Stiffness of right shoulder, not elsewhere  classified  Muscle weakness (generalized)  Rationale for Evaluation and Treatment: Rehabilitation  ONSET DATE: 6 months or more from PT evaluation  PERTINENT HISTORY: Patient is a 57 y.o. female who presents to outpatient physical therapy with a referral for medical diagnosis acute pain of right shoulder. This patient's chief complaints consist of pain over the right posterior arm and lateral arm, difficulty using right arm, leading to the following functional deficits: difficulty working, using the computer mouse, reaching, dressing, pickleball, grooming, pushing, making the bed, housework, taking impact through the right UE, reaching (out, behind body, to back seat), sleeping. Relevant past medical history and comorbidities include has HYPERCHOLESTEROLEMIA; ALLERGIC RHINITIS; PSORIASIS; URINARY INCONTINENCE, MIXED; Palpitations; Rectal pain; Verruca vulgaris; Atopic dermatitis; GAD (generalized anxiety disorder); Panic attack; Vasovagal syncope; Pulmonary nodule; Tachycardia; GERD (gastroesophageal reflux disease); Tingling in extremities; Acute diarrhea; Acute pain of right shoulder; and Acute pain of left knee on their problem list. She  has a past medical history of Allergy, Anxiety, GERD (gastroesophageal reflux disease), Hyperlipidemia, and Migraines. She has a past surgical history that includes Cholecystectomy. Patient denies hx of cancer, stroke, seizures, lung problems, heart problems, diabetes, unexplained weight loss, unexplained changes in bowel or bladder problems, unexplained stumbling or dropping things, osteoporosis, and spinal surgery  SUBJECTIVE:  SUBJECTIVE STATEMENT: Patient reports she was sore after last PT session but she would not consider it increased concordant pain. She states the red band was  pretty easy the last couple of times. She had to take a day off from HEP due to the soreness but went back to it okay.   PAIN:  NPRS: 0/10  PATIENT GOALS: "to be able to do without the hurting and not damaging to the point I would need surgery or anything like that"  NEXT MD VISIT: no follow up for this specific problem, physical in October.   OBJECTIVE  TODAY'S TREATMENT:  Therapeutic exercise: to centralize symptoms and improve ROM, strength, muscular endurance, and activity tolerance required for successful completion of functional activities.  - Upper body ergometer level 7, 5 min total changing directions every 1 minute.   - Standing rows with scapular 1x15 with 30# cable.  - seated chest press 1x11 @ 25lb  - Standing rows with scapular 1x15 with silver theraband - seated chest press 1x15 @ 20lb  - Standing rows with scapular 1x15 with silver theraband  - standing modified push up on TM bar 1x15   - standing shoulder abduction with anchor behind body at feet, 1x9 with RTB, 1x15 with YTB, 1x15 with YTB anchored at opposite foot.  - standing R shoulder ER with towel under arm, 3x15 with GTB - R shoulder AAROM abduction wall slide, 2 reps before and after dry needling to assess limitation and response to dry needling.  - Education on HEP including handout   Manual therapy: to reduce pain and tissue tension, improve range of motion, neuromodulation, in order to promote improved ability to complete functional activities. SUPINE - STM to right subscapular and lat dorsi region.   Modality: Dry needling performed to right subscapularis and latissimus dorsi to decrease pain and spasms along patient's right shoulder region with patient in supine utilizing 2 dry needle(s) .30mm x 60mm with 3 sticks at right subscapularis and latissimus dorsi near the axilla. Patient educated about the risks and benefits from therapy and verbally consents to treatment.  Dry needling performed by Luretha Murphy.  Ilsa Iha PT, DPT who is certified in this technique.  Pt required multimodal cuing for proper technique and to facilitate improved neuromuscular control, strength, range of motion, and functional ability resulting in improved performance and form.   PATIENT EDUCATION: Education details: tyypical prognosis and timeline for recovery to DC.  Education on HEP including handout. Person educated: Patient Education method: Explanation, Demonstration, Tactile cues, Verbal cues, and Handouts Education comprehension: verbalized understanding, returned demonstration, and needs further education  HOME EXERCISE PROGRAM: Access Code: [email or web address] URL: [email or web address] Date: 02/27/2023 Prepared by: Norton Blizzard  Exercises - Row with band/cable  - 1 x daily - 2-3 sets - 10-15 reps - 2 seconds hold - Shoulder External Rotation with Anchored Resistance  - 1 x daily - 2-3 sets - 15 reps - green progressing to blue band - Shoulder Internal Rotation with Resistance  - 1 x daily - 2-3 sets - 15 reps - black band - Standing Shoulder Abduction Slides at Wall  - 1 x daily - 1-2 sets - 10 reps - 4 seconds hold - Scaption Wall Slide with Towel  - 1 x daily - 1-2 sets - 10 reps - 4 seconds hold - Standing Low Trap Setting with Resistance at Wall  - 1 x daily - 2 sets - 10 reps - Push-Up on Counter  - 1 x daily - 2 sets -  15 reps - Standing Single Arm Shoulder Abduction with Resistance  - 1 x daily - 2 sets - 15 reps  ASSESSMENT:  CLINICAL IMPRESSION: Patient arrives with continued good tolerance to last PT session and HEP except AAROM shoulder abduction. Today's session focused on progressing exercises in clinic and in HEP as well as utilizing dry needling and manual therapy to improve ROM and ease of right shoulder abduction. Patient demonstrated significant improvement in R shoulder abduction mechanics and comfort after dry needling to the right subscapularis and lat dorsi. Consider needling to  infraspinatus next session. Patient would benefit from continued management of limiting condition by skilled physical therapist to address remaining impairments and functional limitations to work towards stated goals and return to PLOF or maximal functional independence.   OBJECTIVE IMPAIRMENTS: decreased activity tolerance, decreased coordination, decreased endurance, decreased knowledge of condition, difficulty walking, decreased ROM, decreased strength, hypomobility, impaired perceived functional ability, increased muscle spasms, impaired flexibility, impaired UE functional use, improper body mechanics, and pain.   ACTIVITY LIMITATIONS: carrying, lifting, sleeping, bed mobility, bathing, dressing, reach over head, and caring for others  PARTICIPATION LIMITATIONS: meal prep, cleaning, laundry, community activity, occupation, yard work, and   difficulty working, using the Firefighter, reaching, dressing, pickleball, grooming, pushing, making the bed, housework, taking impact through the right UE, reaching (out, behind body, to back seat), sleeping  PERSONAL FACTORS: Age, Past/current experiences, Time since onset of injury/illness/exacerbation, and 3+ comorbidities:   HYPERCHOLESTEROLEMIA; ALLERGIC RHINITIS; PSORIASIS; URINARY INCONTINENCE, MIXED; Palpitations; Rectal pain; Verruca vulgaris; Atopic dermatitis; GAD (generalized anxiety disorder); Panic attack; Vasovagal syncope; Pulmonary nodule; Tachycardia; GERD (gastroesophageal reflux disease); Tingling in extremities; Acute diarrhea; Acute pain of right shoulder; and Acute pain of left knee on their problem list. She  has a past medical history of Allergy, Anxiety, GERD (gastroesophageal reflux disease), Hyperlipidemia, and Migraines are also affecting patient's functional outcome.   REHAB POTENTIAL: Good  CLINICAL DECISION MAKING: Evolving/moderate complexity  EVALUATION COMPLEXITY: Moderate   GOALS: Goals reviewed with patient?  No  SHORT TERM GOALS: Target date: 02/15/2023  Patient will be independent with initial home exercise program for self-management of symptoms. Baseline: Initial HEP provided at IE (02/01/23); Goal status: In-progress  LONG TERM GOALS: Target date: 04/26/2023  Patient will be independent with a long-term home exercise program for self-management of symptoms.  Baseline: Initial HEP provided at IE (02/01/23); Goal status: In-progress  2.  Patient will demonstrate improved FOTO to equal or greater than 68 by visit #10 to demonstrate improvement in overall condition and self-reported functional ability.  Baseline: 61 (02/01/23); 62 at visit #5 (02/21/2023);  Goal status: In-progress  3.  Patient will demonstrate R shoulder AROM equal or greater than L shoulder AROM to improve her ability to complete daily activities such as working, dressing, and playing pickleball.  Baseline: significant limitations - see objective (02/01/23); Goal status: In-progress  4.  Patient will demonstrate R shoulder MMT equal or greater than L shoulder MMT with no increase in concordant pain to improve her ability to complete valued activities such as dressing, reaching, pushing, working, and pickleball.  Baseline: significant limitations (02/01/23); Goal status: In-progress  5.  Patient will complete community, work and/or recreational activities with 75% less limitation due to current condition.  Baseline: difficulty working, using the computer mouse, reaching, dressing, pickleball, grooming, pushing, making the bed, housework, taking impact through the right UE, reaching (out, behind body, to back seat), sleeping (02/01/23); Goal status: In-progress  PLAN:  PT FREQUENCY: 1-2x/week  PT  DURATION: 12 weeks  PLANNED INTERVENTIONS: Therapeutic exercises, Therapeutic activity, Neuromuscular re-education, Patient/Family education, Self Care, Joint mobilization, Dry Needling, Electrical stimulation, Spinal  mobilization, Cryotherapy, Moist heat, Manual therapy, and Re-evaluation  PLAN FOR NEXT SESSION: update HEP as appropriate, progressive shoulder girdle/UE/postural/functional strengthening and ROM exercises as tolerated, manual therapy and dry needling as needed. Education. Consider nerve glides.    Cira Rue, PT, DPT 02/27/2023, 3:01 PM   [facility] Rehab [address] [place], [place] [address] P: [phone] I F: [phone]

## 2023-02-27 NOTE — Telephone Encounter (Signed)
-----   Message from Lovena Neighbours sent at 02/27/2023  3:11 PM EDT ----- Regarding: Labs for Thursday 10.3.24 Please put physical lab orders in future. Thank you, Denny Peon

## 2023-02-28 DIAGNOSIS — J301 Allergic rhinitis due to pollen: Secondary | ICD-10-CM | POA: Diagnosis not present

## 2023-03-01 ENCOUNTER — Encounter: Payer: Self-pay | Admitting: Physical Therapy

## 2023-03-01 ENCOUNTER — Ambulatory Visit: Payer: 59 | Admitting: Physical Therapy

## 2023-03-01 DIAGNOSIS — M25511 Pain in right shoulder: Secondary | ICD-10-CM | POA: Diagnosis not present

## 2023-03-01 DIAGNOSIS — M25611 Stiffness of right shoulder, not elsewhere classified: Secondary | ICD-10-CM

## 2023-03-01 DIAGNOSIS — M79621 Pain in right upper arm: Secondary | ICD-10-CM | POA: Diagnosis not present

## 2023-03-01 DIAGNOSIS — G8929 Other chronic pain: Secondary | ICD-10-CM

## 2023-03-01 DIAGNOSIS — M6281 Muscle weakness (generalized): Secondary | ICD-10-CM | POA: Diagnosis not present

## 2023-03-01 NOTE — Therapy (Signed)
OUTPATIENT PHYSICAL THERAPY TREATMENT   Patient Name: Joyce Brock MRN: [id number] DOB:12/18/1965, 57 y.o., female Today's Date: 03/01/2023  END OF SESSION:  PT End of Session - 03/01/23 1440     Visit Number 7    Number of Visits 13    Date for PT Re-Evaluation 04/26/23    Authorization Type [place] [employer] SAVE reporting period from 02/01/2023    Authorization Time Period *only 4 modalities per visit*  VL 25 PT/OT combined  medial review after 25th visit    Authorization - Visit Number 7    Authorization - Number of Visits 25    Progress Note Due on Visit 10    PT Start Time 1348    PT Stop Time 1432    PT Time Calculation (min) 44 min    Activity Tolerance Patient tolerated treatment well;No increased pain    Behavior During Therapy [facility] for tasks assessed/performed                Past Medical History:  Diagnosis Date   Allergy    Anxiety    GERD (gastroesophageal reflux disease)    Hyperlipidemia    Migraines    Past Surgical History:  Procedure Laterality Date   CHOLECYSTECTOMY     Patient Active Problem List   Diagnosis Date Noted   Acute diarrhea 11/22/2022   Acute pain of right shoulder 11/22/2022   Acute pain of left knee 11/22/2022   Tingling in extremities 05/11/2022   GERD (gastroesophageal reflux disease) 01/16/2020   Tachycardia    Vasovagal syncope 01/11/2020   Pulmonary nodule    GAD (generalized anxiety disorder) 10/04/2018   Panic attack 10/04/2018   Atopic dermatitis 01/18/2017   Verruca vulgaris 11/15/2012   Rectal pain 03/29/2012   Palpitations 12/26/2011   HYPERCHOLESTEROLEMIA 02/05/2009   ALLERGIC RHINITIS 10/30/2008   PSORIASIS 10/30/2008   URINARY INCONTINENCE, MIXED 10/30/2008    PCP: Excell Seltzer, MD  REFERRING PROVIDER: Excell Seltzer, MD  REFERRING DIAG: acute pain of right shoulder  THERAPY DIAG:  Chronic right shoulder pain  Pain in right upper arm  Stiffness of right shoulder, not elsewhere  classified  Muscle weakness (generalized)  Rationale for Evaluation and Treatment: Rehabilitation  ONSET DATE: 6 months or more from PT evaluation  PERTINENT HISTORY: Patient is a 57 y.o. female who presents to outpatient physical therapy with a referral for medical diagnosis acute pain of right shoulder. This patient's chief complaints consist of pain over the right posterior arm and lateral arm, difficulty using right arm, leading to the following functional deficits: difficulty working, using the computer mouse, reaching, dressing, pickleball, grooming, pushing, making the bed, housework, taking impact through the right UE, reaching (out, behind body, to back seat), sleeping. Relevant past medical history and comorbidities include has HYPERCHOLESTEROLEMIA; ALLERGIC RHINITIS; PSORIASIS; URINARY INCONTINENCE, MIXED; Palpitations; Rectal pain; Verruca vulgaris; Atopic dermatitis; GAD (generalized anxiety disorder); Panic attack; Vasovagal syncope; Pulmonary nodule; Tachycardia; GERD (gastroesophageal reflux disease); Tingling in extremities; Acute diarrhea; Acute pain of right shoulder; and Acute pain of left knee on their problem list. She  has a past medical history of Allergy, Anxiety, GERD (gastroesophageal reflux disease), Hyperlipidemia, and Migraines. She has a past surgical history that includes Cholecystectomy. Patient denies hx of cancer, stroke, seizures, lung problems, heart problems, diabetes, unexplained weight loss, unexplained changes in bowel or bladder problems, unexplained stumbling or dropping things, osteoporosis, and spinal surgery  SUBJECTIVE:  SUBJECTIVE STATEMENT: Patient reports her shoulder has felt a lot better after dry needling last PT session. She did some stretches yesterday, but did not get to  her bands.   PAIN:  NPRS: 0/10  PATIENT GOALS: "to be able to do without the hurting and not damaging to the point I would need surgery or anything like that"  NEXT MD VISIT: no follow up for this specific problem, physical in October.   OBJECTIVE  TODAY'S TREATMENT:  Therapeutic exercise: to centralize symptoms and improve ROM, strength, muscular endurance, and activity tolerance required for successful completion of functional activities.  - Upper body ergometer level 7, 5 min total changing directions every 1 minute.  - R shoulder flexion AAROM wall slide, 1x10.  - R shoulder abduction AAROM wall slide, 1x10 (limited by pain over deltoid) (Dry needling / manual therapy - see below).  - R shoulder flexion AAROM wall slide, 1x10.  - R shoulder abduction AAROM wall slide, 1x10 (improved ROM with less pain - still limited by tightness and discomfort over deltoid).   Superset: - Standing rows with scapular 3x15 with 25# cable.  - standing modified push up with plus on sink 3x15 (high plinth too hard).   - standing shoulder abduction with anchor behind body at feet, 3x15 with YTB.  - standing R shoulder ER with towel under arm, 3x15 with GTB/BlueTB/GTB (blue quite challenging).  Manual therapy: to reduce pain and tissue tension, improve range of motion, neuromodulation, in order to promote improved ability to complete functional activities. PRONE - STM to right infraspinatus  Modality: Dry needling performed to right infraspinatus to decrease pain and spasms along patient's right shoulder region with patient in supine utilizing 1 dry needle(s) .25mm x 30mm with 1 stick at right infraspinatus. Patient educated about the risks and benefits from therapy and verbally consents to treatment.  Dry needling performed by Luretha Murphy. Ilsa Iha PT, DPT who is certified in this technique.  Pt required multimodal cuing for proper technique and to facilitate improved neuromuscular control, strength, range  of motion, and functional ability resulting in improved performance and form.   PATIENT EDUCATION: Education details: tyypical prognosis and timeline for recovery to DC.  Education on HEP including handout. Person educated: Patient Education method: Explanation, Demonstration, Tactile cues, Verbal cues, and Handouts Education comprehension: verbalized understanding, returned demonstration, and needs further education  HOME EXERCISE PROGRAM: Access Code: [email or web address] URL: [email or web address] Date: 02/27/2023 Prepared by: Norton Blizzard  Exercises - Row with band/cable  - 1 x daily - 2-3 sets - 10-15 reps - 2 seconds hold - Shoulder External Rotation with Anchored Resistance  - 1 x daily - 2-3 sets - 15 reps - green progressing to blue band - Shoulder Internal Rotation with Resistance  - 1 x daily - 2-3 sets - 15 reps - black band - Standing Shoulder Abduction Slides at Wall  - 1 x daily - 1-2 sets - 10 reps - 4 seconds hold - Scaption Wall Slide with Towel  - 1 x daily - 1-2 sets - 10 reps - 4 seconds hold - Standing Low Trap Setting with Resistance at Wall  - 1 x daily - 2 sets - 10 reps - Push-Up on Counter  - 1 x daily - 2 sets - 15 reps - Standing Single Arm Shoulder Abduction with Resistance  - 1 x daily - 2 sets - 15 reps  ASSESSMENT:  CLINICAL IMPRESSION: Patient arrives with excellent response to dry needling last PT session so that  patient limited by pain over deltoid region with AAROM abdution instead of pain in the axillary region. Dry needling to the infraspinatus relieved some of that pain and allowed her to improve AAROM in abduction and glenohumeral rhythm with shoulder abduction exercise. Continued strengthening exercises with progressions as appropriate. Patient to be out of town next week, then plan to re-assess need for further dry needling and readiness for exercise progressions when she returns. Patient would benefit from continued management of limiting  condition by skilled physical therapist to address remaining impairments and functional limitations to work towards stated goals and return to PLOF or maximal functional independence.   OBJECTIVE IMPAIRMENTS: decreased activity tolerance, decreased coordination, decreased endurance, decreased knowledge of condition, difficulty walking, decreased ROM, decreased strength, hypomobility, impaired perceived functional ability, increased muscle spasms, impaired flexibility, impaired UE functional use, improper body mechanics, and pain.   ACTIVITY LIMITATIONS: carrying, lifting, sleeping, bed mobility, bathing, dressing, reach over head, and caring for others  PARTICIPATION LIMITATIONS: meal prep, cleaning, laundry, community activity, occupation, yard work, and   difficulty working, using the Firefighter, reaching, dressing, pickleball, grooming, pushing, making the bed, housework, taking impact through the right UE, reaching (out, behind body, to back seat), sleeping  PERSONAL FACTORS: Age, Past/current experiences, Time since onset of injury/illness/exacerbation, and 3+ comorbidities:   HYPERCHOLESTEROLEMIA; ALLERGIC RHINITIS; PSORIASIS; URINARY INCONTINENCE, MIXED; Palpitations; Rectal pain; Verruca vulgaris; Atopic dermatitis; GAD (generalized anxiety disorder); Panic attack; Vasovagal syncope; Pulmonary nodule; Tachycardia; GERD (gastroesophageal reflux disease); Tingling in extremities; Acute diarrhea; Acute pain of right shoulder; and Acute pain of left knee on their problem list. She  has a past medical history of Allergy, Anxiety, GERD (gastroesophageal reflux disease), Hyperlipidemia, and Migraines are also affecting patient's functional outcome.   REHAB POTENTIAL: Good  CLINICAL DECISION MAKING: Evolving/moderate complexity  EVALUATION COMPLEXITY: Moderate   GOALS: Goals reviewed with patient? No  SHORT TERM GOALS: Target date: 02/15/2023  Patient will be independent with initial home  exercise program for self-management of symptoms. Baseline: Initial HEP provided at IE (02/01/23); Goal status: In-progress  LONG TERM GOALS: Target date: 04/26/2023  Patient will be independent with a long-term home exercise program for self-management of symptoms.  Baseline: Initial HEP provided at IE (02/01/23); Goal status: In-progress  2.  Patient will demonstrate improved FOTO to equal or greater than 68 by visit #10 to demonstrate improvement in overall condition and self-reported functional ability.  Baseline: 61 (02/01/23); 62 at visit #5 (02/21/2023);  Goal status: In-progress  3.  Patient will demonstrate R shoulder AROM equal or greater than L shoulder AROM to improve her ability to complete daily activities such as working, dressing, and playing pickleball.  Baseline: significant limitations - see objective (02/01/23); Goal status: In-progress  4.  Patient will demonstrate R shoulder MMT equal or greater than L shoulder MMT with no increase in concordant pain to improve her ability to complete valued activities such as dressing, reaching, pushing, working, and pickleball.  Baseline: significant limitations (02/01/23); Goal status: In-progress  5.  Patient will complete community, work and/or recreational activities with 75% less limitation due to current condition.  Baseline: difficulty working, using the computer mouse, reaching, dressing, pickleball, grooming, pushing, making the bed, housework, taking impact through the right UE, reaching (out, behind body, to back seat), sleeping (02/01/23); Goal status: In-progress  PLAN:  PT FREQUENCY: 1-2x/week  PT DURATION: 12 weeks  PLANNED INTERVENTIONS: Therapeutic exercises, Therapeutic activity, Neuromuscular re-education, Patient/Family education, Self Care, Joint mobilization, Dry Needling, Electrical stimulation, Spinal mobilization,  Cryotherapy, Moist heat, Manual therapy, and Re-evaluation  PLAN FOR NEXT SESSION: update  HEP as appropriate, progressive shoulder girdle/UE/postural/functional strengthening and ROM exercises as tolerated, manual therapy and dry needling as needed. Education. Consider nerve glides.    Cira Rue, PT, DPT 03/01/2023, 2:45 PM   [facility] [address] [place], [place] [address] P: [phone] I F: [phone]

## 2023-03-06 ENCOUNTER — Encounter: Payer: 59 | Admitting: Physical Therapy

## 2023-03-13 ENCOUNTER — Encounter: Payer: Self-pay | Admitting: Physical Therapy

## 2023-03-13 ENCOUNTER — Ambulatory Visit: Payer: 59 | Attending: Family Medicine | Admitting: Physical Therapy

## 2023-03-13 DIAGNOSIS — M25511 Pain in right shoulder: Secondary | ICD-10-CM | POA: Insufficient documentation

## 2023-03-13 DIAGNOSIS — M6281 Muscle weakness (generalized): Secondary | ICD-10-CM | POA: Diagnosis not present

## 2023-03-13 DIAGNOSIS — G8929 Other chronic pain: Secondary | ICD-10-CM | POA: Insufficient documentation

## 2023-03-13 DIAGNOSIS — M79621 Pain in right upper arm: Secondary | ICD-10-CM | POA: Diagnosis not present

## 2023-03-13 DIAGNOSIS — M25611 Stiffness of right shoulder, not elsewhere classified: Secondary | ICD-10-CM | POA: Diagnosis not present

## 2023-03-13 NOTE — Therapy (Signed)
OUTPATIENT PHYSICAL THERAPY TREATMENT   Patient Name: Joyce Brock MRN: [id number] DOB:July 31, 1965, 57 y.o., female Today's Date: 03/13/2023  END OF SESSION:  PT End of Session - 03/13/23 1526     Visit Number 8    Number of Visits 13    Date for PT Re-Evaluation 04/26/23    Authorization Type [place] [employer] SAVE reporting period from 02/01/2023    Authorization Time Period *only 4 modalities per visit*  VL 25 PT/OT combined  medial review after 25th visit    Authorization - Visit Number 8    Authorization - Number of Visits 25    Progress Note Due on Visit 10    PT Start Time 1524    PT Stop Time 1602    PT Time Calculation (min) 38 min    Activity Tolerance Patient tolerated treatment well;No increased pain    Behavior During Therapy [facility] for tasks assessed/performed               Past Medical History:  Diagnosis Date   Allergy    Anxiety    GERD (gastroesophageal reflux disease)    Hyperlipidemia    Migraines    Past Surgical History:  Procedure Laterality Date   CHOLECYSTECTOMY     Patient Active Problem List   Diagnosis Date Noted   Acute diarrhea 11/22/2022   Acute pain of right shoulder 11/22/2022   Acute pain of left knee 11/22/2022   Tingling in extremities 05/11/2022   GERD (gastroesophageal reflux disease) 01/16/2020   Tachycardia    Vasovagal syncope 01/11/2020   Pulmonary nodule    GAD (generalized anxiety disorder) 10/04/2018   Panic attack 10/04/2018   Atopic dermatitis 01/18/2017   Verruca vulgaris 11/15/2012   Rectal pain 03/29/2012   Palpitations 12/26/2011   HYPERCHOLESTEROLEMIA 02/05/2009   Allergic rhinitis 10/30/2008   PSORIASIS 10/30/2008   URINARY INCONTINENCE, MIXED 10/30/2008    PCP: Excell Seltzer, MD  REFERRING PROVIDER: Excell Seltzer, MD  REFERRING DIAG: acute pain of right shoulder  THERAPY DIAG:  Chronic right shoulder pain  Pain in right upper arm  Stiffness of right shoulder, not elsewhere  classified  Muscle weakness (generalized)  Rationale for Evaluation and Treatment: Rehabilitation  ONSET DATE: 6 months or more from PT evaluation  PERTINENT HISTORY: Patient is a 57 y.o. female who presents to outpatient physical therapy with a referral for medical diagnosis acute pain of right shoulder. This patient's chief complaints consist of pain over the right posterior arm and lateral arm, difficulty using right arm, leading to the following functional deficits: difficulty working, using the computer mouse, reaching, dressing, pickleball, grooming, pushing, making the bed, housework, taking impact through the right UE, reaching (out, behind body, to back seat), sleeping. Relevant past medical history and comorbidities include has HYPERCHOLESTEROLEMIA; ALLERGIC RHINITIS; PSORIASIS; URINARY INCONTINENCE, MIXED; Palpitations; Rectal pain; Verruca vulgaris; Atopic dermatitis; GAD (generalized anxiety disorder); Panic attack; Vasovagal syncope; Pulmonary nodule; Tachycardia; GERD (gastroesophageal reflux disease); Tingling in extremities; Acute diarrhea; Acute pain of right shoulder; and Acute pain of left knee on their problem list. She  has a past medical history of Allergy, Anxiety, GERD (gastroesophageal reflux disease), Hyperlipidemia, and Migraines. She has a past surgical history that includes Cholecystectomy. Patient denies hx of cancer, stroke, seizures, lung problems, heart problems, diabetes, unexplained weight loss, unexplained changes in bowel or bladder problems, unexplained stumbling or dropping things, osteoporosis, and spinal surgery  SUBJECTIVE:  SUBJECTIVE STATEMENT: Patient states she was on vacation last week and she was doing her HEP daily. She was doing one of the exercises (she does not remember  which), she had a feeling like a sudden pinch in the R anterolateral upper arm. She kept going with her HEP but she continued to have pain later. She did not do her HEP for about 3 days. She went back to her HEP yesterday, which was sore, especially when she did shoulder ER with blue band (she was working up from green) but she thought it might be due to not doing her HEP for a while. She also had some tightness feeling when doing push ups. She had one night where she woke up early with her shoulder hurting, but she didn't put any ice on it or anything. She feels like the dry needling helped last PT session.   PAIN:  NPRS: 0/10  PATIENT GOALS: "to be able to do without the hurting and not damaging to the point I would need surgery or anything like that"  NEXT MD VISIT: no follow up for this specific problem, physical in October.   OBJECTIVE  TODAY'S TREATMENT:  Therapeutic exercise: to centralize symptoms and improve ROM, strength, muscular endurance, and activity tolerance required for successful completion of functional activities.  - Upper body ergometer level 7, 5 min total changing directions every 1 minute.  (Dry needling / manual therapy - see below).  - R shoulder flexion AAROM wall slide, 2x10.  - R shoulder abduction AAROM wall slide, 2x10 (pain over deltoid)  Manual therapy: to reduce pain and tissue tension, improve range of motion, neuromodulation, in order to promote improved ability to complete functional activities. HOOKLYING - STM to right subscapularis and latissimus dorsi PRONE - STM to right infraspinatus and supraspinatus  Modality: Dry needling performed to right rotator cuff musculature to decrease pain and spasms along patient's right shoulder region with patient in hooklying/prone/prone utilizing 2/1 dry needle(s) .30/.54mm x 60/40mm with 2 stick at right subscapularis/lat dorsi, 1 stick at right infraspinatus, and 1 stick at right supraspinatus. Patient educated  about the risks and benefits from therapy and verbally consents to treatment.  Dry needling performed by Luretha Murphy. Ilsa Iha PT, DPT who is certified in this technique.  Pt required multimodal cuing for proper technique and to facilitate improved neuromuscular control, strength, range of motion, and functional ability resulting in improved performance and form.  PATIENT EDUCATION: Education details: tyypical prognosis and timeline for recovery to DC.  Education on HEP including handout. Person educated: Patient Education method: Explanation, Demonstration, Tactile cues, Verbal cues, and Handouts Education comprehension: verbalized understanding, returned demonstration, and needs further education  HOME EXERCISE PROGRAM: Access Code: [email or web address] URL: [email or web address] Date: 02/27/2023 Prepared by: Norton Blizzard  Exercises - Row with band/cable  - 1 x daily - 2-3 sets - 10-15 reps - 2 seconds hold - Shoulder External Rotation with Anchored Resistance  - 1 x daily - 2-3 sets - 15 reps - green progressing to blue band - Shoulder Internal Rotation with Resistance  - 1 x daily - 2-3 sets - 15 reps - black band - Standing Shoulder Abduction Slides at Wall  - 1 x daily - 1-2 sets - 10 reps - 4 seconds hold - Scaption Wall Slide with Towel  - 1 x daily - 1-2 sets - 10 reps - 4 seconds hold - Standing Low Trap Setting with Resistance at Wall  - 1 x daily - 2 sets - 10  reps - Push-Up on Counter  - 1 x daily - 2 sets - 15 reps - Standing Single Arm Shoulder Abduction with Resistance  - 1 x daily - 2 sets - 15 reps  ASSESSMENT:  CLINICAL IMPRESSION: Patient arrives reporting excellent participation in HEP while on vacation until she had some sudden pain that resulted in her taking a day off, with difficulty starting again related to motivation. She did start back to HEP yesterday night with success, but she appeared to be feeling a bit less confident about her improvements. Today's session  utilized dry needling and manual therapy to improve range of motion, resulting in patient feeling her shoulder was moving better by end of session but sore from the dry needling. She continues to demonstrate improved AAROM to right shoulder abduction with needling but is limited by pain over the lateral deltoid, which suggests rotator cuff related pain. Patient encouraged to continue with HEP daily and plan to make updates as appropriate and continue with strengthening next PT session. Patient would benefit from continued management of limiting condition by skilled physical therapist to address remaining impairments and functional limitations to work towards stated goals and return to PLOF or maximal functional independence.      OBJECTIVE IMPAIRMENTS: decreased activity tolerance, decreased coordination, decreased endurance, decreased knowledge of condition, difficulty walking, decreased ROM, decreased strength, hypomobility, impaired perceived functional ability, increased muscle spasms, impaired flexibility, impaired UE functional use, improper body mechanics, and pain.   ACTIVITY LIMITATIONS: carrying, lifting, sleeping, bed mobility, bathing, dressing, reach over head, and caring for others  PARTICIPATION LIMITATIONS: meal prep, cleaning, laundry, community activity, occupation, yard work, and   difficulty working, using the Firefighter, reaching, dressing, pickleball, grooming, pushing, making the bed, housework, taking impact through the right UE, reaching (out, behind body, to back seat), sleeping  PERSONAL FACTORS: Age, Past/current experiences, Time since onset of injury/illness/exacerbation, and 3+ comorbidities:   HYPERCHOLESTEROLEMIA; ALLERGIC RHINITIS; PSORIASIS; URINARY INCONTINENCE, MIXED; Palpitations; Rectal pain; Verruca vulgaris; Atopic dermatitis; GAD (generalized anxiety disorder); Panic attack; Vasovagal syncope; Pulmonary nodule; Tachycardia; GERD (gastroesophageal reflux  disease); Tingling in extremities; Acute diarrhea; Acute pain of right shoulder; and Acute pain of left knee on their problem list. She  has a past medical history of Allergy, Anxiety, GERD (gastroesophageal reflux disease), Hyperlipidemia, and Migraines are also affecting patient's functional outcome.   REHAB POTENTIAL: Good  CLINICAL DECISION MAKING: Evolving/moderate complexity  EVALUATION COMPLEXITY: Moderate   GOALS: Goals reviewed with patient? No  SHORT TERM GOALS: Target date: 02/15/2023  Patient will be independent with initial home exercise program for self-management of symptoms. Baseline: Initial HEP provided at IE (02/01/23); Goal status: In-progress  LONG TERM GOALS: Target date: 04/26/2023  Patient will be independent with a long-term home exercise program for self-management of symptoms.  Baseline: Initial HEP provided at IE (02/01/23); Goal status: In-progress  2.  Patient will demonstrate improved FOTO to equal or greater than 68 by visit #10 to demonstrate improvement in overall condition and self-reported functional ability.  Baseline: 61 (02/01/23); 62 at visit #5 (02/21/2023);  Goal status: In-progress  3.  Patient will demonstrate R shoulder AROM equal or greater than L shoulder AROM to improve her ability to complete daily activities such as working, dressing, and playing pickleball.  Baseline: significant limitations - see objective (02/01/23); Goal status: In-progress  4.  Patient will demonstrate R shoulder MMT equal or greater than L shoulder MMT with no increase in concordant pain to improve her ability to complete valued  activities such as dressing, reaching, pushing, working, and pickleball.  Baseline: significant limitations (02/01/23); Goal status: In-progress  5.  Patient will complete community, work and/or recreational activities with 75% less limitation due to current condition.  Baseline: difficulty working, using the computer mouse, reaching,  dressing, pickleball, grooming, pushing, making the bed, housework, taking impact through the right UE, reaching (out, behind body, to back seat), sleeping (02/01/23); Goal status: In-progress  PLAN:  PT FREQUENCY: 1-2x/week  PT DURATION: 12 weeks  PLANNED INTERVENTIONS: Therapeutic exercises, Therapeutic activity, Neuromuscular re-education, Patient/Family education, Self Care, Joint mobilization, Dry Needling, Electrical stimulation, Spinal mobilization, Cryotherapy, Moist heat, Manual therapy, and Re-evaluation  PLAN FOR NEXT SESSION: update HEP as appropriate, progressive shoulder girdle/UE/postural/functional strengthening and ROM exercises as tolerated, manual therapy and dry needling as needed. Education. Consider nerve glides.    Cira Rue, PT, DPT 03/13/2023, 6:54 PM   [facility] [address] [place], [place] [address] P: [phone] I F: [phone]

## 2023-03-14 DIAGNOSIS — J301 Allergic rhinitis due to pollen: Secondary | ICD-10-CM | POA: Diagnosis not present

## 2023-03-15 ENCOUNTER — Encounter: Payer: Self-pay | Admitting: Physical Therapy

## 2023-03-15 ENCOUNTER — Other Ambulatory Visit (INDEPENDENT_AMBULATORY_CARE_PROVIDER_SITE_OTHER): Payer: 59

## 2023-03-15 ENCOUNTER — Ambulatory Visit: Payer: 59 | Admitting: Physical Therapy

## 2023-03-15 ENCOUNTER — Ambulatory Visit: Payer: 59 | Admitting: Clinical

## 2023-03-15 DIAGNOSIS — M79621 Pain in right upper arm: Secondary | ICD-10-CM

## 2023-03-15 DIAGNOSIS — M25611 Stiffness of right shoulder, not elsewhere classified: Secondary | ICD-10-CM

## 2023-03-15 DIAGNOSIS — G8929 Other chronic pain: Secondary | ICD-10-CM

## 2023-03-15 DIAGNOSIS — F411 Generalized anxiety disorder: Secondary | ICD-10-CM | POA: Diagnosis not present

## 2023-03-15 DIAGNOSIS — M25511 Pain in right shoulder: Secondary | ICD-10-CM | POA: Diagnosis not present

## 2023-03-15 DIAGNOSIS — E7800 Pure hypercholesterolemia, unspecified: Secondary | ICD-10-CM | POA: Diagnosis not present

## 2023-03-15 DIAGNOSIS — M6281 Muscle weakness (generalized): Secondary | ICD-10-CM

## 2023-03-15 LAB — COMPREHENSIVE METABOLIC PANEL
ALT: 20 U/L (ref 0–35)
AST: 19 U/L (ref 0–37)
Albumin: 4.2 g/dL (ref 3.5–5.2)
Alkaline Phosphatase: 72 U/L (ref 39–117)
BUN: 11 mg/dL (ref 6–23)
CO2: 29 meq/L (ref 19–32)
Calcium: 9.6 mg/dL (ref 8.4–10.5)
Chloride: 102 meq/L (ref 96–112)
Creatinine, Ser: 0.88 mg/dL (ref 0.40–1.20)
GFR: 72.59 mL/min (ref >60.00)
Glucose, Bld: 97 mg/dL (ref 70–99)
Potassium: 4 meq/L (ref 3.5–5.1)
Sodium: 139 meq/L (ref 135–145)
Total Bilirubin: 0.7 mg/dL (ref 0.2–1.2)
Total Protein: 7.1 g/dL (ref 6.0–8.3)

## 2023-03-15 LAB — LIPID PANEL
Cholesterol: 175 mg/dL (ref 0–200)
HDL: 57.4 mg/dL (ref >39.00)
LDL Cholesterol: 93 mg/dL (ref 0–99)
NonHDL: 117.28
Total CHOL/HDL Ratio: 3
Triglycerides: 119 mg/dL (ref 0.0–149.0)
VLDL: 23.8 mg/dL (ref 0.0–40.0)

## 2023-03-15 NOTE — Progress Notes (Signed)
                Dezi Schaner, LCSW 

## 2023-03-15 NOTE — Progress Notes (Signed)
No critical labs need to be addressed urgently. We will discuss labs in detail at upcoming office visit.   

## 2023-03-15 NOTE — Progress Notes (Signed)
[facility] Counselor/Therapist Progress Note  Patient ID: Joyce Brock, MRN: [id number],    Date: 03/15/2023  Time Spent: 8:45am - 9:41am : 56 minutes   Treatment Type: Individual Therapy  Reported Symptoms: none reported  Mental Status Exam: Appearance:  Neat and Well Groomed     Behavior: Appropriate  Motor: Normal  Speech/Language:  Clear and Coherent  Affect: Appropriate  Mood: normal  Thought process: normal  Thought content:   WNL  Sensory/Perceptual disturbances:   WNL  Orientation: oriented to person, place, and situation  Attention: Good  Concentration: Good  Memory: WNL  Fund of knowledge:  Good  Insight:   Good  Judgment:  Good  Impulse Control: Good   Risk Assessment: Danger to Self:  No Patient denied current suicidal ideation  Self-injurious Behavior: No Danger to Others: No Patient denied current homicidal ideation Duty to Warn:no Physical Aggression / Violence:No  Access to Firearms a concern: No  Gang Involvement:No   Subjective: Patient stated, "its been going good" in response to events since last session. Patient stated, "I hadn't really had any anxiety" in response to patient's mood since last session. Patient stated, "everything seems to be going well". Patient reported she has been watching her granddaughter once a week and patient reported she feels patient/daughter in Social worker are learning each other's personalities. Patient stated, "I think we're getting along more" in regards to patient's relationship with her daughter in law. Patient stated, "not knowing creates more anxiety for things" in regards to patient's daughter's recent trip to out of the country. Patient reported her father has been retaining fluid and reported concern about her father's health. Patient reported her mother has exhibited changes in short term memory and patient reported concern about an incident that occurred when visiting her parents. Patient reported she  did not complete her homework assignment.  Patient reported she feels she has met patient's short term goals 1, 2, 4, and 5. Patient stated, "I'm getting there, I don't know that I have quite achieved that" in response to patient's short term goal #3. Patient stated, "I think sometimes finding the right words is not easiest for me" in response to patient's short term goal # 4.   Interventions: Cognitive Behavioral Therapy. Clinician conducted session in person at clinician's office at [facility]. Reviewed events since last session. Assessed patient's mood since last session and assessed patient's current mood. Reviewed that status of previous stressors. Assisted patient in discussing and identifying thoughts/feelings triggered by daughter's trip out of the country. Discussed current stressors and the impact on patient. Discussed educational resources related to cognitive decline and provided resource information related to the warning signs of Dementia. Reviewed patient's goals for therapy and patient's progress. Clinician requested patient continue thought record for homework and utilize socratic questions to challenge thoughts identified and practice deep breathing, visualization/imagery, mindfulness.    Collaboration of Care: Other not required at this time   Diagnosis:  Generalized anxiety disorder     Plan: Patient is to utilize [employer] Therapy, thought re-framing, relaxation techniques, mindfulness and coping strategies to decrease symptoms associated with Generalized Anxiety Disorder. Frequency: monthly  Modality: individual      Long-term goal:   Reduce overall level, frequency, and intensity of the feelings of anxiety and panic as evidenced by decrease in panic attacks, anxiety, negative thoughts, feeling defensive, difficulty focusing, feeling on edge, lack of motivation, and feeling unproductive from 3 to 5 days/week to 0 to 1 days/week per patient  report for at least  3 consecutive months. Target Date: 09/07/23  Progress: progressing    Short-term goal:  Reduce overall level, frequency, and intensity of feeling overwhelmed, frustration, and irritability in response to the occurrence of unplanned events  Target Date: 03/10/23  Progress: patient reported she feels this goal has been met    Identify triggers for feeling overwhelmed, frustrated, and irritable when unplanned events occur and develop/implement strategies to process thoughts/feelings prior to responding.  Target Date: 03/10/23  Progress: patient reported she feels this goal has been met    Develop effective communication strategies for patient to utilize when expressing her thoughts and feelings to others in a healthy, controlled and assertive way  Target Date: 09/07/23  Progress: progressing    Verbally express an understanding of the relationship between feelings of anxiety and the impact on thinking patterns and behaviors.  Target Date: 03/10/23  Progress: patient reported she feels this goal has been met    Identify, challenge, and replace negative thought patterns that contribute to feelings of  anxiety with positive thoughts and beliefs per patient's report  Target Date: 03/10/23  Progress: patient reported she feels this goal has been met       Doree Barthel, LCSW

## 2023-03-15 NOTE — Therapy (Signed)
OUTPATIENT PHYSICAL THERAPY TREATMENT   Patient Name: Joyce Brock MRN: [id number] DOB:22-Jan-1966, 57 y.o., female Today's Date: 03/15/2023  END OF SESSION:  PT End of Session - 03/15/23 1516     Visit Number 9    Number of Visits 13    Date for PT Re-Evaluation 04/26/23    Authorization Type [place] [employer] SAVE reporting period from 02/01/2023    Authorization Time Period *only 4 modalities per visit*  VL 25 PT/OT combined  medial review after 25th visit    Authorization - Number of Visits 25    Progress Note Due on Visit 10    PT Start Time 1517    PT Stop Time 1555    PT Time Calculation (min) 38 min    Activity Tolerance Patient tolerated treatment well;No increased pain    Behavior During Therapy [facility] for tasks assessed/performed             Past Medical History:  Diagnosis Date   Allergy    Anxiety    GERD (gastroesophageal reflux disease)    Hyperlipidemia    Migraines    Past Surgical History:  Procedure Laterality Date   CHOLECYSTECTOMY     Patient Active Problem List   Diagnosis Date Noted   Acute diarrhea 11/22/2022   Acute pain of right shoulder 11/22/2022   Acute pain of left knee 11/22/2022   Tingling in extremities 05/11/2022   GERD (gastroesophageal reflux disease) 01/16/2020   Tachycardia    Vasovagal syncope 01/11/2020   Pulmonary nodule    GAD (generalized anxiety disorder) 10/04/2018   Panic attack 10/04/2018   Atopic dermatitis 01/18/2017   Verruca vulgaris 11/15/2012   Rectal pain 03/29/2012   Palpitations 12/26/2011   HYPERCHOLESTEROLEMIA 02/05/2009   Allergic rhinitis 10/30/2008   PSORIASIS 10/30/2008   URINARY INCONTINENCE, MIXED 10/30/2008    PCP: Excell Seltzer, MD  REFERRING PROVIDER: Excell Seltzer, MD  REFERRING DIAG: acute pain of right shoulder  THERAPY DIAG:  Chronic right shoulder pain  Pain in right upper arm  Stiffness of right shoulder, not elsewhere classified  Muscle weakness  (generalized)  Rationale for Evaluation and Treatment: Rehabilitation  ONSET DATE: 6 months or more from PT evaluation  PERTINENT HISTORY: Patient is a 57 y.o. female who presents to outpatient physical therapy with a referral for medical diagnosis acute pain of right shoulder. This patient's chief complaints consist of pain over the right posterior arm and lateral arm, difficulty using right arm, leading to the following functional deficits: difficulty working, using the computer mouse, reaching, dressing, pickleball, grooming, pushing, making the bed, housework, taking impact through the right UE, reaching (out, behind body, to back seat), sleeping. Relevant past medical history and comorbidities include has HYPERCHOLESTEROLEMIA; ALLERGIC RHINITIS; PSORIASIS; URINARY INCONTINENCE, MIXED; Palpitations; Rectal pain; Verruca vulgaris; Atopic dermatitis; GAD (generalized anxiety disorder); Panic attack; Vasovagal syncope; Pulmonary nodule; Tachycardia; GERD (gastroesophageal reflux disease); Tingling in extremities; Acute diarrhea; Acute pain of right shoulder; and Acute pain of left knee on their problem list. She  has a past medical history of Allergy, Anxiety, GERD (gastroesophageal reflux disease), Hyperlipidemia, and Migraines. She has a past surgical history that includes Cholecystectomy. Patient denies hx of cancer, stroke, seizures, lung problems, heart problems, diabetes, unexplained weight loss, unexplained changes in bowel or bladder problems, unexplained stumbling or dropping things, osteoporosis, and spinal surgery  SUBJECTIVE:  SUBJECTIVE STATEMENT: Patient states she is feeling well today and her shoulder is doing okay. She was a little sore during her HEP last night but slept fine.   PAIN:  NPRS:  0/10  PATIENT GOALS: "to be able to do without the hurting and not damaging to the point I would need surgery or anything like that"  NEXT MD VISIT: no follow up for this specific problem, physical in October.   OBJECTIVE  TODAY'S TREATMENT:  Therapeutic exercise: to centralize symptoms and improve ROM, strength, muscular endurance, and activity tolerance required for successful completion of functional activities.  - Upper body ergometer level 7, 5 min total changing directions every 1 minute.  - R shoulder flexion AAROM wall slide, 2x10.  - R shoulder abduction AAROM wall slide, 2x10 (pain over deltoid) - Standing rows with scapular 1x15 with 25# cable.  - standing modified push up with plus on sink 1x10 (high plinth too hard).  - standing R shoulder ER with towel under arm, 1x15 with BlueTB - standing shoulder abduction with anchor behind body at feet, 1x15 with YTB.  - standing resisted band walks on wall with  GTB around wrists, 1x10 - Standing rows with scapular 1x15 with 25# cable.  - standing R shoulder ER with towel under arm, 1x15 with BlueTB - standing shoulder abduction with anchor behind body at feet, 1x15 with RTB.  - standing resisted band walks on wall with  GTB around wrists, 1x10 - Standing rows with scapular 1x15 with 25# cable.  - standing R shoulder ER with towel under arm, 1x15 with BlueTB - standing shoulder abduction with anchor behind body at feet, 1x12 with RTB.  - standing resisted band walks on wall with  GTB around wrists, 1x10 - standing doorway pec stretch with arms at approx 45 degrees abduction, B UE, 3x30 seconds (unable to tolerate 90/90 position due to posterior R shoulder pain).   Pt required multimodal cuing for proper technique and to facilitate improved neuromuscular control, strength, range of motion, and functional ability resulting in improved performance and form.  PATIENT EDUCATION: Education details: tyypical prognosis and timeline for  recovery to DC.  Education on HEP including handout. Person educated: Patient Education method: Explanation, Demonstration, Tactile cues, Verbal cues, and Handouts Education comprehension: verbalized understanding, returned demonstration, and needs further education  HOME EXERCISE PROGRAM: Access Code: [email or web address] URL: [email or web address] Date: 02/27/2023 Prepared by: Norton Blizzard  Exercises - Row with band/cable  - 1 x daily - 2-3 sets - 10-15 reps - 2 seconds hold - Shoulder External Rotation with Anchored Resistance  - 1 x daily - 2-3 sets - 15 reps - green progressing to blue band - Shoulder Internal Rotation with Resistance  - 1 x daily - 2-3 sets - 15 reps - black band - Standing Shoulder Abduction Slides at Wall  - 1 x daily - 1-2 sets - 10 reps - 4 seconds hold - Scaption Wall Slide with Towel  - 1 x daily - 1-2 sets - 10 reps - 4 seconds hold - Standing Low Trap Setting with Resistance at Wall  - 1 x daily - 2 sets - 10 reps - Push-Up on Counter  - 1 x daily - 2 sets - 15 reps - Standing Single Arm Shoulder Abduction with Resistance  - 1 x daily - 2 sets - 15 reps  ASSESSMENT:  CLINICAL IMPRESSION: Patient arrives reporting good tolerance to last PT session and HEP. Today's session continued with shoulder girdle and postural strengthening  with progressions as tolerated. Patient demonstrating significantly improved glenohumeral rhythm at the right shoulder and reported appropriate fatigue by end of each exercise set and session. Patient would benefit from continued management of limiting condition by skilled physical therapist to address remaining impairments and functional limitations to work towards stated goals and return to PLOF or maximal functional independence.     OBJECTIVE IMPAIRMENTS: decreased activity tolerance, decreased coordination, decreased endurance, decreased knowledge of condition, difficulty walking, decreased ROM, decreased strength,  hypomobility, impaired perceived functional ability, increased muscle spasms, impaired flexibility, impaired UE functional use, improper body mechanics, and pain.   ACTIVITY LIMITATIONS: carrying, lifting, sleeping, bed mobility, bathing, dressing, reach over head, and caring for others  PARTICIPATION LIMITATIONS: meal prep, cleaning, laundry, community activity, occupation, yard work, and   difficulty working, using the Firefighter, reaching, dressing, pickleball, grooming, pushing, making the bed, housework, taking impact through the right UE, reaching (out, behind body, to back seat), sleeping  PERSONAL FACTORS: Age, Past/current experiences, Time since onset of injury/illness/exacerbation, and 3+ comorbidities:   HYPERCHOLESTEROLEMIA; ALLERGIC RHINITIS; PSORIASIS; URINARY INCONTINENCE, MIXED; Palpitations; Rectal pain; Verruca vulgaris; Atopic dermatitis; GAD (generalized anxiety disorder); Panic attack; Vasovagal syncope; Pulmonary nodule; Tachycardia; GERD (gastroesophageal reflux disease); Tingling in extremities; Acute diarrhea; Acute pain of right shoulder; and Acute pain of left knee on their problem list. She  has a past medical history of Allergy, Anxiety, GERD (gastroesophageal reflux disease), Hyperlipidemia, and Migraines are also affecting patient's functional outcome.   REHAB POTENTIAL: Good  CLINICAL DECISION MAKING: Evolving/moderate complexity  EVALUATION COMPLEXITY: Moderate   GOALS: Goals reviewed with patient? No  SHORT TERM GOALS: Target date: 02/15/2023  Patient will be independent with initial home exercise program for self-management of symptoms. Baseline: Initial HEP provided at IE (02/01/23); Goal status: In-progress  LONG TERM GOALS: Target date: 04/26/2023  Patient will be independent with a long-term home exercise program for self-management of symptoms.  Baseline: Initial HEP provided at IE (02/01/23); Goal status: In-progress  2.  Patient will  demonstrate improved FOTO to equal or greater than 68 by visit #10 to demonstrate improvement in overall condition and self-reported functional ability.  Baseline: 61 (02/01/23); 62 at visit #5 (02/21/2023);  Goal status: In-progress  3.  Patient will demonstrate R shoulder AROM equal or greater than L shoulder AROM to improve her ability to complete daily activities such as working, dressing, and playing pickleball.  Baseline: significant limitations - see objective (02/01/23); Goal status: In-progress  4.  Patient will demonstrate R shoulder MMT equal or greater than L shoulder MMT with no increase in concordant pain to improve her ability to complete valued activities such as dressing, reaching, pushing, working, and pickleball.  Baseline: significant limitations (02/01/23); Goal status: In-progress  5.  Patient will complete community, work and/or recreational activities with 75% less limitation due to current condition.  Baseline: difficulty working, using the computer mouse, reaching, dressing, pickleball, grooming, pushing, making the bed, housework, taking impact through the right UE, reaching (out, behind body, to back seat), sleeping (02/01/23); Goal status: In-progress  PLAN:  PT FREQUENCY: 1-2x/week  PT DURATION: 12 weeks  PLANNED INTERVENTIONS: Therapeutic exercises, Therapeutic activity, Neuromuscular re-education, Patient/Family education, Self Care, Joint mobilization, Dry Needling, Electrical stimulation, Spinal mobilization, Cryotherapy, Moist heat, Manual therapy, and Re-evaluation  PLAN FOR NEXT SESSION: update HEP as appropriate, progressive shoulder girdle/UE/postural/functional strengthening and ROM exercises as tolerated, manual therapy and dry needling as needed. Education. Consider nerve glides.    Cira Rue, PT, DPT 03/15/2023, 5:28  PM   [facility] [address] [place], [place] [address] P: [phone] I F:  [phone]

## 2023-03-19 ENCOUNTER — Ambulatory Visit: Payer: 59 | Admitting: Physical Therapy

## 2023-03-19 ENCOUNTER — Encounter: Payer: Self-pay | Admitting: Physical Therapy

## 2023-03-19 DIAGNOSIS — M25511 Pain in right shoulder: Secondary | ICD-10-CM | POA: Diagnosis not present

## 2023-03-19 DIAGNOSIS — M25611 Stiffness of right shoulder, not elsewhere classified: Secondary | ICD-10-CM | POA: Diagnosis not present

## 2023-03-19 DIAGNOSIS — G8929 Other chronic pain: Secondary | ICD-10-CM | POA: Diagnosis not present

## 2023-03-19 DIAGNOSIS — M6281 Muscle weakness (generalized): Secondary | ICD-10-CM | POA: Diagnosis not present

## 2023-03-19 DIAGNOSIS — M79621 Pain in right upper arm: Secondary | ICD-10-CM

## 2023-03-19 NOTE — Therapy (Signed)
OUTPATIENT PHYSICAL THERAPY TREATMENT / PROGRESS NOTE Dates of reporting from 02/01/2023 to 03/19/2023   Patient Name: Joyce Brock MRN: [id number] DOB:[birth year], 57 y.o., female Today's Date: 03/19/2023  END OF SESSION:  PT End of Session - 03/19/23 1622     Visit Number 10    Number of Visits 13    Date for PT Re-Evaluation 04/26/23    Authorization Type [place] [employer] SAVE reporting period from 02/01/2023    Authorization Time Period *only 4 modalities per visit*  VL 25 PT/OT combined  medial review after 25th visit    Authorization - Visit Number 10    Authorization - Number of Visits 25    Progress Note Due on Visit 10    PT Start Time 1607    PT Stop Time 1641    PT Time Calculation (min) 34 min    Activity Tolerance Patient tolerated treatment well;No increased pain    Behavior During Therapy [facility] for tasks assessed/performed              Past Medical History:  Diagnosis Date   Allergy    Anxiety    GERD (gastroesophageal reflux disease)    Hyperlipidemia    Migraines    Past Surgical History:  Procedure Laterality Date   CHOLECYSTECTOMY     Patient Active Problem List   Diagnosis Date Noted   Acute diarrhea 11/22/2022   Acute pain of right shoulder 11/22/2022   Acute pain of left knee 11/22/2022   Tingling in extremities 05/11/2022   GERD (gastroesophageal reflux disease) 01/16/2020   Tachycardia    Vasovagal syncope 01/11/2020   Pulmonary nodule    GAD (generalized anxiety disorder) 10/04/2018   Panic attack 10/04/2018   Atopic dermatitis 01/18/2017   Verruca vulgaris 11/15/2012   Rectal pain 03/29/2012   Palpitations 12/26/2011   HYPERCHOLESTEROLEMIA 02/05/2009   Allergic rhinitis 10/30/2008   PSORIASIS 10/30/2008   URINARY INCONTINENCE, MIXED 10/30/2008    PCP: Excell Seltzer, MD  REFERRING PROVIDER: Excell Seltzer, MD  REFERRING DIAG: acute pain of right shoulder  THERAPY DIAG:  Chronic right shoulder pain  Pain in right  upper arm  Stiffness of right shoulder, not elsewhere classified  Muscle weakness (generalized)  Rationale for Evaluation and Treatment: Rehabilitation  ONSET DATE: 6 months or more from PT evaluation  PERTINENT HISTORY: Patient is a 57 y.o. female who presents to outpatient physical therapy with a referral for medical diagnosis acute pain of right shoulder. This patient's chief complaints consist of pain over the right posterior arm and lateral arm, difficulty using right arm, leading to the following functional deficits: difficulty working, using the computer mouse, reaching, dressing, pickleball, grooming, pushing, making the bed, housework, taking impact through the right UE, reaching (out, behind body, to back seat), sleeping. Relevant past medical history and comorbidities include has HYPERCHOLESTEROLEMIA; ALLERGIC RHINITIS; PSORIASIS; URINARY INCONTINENCE, MIXED; Palpitations; Rectal pain; Verruca vulgaris; Atopic dermatitis; GAD (generalized anxiety disorder); Panic attack; Vasovagal syncope; Pulmonary nodule; Tachycardia; GERD (gastroesophageal reflux disease); Tingling in extremities; Acute diarrhea; Acute pain of right shoulder; and Acute pain of left knee on their problem list. She  has a past medical history of Allergy, Anxiety, GERD (gastroesophageal reflux disease), Hyperlipidemia, and Migraines. She has a past surgical history that includes Cholecystectomy. Patient denies hx of cancer, stroke, seizures, lung problems, heart problems, diabetes, unexplained weight loss, unexplained changes in bowel or bladder problems, unexplained stumbling or dropping things, osteoporosis, and spinal surgery  SUBJECTIVE:  SUBJECTIVE STATEMENT: Patient states her right shoulder was really sore after last PT session and over  the weekend. She did not do her HEP until Saturday and it went okay and it is feeling much better today. This morning she felt like she could raise her arm and do things better than it has been since she has had a shoulder problem.   PAIN:  NPRS: 0/10  PATIENT GOALS: "to be able to do without the hurting and not damaging to the point I would need surgery or anything like that"  NEXT MD VISIT: no follow up for this specific problem, physical in October.   OBJECTIVE  SELF-REPORTED FUNCTION FOTO score: 71/100 (shoulder questionnaire)  PERIPHERAL JOINT MOTION (in degrees) ACTIVE RANGE OF MOTION (AROM) *Indicates pain 02/01/23 03/19/23 Date  Joint/Motion R/L R/L R/L  Shoulder        Flexion 134*/176 138*/ /  Extension 60*/70 61*/ /  Abduction  90*/180 147*/ /  External rotation 85*/119 90*/ /  Internal rotation L5*/T3 L2*/ /  Comments:  02/01/2023: B elbows, wrists, and hands grossly WFL for basic activities without concordant or any pain.    MUSCLE PERFORMANCE (MMT):  *Indicates pain 02/01/23 03/19/23 Date  Joint/Motion R/L R/L R/L  Shoulder        Flexion 4+*/5 4+/5 /  Abduction (C5) 4+*/5 4+*/5 /  External rotation 4*/4 4+/4 /  Internal rotation 5*/5 5/5 /  Elbow        Flexion (C6) 5/5 / /  Extension (C7) 4+/5 / /  Wrist        Flexion (C7) 4/5 / /  Extension (C6) 4+/5 / /  Hand        Thumb extension (C8) B WFL / /  Finger abduction (T1) B WFL / /  Comments:   TODAY'S TREATMENT:  Therapeutic exercise: to centralize symptoms and improve ROM, strength, muscular endurance, and activity tolerance required for successful completion of functional activities.  - Upper body ergometer level 7, 5 min total changing directions every 1 minute.  - measurements to assess progress (see above).   Circuit: - R shoulder flexion AAROM wall slide, 2x10.  - R shoulder abduction AAROM wall slide, 2x10 (pain over deltoid) - R shoulder FIR stretch AAROM, 2x10 with pillow case.   - Standing  rows with scapular 1x15 with 25# cable.  - standing resisted band walks on wall with  GTB around wrists, 1x10 - standing doorway pec stretch with arms at approx 45 degrees abduction, B UE, 3x30 seconds (unable to tolerate 90/90 position due to posterior R shoulder pain).   Pt required multimodal cuing for proper technique and to facilitate improved neuromuscular control, strength, range of motion, and functional ability resulting in improved performance and form.  PATIENT EDUCATION: Education details: tyypical prognosis and timeline for recovery to DC.  Education on HEP including handout. Person educated: Patient Education method: Explanation, Demonstration, Tactile cues, Verbal cues, and Handouts Education comprehension: verbalized understanding, returned demonstration, and needs further education  HOME EXERCISE PROGRAM: Access Code: [email or web address] URL: [email or web address] Date: 02/27/2023 Prepared by: Norton Blizzard  Exercises - Row with band/cable  - 1 x daily - 2-3 sets - 10-15 reps - 2 seconds hold - Shoulder External Rotation with Anchored Resistance  - 1 x daily - 2-3 sets - 15 reps - green progressing to blue band - Shoulder Internal Rotation with Resistance  - 1 x daily - 2-3 sets - 15 reps - black band - Standing Shoulder Abduction Slides at  Wall  - 1 x daily - 1-2 sets - 10 reps - 4 seconds hold - Scaption Wall Slide with Towel  - 1 x daily - 1-2 sets - 10 reps - 4 seconds hold - Standing Low Trap Setting with Resistance at Wall  - 1 x daily - 2 sets - 10 reps - Push-Up on Counter  - 1 x daily - 2 sets - 15 reps - Standing Single Arm Shoulder Abduction with Resistance  - 1 x daily - 2 sets - 15 reps  HOME EXERCISE PROGRAM [[email or web address]] View at [email or web address]" using code: [email or web address] PROM Shoulder Internal Rotation (IR) with Towel - Up Back -  Repeat 10 Repetitions, Hold 4 Seconds, Complete 2 Sets, Perform 1 Times a Day  ASSESSMENT:  CLINICAL IMPRESSION: Patient  has attended 10 physical therapy sessions since starting current episode of care on 02/01/2023. She is participating in HEP well and demonstrates improvements in AROM, MMT, pain, and FOTO scores. She continues to have limitations in AROM, PROM, strength, and pain that limit her function and has not yet reached her full rehab potential. Plan to continue PT at a rate of 1-2/week with goal of working towards discharge to independent long term HEP. Patient would benefit from continued management of limiting condition by skilled physical therapist to address remaining impairments and functional limitations to work towards stated goals and return to PLOF or maximal functional independence.    OBJECTIVE IMPAIRMENTS: decreased activity tolerance, decreased coordination, decreased endurance, decreased knowledge of condition, difficulty walking, decreased ROM, decreased strength, hypomobility, impaired perceived functional ability, increased muscle spasms, impaired flexibility, impaired UE functional use, improper body mechanics, and pain.   ACTIVITY LIMITATIONS: carrying, lifting, sleeping, bed mobility, bathing, dressing, reach over head, and caring for others  PARTICIPATION LIMITATIONS: meal prep, cleaning, laundry, community activity, occupation, yard work, and   difficulty working, using the Firefighter, reaching, dressing, pickleball, grooming, pushing, making the bed, housework, taking impact through the right UE, reaching (out, behind body, to back seat), sleeping  PERSONAL FACTORS: Age, Past/current experiences, Time since onset of injury/illness/exacerbation, and 3+ comorbidities:   HYPERCHOLESTEROLEMIA; ALLERGIC RHINITIS; PSORIASIS; URINARY INCONTINENCE, MIXED; Palpitations; Rectal pain; Verruca vulgaris; Atopic dermatitis; GAD (generalized anxiety disorder); Panic attack; Vasovagal syncope; Pulmonary nodule; Tachycardia; GERD (gastroesophageal reflux disease); Tingling in extremities; Acute diarrhea;  Acute pain of right shoulder; and Acute pain of left knee on their problem list. She  has a past medical history of Allergy, Anxiety, GERD (gastroesophageal reflux disease), Hyperlipidemia, and Migraines are also affecting patient's functional outcome.   REHAB POTENTIAL: Good  CLINICAL DECISION MAKING: Evolving/moderate complexity  EVALUATION COMPLEXITY: Moderate   GOALS: Goals reviewed with patient? No  SHORT TERM GOALS: Target date: 02/15/2023  Patient will be independent with initial home exercise program for self-management of symptoms. Baseline: Initial HEP provided at IE (02/01/23); Goal status: In-progress  LONG TERM GOALS: Target date: 04/26/2023  Patient will be independent with a long-term home exercise program for self-management of symptoms.  Baseline: Initial HEP provided at IE (02/01/23); currently participating well (03/19/2023);  Goal status: In-progress  2.  Patient will demonstrate improved FOTO to equal or greater than 68 by visit #10 to demonstrate improvement in overall condition and self-reported functional ability.  Baseline: 61 (02/01/23); 62 at visit #5 (02/21/2023);  Goal status: In-progress  3.  Patient will demonstrate R shoulder AROM equal or greater than L shoulder AROM to improve her ability to complete daily activities such as working, dressing, and playing pickleball.  Baseline: significant  limitations - see objective (02/01/23); Goal status: In-progress  4.  Patient will demonstrate R shoulder MMT equal or greater than L shoulder MMT with no increase in concordant pain to improve her ability to complete valued activities such as dressing, reaching, pushing, working, and pickleball.  Baseline: significant limitations (02/01/23); Goal status: In-progress  5.  Patient will complete community, work and/or recreational activities with 75% less limitation due to current condition.  Baseline: difficulty working, using the computer mouse, reaching, dressing,  pickleball, grooming, pushing, making the bed, housework, taking impact through the right UE, reaching (out, behind body, to back seat), sleeping (02/01/23); estimates 75% improvement, but there are still things like reaching behind her back that is not back to where she was (03/19/2023);  Goal status: MET  PLAN:  PT FREQUENCY: 1-2x/week  PT DURATION: 12 weeks  PLANNED INTERVENTIONS: Therapeutic exercises, Therapeutic activity, Neuromuscular re-education, Patient/Family education, Self Care, Joint mobilization, Dry Needling, Electrical stimulation, Spinal mobilization, Cryotherapy, Moist heat, Manual therapy, and Re-evaluation  PLAN FOR NEXT SESSION: update HEP as appropriate, progressive shoulder girdle/UE/postural/functional strengthening and ROM exercises as tolerated, manual therapy and dry needling as needed. Education. Consider nerve glides.    Cira Rue, PT, DPT 03/19/2023, 4:41 PM   [facility] [address] [place], [place] [address] P: [phone] I F: [phone]

## 2023-03-21 ENCOUNTER — Encounter: Payer: Self-pay | Admitting: Gastroenterology

## 2023-03-21 ENCOUNTER — Encounter: Payer: Self-pay | Admitting: Physical Therapy

## 2023-03-21 ENCOUNTER — Ambulatory Visit: Payer: 59 | Admitting: Physical Therapy

## 2023-03-21 DIAGNOSIS — M6281 Muscle weakness (generalized): Secondary | ICD-10-CM

## 2023-03-21 DIAGNOSIS — M25611 Stiffness of right shoulder, not elsewhere classified: Secondary | ICD-10-CM | POA: Diagnosis not present

## 2023-03-21 DIAGNOSIS — G8929 Other chronic pain: Secondary | ICD-10-CM | POA: Diagnosis not present

## 2023-03-21 DIAGNOSIS — M79621 Pain in right upper arm: Secondary | ICD-10-CM

## 2023-03-21 DIAGNOSIS — J301 Allergic rhinitis due to pollen: Secondary | ICD-10-CM | POA: Diagnosis not present

## 2023-03-21 DIAGNOSIS — M25511 Pain in right shoulder: Secondary | ICD-10-CM | POA: Diagnosis not present

## 2023-03-21 NOTE — Therapy (Signed)
OUTPATIENT PHYSICAL THERAPY TREATMENT   Patient Name: Joyce [place] MRN: [id number] DOB:June 21, 1965, 57 y.o., female Today's Date: 03/21/2023  END OF SESSION:  PT End of Session - 03/21/23 1606     Visit Number 11    Number of Visits 13    Date for PT Re-Evaluation 04/26/23    Authorization Type [place] [employer] SAVE reporting period from 03/19/2023    Authorization Time Period *only 4 modalities per visit*  VL 25 PT/OT combined  medial review after 25th visit    Authorization - Visit Number 11    Authorization - Number of Visits 25    Progress Note Due on Visit 10    PT Start Time 1604    PT Stop Time 1642    PT Time Calculation (min) 38 min    Activity Tolerance Patient tolerated treatment well;No increased pain    Behavior During Therapy [facility] for tasks assessed/performed             Past Medical History:  Diagnosis Date   Allergy    Anxiety    GERD (gastroesophageal reflux disease)    Hyperlipidemia    Migraines    Past Surgical History:  Procedure Laterality Date   CHOLECYSTECTOMY     Patient Active Problem List   Diagnosis Date Noted   Acute diarrhea 11/22/2022   Acute pain of right shoulder 11/22/2022   Acute pain of left knee 11/22/2022   Tingling in extremities 05/11/2022   GERD (gastroesophageal reflux disease) 01/16/2020   Tachycardia    Vasovagal syncope 01/11/2020   Pulmonary nodule    GAD (generalized anxiety disorder) 10/04/2018   Panic attack 10/04/2018   Atopic dermatitis 01/18/2017   Verruca vulgaris 11/15/2012   Rectal pain 03/29/2012   Palpitations 12/26/2011   HYPERCHOLESTEROLEMIA 02/05/2009   Allergic rhinitis 10/30/2008   PSORIASIS 10/30/2008   URINARY INCONTINENCE, MIXED 10/30/2008    PCP: Excell Seltzer, MD  REFERRING PROVIDER: Excell Seltzer, MD  REFERRING DIAG: acute pain of right shoulder  THERAPY DIAG:  Chronic right shoulder pain  Pain in right upper arm  Stiffness of right shoulder, not elsewhere  classified  Muscle weakness (generalized)  Rationale for Evaluation and Treatment: Rehabilitation  ONSET DATE: 6 months or more from PT evaluation  PERTINENT HISTORY: Patient is a 57 y.o. female who presents to outpatient physical therapy with a referral for medical diagnosis acute pain of right shoulder. This patient's chief complaints consist of pain over the right posterior arm and lateral arm, difficulty using right arm, leading to the following functional deficits: difficulty working, using the computer mouse, reaching, dressing, pickleball, grooming, pushing, making the bed, housework, taking impact through the right UE, reaching (out, behind body, to back seat), sleeping. Relevant past medical history and comorbidities include has HYPERCHOLESTEROLEMIA; ALLERGIC RHINITIS; PSORIASIS; URINARY INCONTINENCE, MIXED; Palpitations; Rectal pain; Verruca vulgaris; Atopic dermatitis; GAD (generalized anxiety disorder); Panic attack; Vasovagal syncope; Pulmonary nodule; Tachycardia; GERD (gastroesophageal reflux disease); Tingling in extremities; Acute diarrhea; Acute pain of right shoulder; and Acute pain of left knee on their problem list. She  has a past medical history of Allergy, Anxiety, GERD (gastroesophageal reflux disease), Hyperlipidemia, and Migraines. She has a past surgical history that includes Cholecystectomy. Patient denies hx of cancer, stroke, seizures, lung problems, heart problems, diabetes, unexplained weight loss, unexplained changes in bowel or bladder problems, unexplained stumbling or dropping things, osteoporosis, and spinal surgery  SUBJECTIVE:  SUBJECTIVE STATEMENT: Patient states she is feeling well today with no pain. She was sore after last PT session and started on the updated HEP yesterday, which  "was pretty rough" (tired/sore, resolved not long after she finished).   PAIN:  NPRS: 0/10  PATIENT GOALS: "to be able to do without the hurting and not damaging to the point I would need surgery or anything like that"  NEXT MD VISIT: no follow up for this specific problem, physical in October.   OBJECTIVE  TODAY'S TREATMENT:  Therapeutic exercise: to centralize symptoms and improve ROM, strength, muscular endurance, and activity tolerance required for successful completion of functional activities.  - Upper body ergometer level 7, 5 min total changing directions every 1 minute.   Circuit: - R shoulder flexion AAROM wall slide, 2x10.  - R shoulder abduction AAROM wall slide, 2x10 - R shoulder FIR stretch AAROM, 2x10 with pillow case.  Cuing for improved position.   Circuit: - Standing rows with scapular 3x15 with 25# cable.  - standing resisted band walks on wall with  GTB around wrists, 3x10 - standing doorway pec stretch with arms at approx 45 degrees abduction, B UE, 3x30 seconds.    Pt required multimodal cuing for proper technique and to facilitate improved neuromuscular control, strength, range of motion, and functional ability resulting in improved performance and form.  PATIENT EDUCATION: Education details: tyypical prognosis and timeline for recovery to DC.  Education on HEP including handout. Person educated: Patient Education method: Explanation, Demonstration, Tactile cues, Verbal cues, and Handouts Education comprehension: verbalized understanding, returned demonstration, and needs further education  HOME EXERCISE PROGRAM: Access Code: [email or web address] URL: [email or web address] Date: 02/27/2023 Prepared by: Norton Blizzard  Exercises - Row with band/cable  - 1 x daily - 2-3 sets - 10-15 reps - 2 seconds hold - Shoulder External Rotation with Anchored Resistance  - 1 x daily - 2-3 sets - 15 reps - green progressing to blue band - Shoulder Internal Rotation  with Resistance  - 1 x daily - 2-3 sets - 15 reps - black band - Standing Shoulder Abduction Slides at Wall  - 1 x daily - 1-2 sets - 10 reps - 4 seconds hold - Scaption Wall Slide with Towel  - 1 x daily - 1-2 sets - 10 reps - 4 seconds hold - Standing Low Trap Setting with Resistance at Wall  - 1 x daily - 2 sets - 10 reps - Push-Up on Counter  - 1 x daily - 2 sets - 15 reps - Standing Single Arm Shoulder Abduction with Resistance  - 1 x daily - 2 sets - 15 reps  HOME EXERCISE PROGRAM [[email or web address]] View at "[email or web address]" using code: [email or web address] PROM Shoulder Internal Rotation (IR) with Towel - Up Back -  Repeat 10 Repetitions, Hold 4 Seconds, Complete 2 Sets, Perform 1 Times a Day  ASSESSMENT:  CLINICAL IMPRESSION: Patient arrives reporting strong fatigue but good tolerance to updates in HEP from last PT session. Today's session continued working on progressive strengthening with minimal progressions due to appropriate challenge level. Patient demonstrating improved activity tolerance and ROM per observation. Patient would benefit from continued management of limiting condition by skilled physical therapist to address remaining impairments and functional limitations to work towards stated goals and return to PLOF or maximal functional independence.     OBJECTIVE IMPAIRMENTS: decreased activity tolerance, decreased coordination, decreased endurance, decreased knowledge of condition, difficulty walking, decreased ROM, decreased strength, hypomobility, impaired perceived functional ability, increased muscle spasms, impaired flexibility,  impaired UE functional use, improper body mechanics, and pain.   ACTIVITY LIMITATIONS: carrying, lifting, sleeping, bed mobility, bathing, dressing, reach over head, and caring for others  PARTICIPATION LIMITATIONS: meal prep, cleaning, laundry, community activity, occupation, yard work, and   difficulty working, using the Firefighter, reaching, dressing,  pickleball, grooming, pushing, making the bed, housework, taking impact through the right UE, reaching (out, behind body, to back seat), sleeping  PERSONAL FACTORS: Age, Past/current experiences, Time since onset of injury/illness/exacerbation, and 3+ comorbidities:   HYPERCHOLESTEROLEMIA; ALLERGIC RHINITIS; PSORIASIS; URINARY INCONTINENCE, MIXED; Palpitations; Rectal pain; Verruca vulgaris; Atopic dermatitis; GAD (generalized anxiety disorder); Panic attack; Vasovagal syncope; Pulmonary nodule; Tachycardia; GERD (gastroesophageal reflux disease); Tingling in extremities; Acute diarrhea; Acute pain of right shoulder; and Acute pain of left knee on their problem list. She  has a past medical history of Allergy, Anxiety, GERD (gastroesophageal reflux disease), Hyperlipidemia, and Migraines are also affecting patient's functional outcome.   REHAB POTENTIAL: Good  CLINICAL DECISION MAKING: Evolving/moderate complexity  EVALUATION COMPLEXITY: Moderate   GOALS: Goals reviewed with patient? No  SHORT TERM GOALS: Target date: 02/15/2023  Patient will be independent with initial home exercise program for self-management of symptoms. Baseline: Initial HEP provided at IE (02/01/23); Goal status: In-progress  LONG TERM GOALS: Target date: 04/26/2023  Patient will be independent with a long-term home exercise program for self-management of symptoms.  Baseline: Initial HEP provided at IE (02/01/23); currently participating well (03/19/2023);  Goal status: In-progress  2.  Patient will demonstrate improved FOTO to equal or greater than 68 by visit #10 to demonstrate improvement in overall condition and self-reported functional ability.  Baseline: 61 (02/01/23); 62 at visit #5 (02/21/2023);  Goal status: In-progress  3.  Patient will demonstrate R shoulder AROM equal or greater than L shoulder AROM to improve her ability to complete daily activities such as working, dressing, and playing pickleball.   Baseline: significant limitations - see objective (02/01/23); Goal status: In-progress  4.  Patient will demonstrate R shoulder MMT equal or greater than L shoulder MMT with no increase in concordant pain to improve her ability to complete valued activities such as dressing, reaching, pushing, working, and pickleball.  Baseline: significant limitations (02/01/23); Goal status: In-progress  5.  Patient will complete community, work and/or recreational activities with 75% less limitation due to current condition.  Baseline: difficulty working, using the computer mouse, reaching, dressing, pickleball, grooming, pushing, making the bed, housework, taking impact through the right UE, reaching (out, behind body, to back seat), sleeping (02/01/23); estimates 75% improvement, but there are still things like reaching behind her back that is not back to where she was (03/19/2023);  Goal status: MET  PLAN:  PT FREQUENCY: 1-2x/week  PT DURATION: 12 weeks  PLANNED INTERVENTIONS: Therapeutic exercises, Therapeutic activity, Neuromuscular re-education, Patient/Family education, Self Care, Joint mobilization, Dry Needling, Electrical stimulation, Spinal mobilization, Cryotherapy, Moist heat, Manual therapy, and Re-evaluation  PLAN FOR NEXT SESSION: update HEP as appropriate, progressive shoulder girdle/UE/postural/functional strengthening and ROM exercises as tolerated, manual therapy and dry needling as needed. Education. Consider nerve glides.    Cira Rue, PT, DPT 03/21/2023, 4:54 PM   [facility] [address] [place], [place] [address] P: [phone] I F: [phone]

## 2023-03-22 ENCOUNTER — Ambulatory Visit (INDEPENDENT_AMBULATORY_CARE_PROVIDER_SITE_OTHER): Payer: 59 | Admitting: Family Medicine

## 2023-03-22 ENCOUNTER — Other Ambulatory Visit: Payer: Self-pay

## 2023-03-22 VITALS — BP 110/62 | HR 67 | Temp 98.7°F | Ht 66.0 in | Wt 166.0 lb

## 2023-03-22 DIAGNOSIS — Z0000 Encounter for general adult medical examination without abnormal findings: Secondary | ICD-10-CM

## 2023-03-22 DIAGNOSIS — F411 Generalized anxiety disorder: Secondary | ICD-10-CM | POA: Diagnosis not present

## 2023-03-22 DIAGNOSIS — E7800 Pure hypercholesterolemia, unspecified: Secondary | ICD-10-CM | POA: Diagnosis not present

## 2023-03-22 DIAGNOSIS — R000 Tachycardia, unspecified: Secondary | ICD-10-CM

## 2023-03-22 DIAGNOSIS — Z1231 Encounter for screening mammogram for malignant neoplasm of breast: Secondary | ICD-10-CM

## 2023-03-22 MED ORDER — ATORVASTATIN CALCIUM 10 MG PO TABS
10.0000 mg | ORAL_TABLET | ORAL | 3 refills | Status: DC
  Filled 2023-03-22 – 2023-08-27 (×2): qty 45, 90d supply, fill #0
  Filled 2023-11-29: qty 45, 90d supply, fill #1
  Filled 2024-02-20: qty 45, 90d supply, fill #2

## 2023-03-22 NOTE — Patient Instructions (Addendum)
Please call the location of your choice from the menu below to schedule your Mammogram and/or Bone Density appointment.    [facility]   [facility]                      Phone:  [phone] [address]                               [place], [place] [address]                                                             Services: Traditional and 3D Mammogram, Bone Density   [facility]                 Phone: [phone] [address]                                                       [place], [place] [address]    Service: Bone Density ONLY   *this site does NOT perform mammograms  [facility]                        Phone:  [phone] [address]                                  [place], [place] [address]                                            Services:  3D Mammogram and Bone Density   Call to set up colonoscopy: [facility]  [phone]

## 2023-03-22 NOTE — Assessment & Plan Note (Addendum)
Chronic, recurrent, well controlled   Paxil 10 mg daily

## 2023-03-22 NOTE — Assessment & Plan Note (Signed)
Chronic, evaluation completed by cardiology.  Well-managed with Toprol-XL 25 mg p.o. daily.  Related to GAD

## 2023-03-22 NOTE — Assessment & Plan Note (Addendum)
Chronic, well controlled with LDL at goal < 100 on atorvastatin 10 mg  every other daily.  New addition of zetia in last 3 months. 10-year ASCVD risk score is 1.7%  atorvastatin 10 mg daily

## 2023-03-22 NOTE — Progress Notes (Signed)
Patient ID: Joyce [place], female    DOB: 1966-04-01, 57 y.o.   MRN: [id number]  This visit was conducted in person.  BP 110/62   Pulse 67   Temp 98.7 F (37.1 C)   Ht 5\' 6"  (1.676 m)   Wt 166 lb (75.3 kg)   LMP 03/23/2013   SpO2 100%   BMI 26.79 kg/m    CC:  Chief Complaint  Patient presents with   Annual Exam    Subjective:   HPI: Joyce [place] is a 57 y.o. female presenting on 03/22/2023 for Annual Exam The patient presents for complete physical and review of chronic health problems. He/She also has the following acute concerns today:   Feeling well overall.  GAD , significant improvement with paxil and therapy ( q4 weeks)    03/22/2023    8:29 AM 05/11/2022   11:33 AM 03/10/2021   10:30 AM 10/18/2018   10:18 AM  GAD 7 : Generalized Anxiety Score  Nervous, Anxious, on Edge 0 3 1 1   Control/stop worrying 0 3 0 1  Worry too much - different things 0 2 0 1  Trouble relaxing 0 2 1 1   Restless 0 0 1 1  Easily annoyed or irritable 0 1 1 0  Afraid - awful might happen 0 3 0 0  Total GAD 7 Score 0 14 4 5   Anxiety Difficulty Not difficult at all Somewhat difficult Not difficult at all      [facility] Visit from 03/22/2023 in [facility]  PHQ-2 Total Score 0       Diet: eating out Exercise: minimal exercise Wt Readings from Last 3 Encounters:  03/22/23 166 lb (75.3 kg)  11/22/22 163 lb (73.9 kg)  07/26/22 164 lb (74.4 kg)    Elevated Cholesterol:  On atorvastatin 10 mg  every other day ( had SE at daily dose), and zetia  Lab Results  Component Value Date   CHOL 175 03/15/2023   HDL 57.40 03/15/2023   LDLCALC 93 03/15/2023   LDLDIRECT 168.7 03/22/2012   TRIG 119.0 03/15/2023   CHOLHDL 3 03/15/2023  The 10-year ASCVD risk score (Arnett DK, et al., 2019) is: 1.7%   Values used to calculate the score:     Age: 57 years     Sex: Female     Is Non-Hispanic African American: No     Diabetic: No      Tobacco smoker: No     Systolic Blood Pressure: 110 mmHg     Is BP treated: No     HDL Cholesterol: 57.4 mg/dL     Total Cholesterol: 175 mg/dL Using medications without problems: none Muscle aches:  none Diet compliance: heart healthy diet, less eating out Exercise:   going to PT for her arm, plans to restart. Other complaints:   Palpitations: On B Blocker per cardiology.. no spells of rapid heart rate in last several months.  Very closely related to anxiety.,,  improved with anxiety.      Relevant past medical, surgical, family and social history reviewed and updated as indicated. Interim medical history since our last visit reviewed. Allergies and medications reviewed and updated. Outpatient Medications Prior to Visit  Medication Sig Dispense Refill   acetaminophen (TYLENOL) 500 MG tablet Take 1,000 mg by mouth every 6 (six) hours as needed.     Calcium Carbonate-Vit D-Min (CALCIUM 600+D PLUS MINERALS) 600-400 MG-UNIT TABS Take 2 tablets by mouth daily. (  Patient taking differently: Take 1 tablet by mouth daily.) 180 tablet 0   cetirizine (ZYRTEC) 10 MG tablet Take 1 tablet by mouth once daily as needed 60 tablet 14   EPINEPHrine 0.3 mg/0.3 mL IJ SOAJ injection Inject into the muscle.     ezetimibe (ZETIA) 10 MG tablet Take 1 tablet (10 mg total) by mouth daily. 90 tablet 3   fluticasone (FLONASE) 50 MCG/ACT nasal spray Use 2 sprays in each nostril once daily 16 g 12   metoprolol succinate (TOPROL XL) 25 MG 24 hr tablet Take 1 tablet (25 mg total) by mouth daily. 90 tablet 1   PARoxetine (PAXIL) 10 MG tablet Take 1 tablet (10 mg total) by mouth daily. 90 tablet 0   pimecrolimus (ELIDEL) 1 % cream Apply twice daily to eyelids at first sign of symptoms. Use until clear. 30 g 3   vitamin B-12 (CYANOCOBALAMIN) 1000 MCG tablet Take 1 tablet (1,000 mcg total) by mouth daily. 90 tablet 0   atorvastatin (LIPITOR) 10 MG tablet Take 1 tablet (10 mg total) by mouth daily. (Patient taking  differently: Take 10 mg by mouth daily. One every other day) 90 tablet 1   magic mouthwash (nystatin, hydrocortisone, diphenhydrAMINE) suspension Swish , gargle and spit 10 ml by mouth 3 times a day for 10 days 300 mL 4   No facility-administered medications prior to visit.     Per HPI unless specifically indicated in ROS section below Review of Systems  Constitutional:  Negative for fatigue and fever.  HENT:  Negative for congestion.   Eyes:  Negative for pain.  Respiratory:  Negative for cough and shortness of breath.   Cardiovascular:  Negative for chest pain, palpitations and leg swelling.  Gastrointestinal:  Negative for abdominal pain.  Genitourinary:  Negative for dysuria and vaginal bleeding.  Musculoskeletal:  Negative for back pain.  Neurological:  Negative for syncope, light-headedness and headaches.  Psychiatric/Behavioral:  Negative for dysphoric mood.    Objective:  BP 110/62   Pulse 67   Temp 98.7 F (37.1 C)   Ht 5\' 6"  (1.676 m)   Wt 166 lb (75.3 kg)   LMP 03/23/2013   SpO2 100%   BMI 26.79 kg/m   Wt Readings from Last 3 Encounters:  03/22/23 166 lb (75.3 kg)  11/22/22 163 lb (73.9 kg)  07/26/22 164 lb (74.4 kg)      Physical Exam Vitals and nursing note reviewed.  Constitutional:      General: She is not in acute distress.    Appearance: Normal appearance. She is well-developed. She is not ill-appearing or toxic-appearing.  HENT:     Head: Normocephalic.     Right Ear: Hearing, tympanic membrane, ear canal and external ear normal.     Left Ear: Hearing, tympanic membrane, ear canal and external ear normal.     Nose: Nose normal.  Eyes:     General: Lids are normal. Lids are everted, no foreign bodies appreciated.     Conjunctiva/sclera: Conjunctivae normal.     Pupils: Pupils are equal, round, and reactive to light.  Neck:     Thyroid: No thyroid mass or thyromegaly.     Vascular: No carotid bruit.     Trachea: Trachea normal.  Cardiovascular:      Rate and Rhythm: Normal rate and regular rhythm.     Heart sounds: Normal heart sounds, S1 normal and S2 normal. No murmur heard.    No gallop.  Pulmonary:     Effort:  Pulmonary effort is normal. No respiratory distress.     Breath sounds: Normal breath sounds. No wheezing, rhonchi or rales.  Abdominal:     General: Bowel sounds are normal. There is no distension or abdominal bruit.     Palpations: Abdomen is soft. There is no fluid wave or mass.     Tenderness: There is no abdominal tenderness. There is no guarding or rebound.     Hernia: No hernia is present.  Musculoskeletal:     Cervical back: Normal range of motion and neck supple.  Lymphadenopathy:     Cervical: No cervical adenopathy.  Skin:    General: Skin is warm and dry.     Findings: No rash.  Neurological:     Mental Status: She is alert.     Cranial Nerves: No cranial nerve deficit.     Sensory: No sensory deficit.  Psychiatric:        Mood and Affect: Mood is not anxious or depressed.        Speech: Speech normal.        Behavior: Behavior normal. Behavior is cooperative.        Judgment: Judgment normal.       Results for orders placed or performed in visit on 03/15/23  Lipid panel  Result Value Ref Range   Cholesterol 175 0 - 200 mg/dL   Triglycerides 366.4 0.0 - 149.0 mg/dL   HDL 40.34 >74.25 mg/dL   VLDL 95.6 0.0 - 38.7 mg/dL   LDL Cholesterol 93 0 - 99 mg/dL   Total CHOL/HDL Ratio 3    NonHDL 117.28   Comprehensive metabolic panel  Result Value Ref Range   Sodium 139 135 - 145 mEq/L   Potassium 4.0 3.5 - 5.1 mEq/L   Chloride 102 96 - 112 mEq/L   CO2 29 19 - 32 mEq/L   Glucose, Bld 97 70 - 99 mg/dL   BUN 11 6 - 23 mg/dL   Creatinine, Ser 5.64 0.40 - 1.20 mg/dL   Total Bilirubin 0.7 0.2 - 1.2 mg/dL   Alkaline Phosphatase 72 39 - 117 U/L   AST 19 0 - 37 U/L   ALT 20 0 - 35 U/L   Total Protein 7.1 6.0 - 8.3 g/dL   Albumin 4.2 3.5 - 5.2 g/dL   GFR 33.29 >51.88 mL/min   Calcium 9.6 8.4 - 10.5  mg/dL     COVID 19 screen:  No recent travel or known exposure to COVID19 The patient denies respiratory symptoms of COVID 19 at this time. The importance of social distancing was discussed today.   Assessment and Plan   The patient's preventative maintenance and recommended screening tests for an annual wellness exam were reviewed in full today. Brought up to date unless services declined.  Counselled on the importance of diet, exercise, and its role in overall health and mortality. The patient's FH and SH was reviewed, including their home life, tobacco status, and drug and alcohol status.   Vaccines: Uptodate COVID series x 3 ,  Tdap, consider shingrix, will get  flu at work  Mammo: 04/2022 nml, sister with breast cancer. BRCA1 and BRCA 2 neg. DUE  Smoking:no  STD screen/ HIV : refused.  PAP/DVE: 02/2020 normal with neg HPV Colon: colonoscopy 10.2019 repeat in 5 years.  Hep C:  neg  ETOH: none  Problem List Items Addressed This Visit     GAD (generalized anxiety disorder)    Chronic, recurrent, well controlled   Paxil 10  mg daily      HYPERCHOLESTEROLEMIA    Chronic, well controlled with LDL at goal < 100 on atorvastatin 10 mg  every other daily.  New addition of zetia in last 3 months. 10-year ASCVD risk score is 1.7%  atorvastatin 10 mg daily      Relevant Medications   atorvastatin (LIPITOR) 10 MG tablet   Tachycardia    Chronic, evaluation completed by cardiology.  Well-managed with Toprol-XL 25 mg p.o. daily.  Related to GAD      Other Visit Diagnoses     Routine general medical examination at a health care facility    -  Primary   Encounter for screening mammogram for malignant neoplasm of breast           Kerby Nora, MD

## 2023-03-26 ENCOUNTER — Encounter: Payer: 59 | Admitting: Physical Therapy

## 2023-03-28 ENCOUNTER — Encounter: Payer: Self-pay | Admitting: Physical Therapy

## 2023-03-28 ENCOUNTER — Ambulatory Visit: Payer: 59 | Admitting: Physical Therapy

## 2023-03-28 DIAGNOSIS — G8929 Other chronic pain: Secondary | ICD-10-CM

## 2023-03-28 DIAGNOSIS — M25611 Stiffness of right shoulder, not elsewhere classified: Secondary | ICD-10-CM | POA: Diagnosis not present

## 2023-03-28 DIAGNOSIS — M79621 Pain in right upper arm: Secondary | ICD-10-CM

## 2023-03-28 DIAGNOSIS — M25511 Pain in right shoulder: Secondary | ICD-10-CM | POA: Diagnosis not present

## 2023-03-28 DIAGNOSIS — J301 Allergic rhinitis due to pollen: Secondary | ICD-10-CM | POA: Diagnosis not present

## 2023-03-28 DIAGNOSIS — M6281 Muscle weakness (generalized): Secondary | ICD-10-CM | POA: Diagnosis not present

## 2023-03-28 NOTE — Therapy (Signed)
OUTPATIENT PHYSICAL THERAPY TREATMENT   Patient Name: Joyce Brock MRN: [id number] DOB:[birth year], 57 y.o., female Today's Date: 03/28/2023  END OF SESSION:  PT End of Session - 03/28/23 1604     Visit Number 12    Number of Visits 13    Date for PT Re-Evaluation 04/26/23    Authorization Type [place] [employer] SAVE reporting period from 03/19/2023    Authorization Time Period *only 4 modalities per visit*  VL 25 PT/OT combined  medial review after 25th visit    Authorization - Visit Number 12    Authorization - Number of Visits 25    Progress Note Due on Visit 10    PT Start Time 1604    PT Stop Time 1645    PT Time Calculation (min) 41 min    Activity Tolerance Patient tolerated treatment well;No increased pain    Behavior During Therapy [facility] for tasks assessed/performed              Past Medical History:  Diagnosis Date   Allergy    Anxiety    GERD (gastroesophageal reflux disease)    Hyperlipidemia    Migraines    Past Surgical History:  Procedure Laterality Date   CHOLECYSTECTOMY     Patient Active Problem List   Diagnosis Date Noted   GERD (gastroesophageal reflux disease) 01/16/2020   Tachycardia    Pulmonary nodule    GAD (generalized anxiety disorder) 10/04/2018   Panic attack 10/04/2018   Atopic dermatitis 01/18/2017   Verruca vulgaris 11/15/2012   HYPERCHOLESTEROLEMIA 02/05/2009   Allergic rhinitis 10/30/2008   PSORIASIS 10/30/2008   URINARY INCONTINENCE, MIXED 10/30/2008    PCP: Excell Seltzer, MD  REFERRING PROVIDER: Excell Seltzer, MD  REFERRING DIAG: acute pain of right shoulder  THERAPY DIAG:  Chronic right shoulder pain  Pain in right upper arm  Stiffness of right shoulder, not elsewhere classified  Muscle weakness (generalized)  Rationale for Evaluation and Treatment: Rehabilitation  ONSET DATE: 6 months or more from PT evaluation  PERTINENT HISTORY: Patient is a 57 y.o. female who presents to outpatient physical  therapy with a referral for medical diagnosis acute pain of right shoulder. This patient's chief complaints consist of pain over the right posterior arm and lateral arm, difficulty using right arm, leading to the following functional deficits: difficulty working, using the computer mouse, reaching, dressing, pickleball, grooming, pushing, making the bed, housework, taking impact through the right UE, reaching (out, behind body, to back seat), sleeping. Relevant past medical history and comorbidities include has HYPERCHOLESTEROLEMIA; ALLERGIC RHINITIS; PSORIASIS; URINARY INCONTINENCE, MIXED; Palpitations; Rectal pain; Verruca vulgaris; Atopic dermatitis; GAD (generalized anxiety disorder); Panic attack; Vasovagal syncope; Pulmonary nodule; Tachycardia; GERD (gastroesophageal reflux disease); Tingling in extremities; Acute diarrhea; Acute pain of right shoulder; and Acute pain of left knee on their problem list. She  has a past medical history of Allergy, Anxiety, GERD (gastroesophageal reflux disease), Hyperlipidemia, and Migraines. She has a past surgical history that includes Cholecystectomy. Patient denies hx of cancer, stroke, seizures, lung problems, heart problems, diabetes, unexplained weight loss, unexplained changes in bowel or bladder problems, unexplained stumbling or dropping things, osteoporosis, and spinal surgery  SUBJECTIVE:  SUBJECTIVE STATEMENT: Patient states she is feeling well today with no pain upon arrival. She had some pain at the right neck the day after last PT session and a little this morning. She did some neck stretches this morning and it is feeling better now. She states the [facility] exercise is getting a little better but still hurts a lot. She is going camping this weekend.   PAIN:  NPRS: 0/10  PATIENT  GOALS: "to be able to do without the hurting and not damaging to the point I would need surgery or anything like that"  NEXT MD VISIT: no follow up for this specific problem  OBJECTIVE  TODAY'S TREATMENT:  Therapeutic exercise: to centralize symptoms and improve ROM, strength, muscular endurance, and activity tolerance required for successful completion of functional activities.  - Upper body ergometer level 7, 5 min total changing directions every 1 minute.  - R shoulder flexion AAROM wall slide, 2x10.  - R shoulder abduction AAROM wall slide, 2x10  Manual therapy: to reduce pain and tissue tension, improve range of motion, neuromodulation, in order to promote improved ability to complete functional activities. SUPINE/HOOKLYING - R GHJ mobilizations, grade III-IV, 2x30 seconds AP glide at 30 degrees abduction, 3x30 seconds caudal glide at >90 degrees abduction, 2x30 seconds distraction at 40 degrees scaption.  - PROM right shoulder flexion and scaption periodically to check ROM and end range pain (present) between joint mobilization bouts.  - STM to right upper trap  Modality: Dry needling performed to right upper trap to decrease pain and spasms along patient's right neck region with patient in supine utilizing 1 dry needle(s) .25mm x 40mm with 2-3 sticks at right upper trap. Patient educated about the risks and benefits from therapy and verbally consents to treatment.  Dry needling performed by Luretha Murphy. Ilsa Iha PT, DPT who is certified in this technique.   Pt required multimodal cuing for proper technique and to facilitate improved neuromuscular control, strength, range of motion, and functional ability resulting in improved performance and form.  PATIENT EDUCATION: Education details: tyypical prognosis and timeline for recovery to DC.  Education on HEP including handout. Person educated: Patient Education method: Explanation, Demonstration, Tactile cues, Verbal cues, and  Handouts Education comprehension: verbalized understanding, returned demonstration, and needs further education  HOME EXERCISE PROGRAM: Access Code: [email or web address] URL: [email or web address] Date: 02/27/2023 Prepared by: Norton Blizzard  Exercises - Row with band/cable  - 1 x daily - 2-3 sets - 10-15 reps - 2 seconds hold - Shoulder External Rotation with Anchored Resistance  - 1 x daily - 2-3 sets - 15 reps - green progressing to blue band - Shoulder Internal Rotation with Resistance  - 1 x daily - 2-3 sets - 15 reps - black band - Standing Shoulder Abduction Slides at Wall  - 1 x daily - 1-2 sets - 10 reps - 4 seconds hold - Scaption Wall Slide with Towel  - 1 x daily - 1-2 sets - 10 reps - 4 seconds hold - Standing Low Trap Setting with Resistance at Wall  - 1 x daily - 2 sets - 10 reps - Push-Up on Counter  - 1 x daily - 2 sets - 15 reps - Standing Single Arm Shoulder Abduction with Resistance  - 1 x daily - 2 sets - 15 reps  HOME EXERCISE PROGRAM [[email or web address]] View at "[email or web address]" using code: [email or web address] PROM Shoulder Internal Rotation (IR) with Towel - Up Back -  Repeat 10 Repetitions, Hold 4 Seconds, Complete 2 Sets, Perform  1 Times a Day  ASSESSMENT:  CLINICAL IMPRESSION: Patient arrives with improving symptoms and activity tolerance. Today's session focused more on joint mobilization and soft tissue interventions to facilitate improved ROM with less pain. Patient continues to be limited in overhead movement by end range stiffness with discomfort over the lateral shoulder. She also had increased tension and concordant pain with palpation to the right upper trap, which was addressed with STM and dry needling. Patient appears to be performing her HEP faithfully. Plan to update HEP as appropriate and continue working improved ROM and pain reduction as appropriate next session. Patient would benefit from continued management of limiting condition by skilled physical therapist to  address remaining impairments and functional limitations to work towards stated goals and return to PLOF or maximal functional independence.   OBJECTIVE IMPAIRMENTS: decreased activity tolerance, decreased coordination, decreased endurance, decreased knowledge of condition, difficulty walking, decreased ROM, decreased strength, hypomobility, impaired perceived functional ability, increased muscle spasms, impaired flexibility, impaired UE functional use, improper body mechanics, and pain.   ACTIVITY LIMITATIONS: carrying, lifting, sleeping, bed mobility, bathing, dressing, reach over head, and caring for others  PARTICIPATION LIMITATIONS: meal prep, cleaning, laundry, community activity, occupation, yard work, and   difficulty working, using the Firefighter, reaching, dressing, pickleball, grooming, pushing, making the bed, housework, taking impact through the right UE, reaching (out, behind body, to back seat), sleeping  PERSONAL FACTORS: Age, Past/current experiences, Time since onset of injury/illness/exacerbation, and 3+ comorbidities:   HYPERCHOLESTEROLEMIA; ALLERGIC RHINITIS; PSORIASIS; URINARY INCONTINENCE, MIXED; Palpitations; Rectal pain; Verruca vulgaris; Atopic dermatitis; GAD (generalized anxiety disorder); Panic attack; Vasovagal syncope; Pulmonary nodule; Tachycardia; GERD (gastroesophageal reflux disease); Tingling in extremities; Acute diarrhea; Acute pain of right shoulder; and Acute pain of left knee on their problem list. She  has a past medical history of Allergy, Anxiety, GERD (gastroesophageal reflux disease), Hyperlipidemia, and Migraines are also affecting patient's functional outcome.   REHAB POTENTIAL: Good  CLINICAL DECISION MAKING: Evolving/moderate complexity  EVALUATION COMPLEXITY: Moderate   GOALS: Goals reviewed with patient? No  SHORT TERM GOALS: Target date: 02/15/2023  Patient will be independent with initial home exercise program for self-management of  symptoms. Baseline: Initial HEP provided at IE (02/01/23); Goal status: In-progress  LONG TERM GOALS: Target date: 04/26/2023  Patient will be independent with a long-term home exercise program for self-management of symptoms.  Baseline: Initial HEP provided at IE (02/01/23); currently participating well (03/19/2023);  Goal status: In-progress  2.  Patient will demonstrate improved FOTO to equal or greater than 68 by visit #10 to demonstrate improvement in overall condition and self-reported functional ability.  Baseline: 61 (02/01/23); 62 at visit #5 (02/21/2023);  Goal status: In-progress  3.  Patient will demonstrate R shoulder AROM equal or greater than L shoulder AROM to improve her ability to complete daily activities such as working, dressing, and playing pickleball.  Baseline: significant limitations - see objective (02/01/23); Goal status: In-progress  4.  Patient will demonstrate R shoulder MMT equal or greater than L shoulder MMT with no increase in concordant pain to improve her ability to complete valued activities such as dressing, reaching, pushing, working, and pickleball.  Baseline: significant limitations (02/01/23); Goal status: In-progress  5.  Patient will complete community, work and/or recreational activities with 75% less limitation due to current condition.  Baseline: difficulty working, using the computer mouse, reaching, dressing, pickleball, grooming, pushing, making the bed, housework, taking impact through the right UE, reaching (out, behind body, to back seat), sleeping (02/01/23); estimates 75%  improvement, but there are still things like reaching behind her back that is not back to where she was (03/19/2023);  Goal status: MET  PLAN:  PT FREQUENCY: 1-2x/week  PT DURATION: 12 weeks  PLANNED INTERVENTIONS: Therapeutic exercises, Therapeutic activity, Neuromuscular re-education, Patient/Family education, Self Care, Joint mobilization, Dry Needling,  Electrical stimulation, Spinal mobilization, Cryotherapy, Moist heat, Manual therapy, and Re-evaluation  PLAN FOR NEXT SESSION: update HEP as appropriate, progressive shoulder girdle/UE/postural/functional strengthening and ROM exercises as tolerated, manual therapy and dry needling as needed. Education. Consider nerve glides.    Cira Rue, PT, DPT 03/28/2023, 6:54 PM   [facility] [address] [place], [place] [address] P: [phone] I F: [phone]

## 2023-04-02 ENCOUNTER — Encounter: Payer: 59 | Admitting: Physical Therapy

## 2023-04-04 ENCOUNTER — Encounter: Payer: Self-pay | Admitting: Physical Therapy

## 2023-04-04 ENCOUNTER — Ambulatory Visit: Payer: 59 | Admitting: Physical Therapy

## 2023-04-04 DIAGNOSIS — M79621 Pain in right upper arm: Secondary | ICD-10-CM | POA: Diagnosis not present

## 2023-04-04 DIAGNOSIS — M25611 Stiffness of right shoulder, not elsewhere classified: Secondary | ICD-10-CM

## 2023-04-04 DIAGNOSIS — M6281 Muscle weakness (generalized): Secondary | ICD-10-CM

## 2023-04-04 DIAGNOSIS — G8929 Other chronic pain: Secondary | ICD-10-CM

## 2023-04-04 DIAGNOSIS — M25511 Pain in right shoulder: Secondary | ICD-10-CM | POA: Diagnosis not present

## 2023-04-04 DIAGNOSIS — J301 Allergic rhinitis due to pollen: Secondary | ICD-10-CM | POA: Diagnosis not present

## 2023-04-04 NOTE — Therapy (Signed)
OUTPATIENT PHYSICAL THERAPY TREATMENT   Patient Name: Joyce Brock MRN: [id number] DOB:11-22-1965, 57 y.o., female Today's Date: 04/04/2023  END OF SESSION:  PT End of Session - 04/04/23 1658     Visit Number 13    Number of Visits 17    Date for PT Re-Evaluation 04/26/23    Authorization Type [place] [employer] SAVE reporting period from 03/19/2023    Authorization Time Period *only 4 modalities per visit*  VL 25 PT/OT combined  medial review after 25th visit    Authorization - Visit Number 13    Authorization - Number of Visits 25    Progress Note Due on Visit 20    PT Start Time 1605    PT Stop Time 1645    PT Time Calculation (min) 40 min    Activity Tolerance Patient tolerated treatment well;No increased pain    Behavior During Therapy [facility] for tasks assessed/performed               Past Medical History:  Diagnosis Date   Allergy    Anxiety    GERD (gastroesophageal reflux disease)    Hyperlipidemia    Migraines    Past Surgical History:  Procedure Laterality Date   CHOLECYSTECTOMY     Patient Active Problem List   Diagnosis Date Noted   GERD (gastroesophageal reflux disease) 01/16/2020   Tachycardia    Pulmonary nodule    GAD (generalized anxiety disorder) 10/04/2018   Panic attack 10/04/2018   Atopic dermatitis 01/18/2017   Verruca vulgaris 11/15/2012   HYPERCHOLESTEROLEMIA 02/05/2009   Allergic rhinitis 10/30/2008   PSORIASIS 10/30/2008   URINARY INCONTINENCE, MIXED 10/30/2008    PCP: Excell Seltzer, MD  REFERRING PROVIDER: Excell Seltzer, MD  REFERRING DIAG: acute pain of right shoulder  THERAPY DIAG:  Chronic right shoulder pain  Pain in right upper arm  Stiffness of right shoulder, not elsewhere classified  Muscle weakness (generalized)  Rationale for Evaluation and Treatment: Rehabilitation  ONSET DATE: 6 months or more from PT evaluation  PERTINENT HISTORY: Patient is a 57 y.o. female who presents to outpatient physical  therapy with a referral for medical diagnosis acute pain of right shoulder. This patient's chief complaints consist of pain over the right posterior arm and lateral arm, difficulty using right arm, leading to the following functional deficits: difficulty working, using the computer mouse, reaching, dressing, pickleball, grooming, pushing, making the bed, housework, taking impact through the right UE, reaching (out, behind body, to back seat), sleeping. Relevant past medical history and comorbidities include has HYPERCHOLESTEROLEMIA; ALLERGIC RHINITIS; PSORIASIS; URINARY INCONTINENCE, MIXED; Palpitations; Rectal pain; Verruca vulgaris; Atopic dermatitis; GAD (generalized anxiety disorder); Panic attack; Vasovagal syncope; Pulmonary nodule; Tachycardia; GERD (gastroesophageal reflux disease); Tingling in extremities; Acute diarrhea; Acute pain of right shoulder; and Acute pain of left knee on their problem list. She  has a past medical history of Allergy, Anxiety, GERD (gastroesophageal reflux disease), Hyperlipidemia, and Migraines. She has a past surgical history that includes Cholecystectomy. Patient denies hx of cancer, stroke, seizures, lung problems, heart problems, diabetes, unexplained weight loss, unexplained changes in bowel or bladder problems, unexplained stumbling or dropping things, osteoporosis, and spinal surgery  SUBJECTIVE:  SUBJECTIVE STATEMENT: Patient states she is feeling well today. She states her shoulder has been feeling about the same. She states this week she has not been as consistent with her HEP.   PAIN:  NPRS: 0/10 currently, 0/10 pain generally over the last week when avoiding the movements and uses that bothers her, but it can get up to 3-4/10 when she does the exercises. Worst this week 3-4/10.    PATIENT GOALS: "to be able to do without the hurting and not damaging to the point I would need surgery or anything like that"  NEXT MD VISIT: no follow up for this specific problem  OBJECTIVE  TODAY'S TREATMENT:  Therapeutic exercise: to centralize symptoms and improve ROM, strength, muscular endurance, and activity tolerance required for successful completion of functional activities.  - Upper body ergometer level 7, 5 min total changing directions every 1 minute.  - seated B AROM shoulder assessment in abduction and flexion to help determine course of today's care (Manual therapy - see below) - hooklying L shoulder AROM abduction between bouts of joint mobilizations to assess effect.  - prone B shoulder horizontal abduction AROM  ("T"), 1x10 - prone B shoulder scaption AROM ("Y"), 1x10  Manual therapy: to reduce pain and tissue tension, improve range of motion, neuromodulation, in order to promote improved ability to complete functional activities. SUPINE/HOOKLYING - sidelying right scapular mobilization with movement into upward rotation,  - R GHJ mobilizations, caudal glide grade III- IV,  1x20-30 seconds and 1x30 seconds, 1x40 seconds  Pt required multimodal cuing for proper technique and to facilitate improved neuromuscular control, strength, range of motion, and functional ability resulting in improved performance and form.  PATIENT EDUCATION: Education details: tyypical prognosis and timeline for recovery to DC.  Education on HEP including handout. Person educated: Patient Education method: Explanation, Demonstration, Tactile cues, Verbal cues, and Handouts Education comprehension: verbalized understanding, returned demonstration, and needs further education  HOME EXERCISE PROGRAM: Access Code: [email or web address] URL: [email or web address] Date: 04/04/2023 Prepared by: Norton Blizzard  Exercises - Standing Shoulder Abduction Slides at Wall  - 1 x daily - 2 sets - 10  reps - 4 seconds hold - Scaption Wall Slide with Towel  - 1 x daily - 2 sets - 10 reps - 4 seconds hold - Push-Up on Counter  - 1 x daily - 2 sets - 15 reps - Standing Single Arm Shoulder Abduction with Resistance  - 1 x daily - 2 sets - 15 reps - Prone Horizontal Abduction with Palms Down  - 3-4 x weekly - 3 sets - 15 reps - 1 minute rest - Prone Lower Trapezius Strengthening on Swiss Ball  - 3-4 x weekly - 3 sets - 15 reps - 1 minute rest between sets - Shoulder External Rotation in Abduction with Anchored Resistance  - 3-4 x weekly - 2-3 sets - 20 reps - 45 seconds rest - Shoulder Internal Rotation in Abduction with Resistance  - 3-4 x weekly - 2-3 sets - 20 reps - 45 seconds rest  HOME EXERCISE PROGRAM [[email or web address]] View at "[email or web address]" using code: [email or web address] PROM Shoulder Internal Rotation (IR) with Towel - Up Back -  Repeat 10 Repetitions, Hold 4 Seconds, Complete 2 Sets, Perform 1 Times a Day  ASSESSMENT:  CLINICAL IMPRESSION: Patient's pain has not changed since her last session.  Her scapular mobility was WNL, indicating that her limitation is most likely from her [facility] joint and not scapulo-thoracic.  She responded well to the [facility] Caudal glides with increased  mobility and decreased pain in abduction, but she still had pain at end range PROM for shoulder ABD.  Patient tolerated exercise progressions in today's session, as she could perform shoulder ER and IR in a 90/90 position.  Plan for future sessions would be to teach pt. self-mobilization for inferior glide to increase her independence.  Pt. would benefit from skilled PT intervention to continue to improve her ROM and decrease pain.   OBJECTIVE IMPAIRMENTS: decreased activity tolerance, decreased coordination, decreased endurance, decreased knowledge of condition, difficulty walking, decreased ROM, decreased strength, hypomobility, impaired perceived functional ability, increased muscle spasms, impaired flexibility, impaired UE  functional use, improper body mechanics, and pain.   ACTIVITY LIMITATIONS: carrying, lifting, sleeping, bed mobility, bathing, dressing, reach over head, and caring for others  PARTICIPATION LIMITATIONS: meal prep, cleaning, laundry, community activity, occupation, yard work, and   difficulty working, using the Firefighter, reaching, dressing, pickleball, grooming, pushing, making the bed, housework, taking impact through the right UE, reaching (out, behind body, to back seat), sleeping  PERSONAL FACTORS: Age, Past/current experiences, Time since onset of injury/illness/exacerbation, and 3+ comorbidities:   HYPERCHOLESTEROLEMIA; ALLERGIC RHINITIS; PSORIASIS; URINARY INCONTINENCE, MIXED; Palpitations; Rectal pain; Verruca vulgaris; Atopic dermatitis; GAD (generalized anxiety disorder); Panic attack; Vasovagal syncope; Pulmonary nodule; Tachycardia; GERD (gastroesophageal reflux disease); Tingling in extremities; Acute diarrhea; Acute pain of right shoulder; and Acute pain of left knee on their problem list. She  has a past medical history of Allergy, Anxiety, GERD (gastroesophageal reflux disease), Hyperlipidemia, and Migraines are also affecting patient's functional outcome.   REHAB POTENTIAL: Good  CLINICAL DECISION MAKING: Evolving/moderate complexity  EVALUATION COMPLEXITY: Moderate   GOALS: Goals reviewed with patient? No  SHORT TERM GOALS: Target date: 02/15/2023  Patient will be independent with initial home exercise program for self-management of symptoms. Baseline: Initial HEP provided at IE (02/01/23); Goal status: In-progress  LONG TERM GOALS: Target date: 04/26/2023  Patient will be independent with a long-term home exercise program for self-management of symptoms.  Baseline: Initial HEP provided at IE (02/01/23); currently participating well (03/19/2023);  Goal status: In-progress  2.  Patient will demonstrate improved FOTO to equal or greater than 68 by visit #10 to  demonstrate improvement in overall condition and self-reported functional ability.  Baseline: 61 (02/01/23); 62 at visit #5 (02/21/2023);  Goal status: In-progress  3.  Patient will demonstrate R shoulder AROM equal or greater than L shoulder AROM to improve her ability to complete daily activities such as working, dressing, and playing pickleball.  Baseline: significant limitations - see objective (02/01/23); Goal status: In-progress  4.  Patient will demonstrate R shoulder MMT equal or greater than L shoulder MMT with no increase in concordant pain to improve her ability to complete valued activities such as dressing, reaching, pushing, working, and pickleball.  Baseline: significant limitations (02/01/23); Goal status: In-progress  5.  Patient will complete community, work and/or recreational activities with 75% less limitation due to current condition.  Baseline: difficulty working, using the computer mouse, reaching, dressing, pickleball, grooming, pushing, making the bed, housework, taking impact through the right UE, reaching (out, behind body, to back seat), sleeping (02/01/23); estimates 75% improvement, but there are still things like reaching behind her back that is not back to where she was (03/19/2023);  Goal status: MET  PLAN:  PT FREQUENCY: 1-2x/week  PT DURATION: 12 weeks  PLANNED INTERVENTIONS: Therapeutic exercises, Therapeutic activity, Neuromuscular re-education, Patient/Family education, Self Care, Joint mobilization, Dry Needling, Electrical stimulation, Spinal mobilization, Cryotherapy, Moist heat, Manual therapy, and  Re-evaluation  PLAN FOR NEXT SESSION: update HEP as appropriate, progressive shoulder girdle/UE/postural/functional strengthening and ROM exercises as tolerated, manual therapy and dry needling as needed. Education. Consider nerve glides.    Cira Rue, PT, DPT 04/04/2023, 7:49 PM   [facility] [address] [place], [place] [address] P: [phone] I F: [phone]

## 2023-04-04 NOTE — Progress Notes (Addendum)
Error

## 2023-04-05 ENCOUNTER — Ambulatory Visit: Payer: 59

## 2023-04-05 ENCOUNTER — Other Ambulatory Visit: Payer: Self-pay

## 2023-04-05 ENCOUNTER — Encounter: Payer: Self-pay | Admitting: Gastroenterology

## 2023-04-05 VITALS — Ht 66.0 in | Wt 165.0 lb

## 2023-04-05 DIAGNOSIS — Z860100 Personal history of colon polyps, unspecified: Secondary | ICD-10-CM

## 2023-04-05 MED ORDER — NA SULFATE-K SULFATE-MG SULF 17.5-3.13-1.6 GM/177ML PO SOLN
1.0000 | Freq: Once | ORAL | 0 refills | Status: AC
  Filled 2023-04-05: qty 354, 1d supply, fill #0

## 2023-04-05 NOTE — Progress Notes (Signed)
Pre visit completed via phone call; Patient verified name, DOB, and address; No egg or soy allergy known to patient;  No issues known to pt with past sedation with any surgeries or procedures; Patient denies ever being told they had issues or difficulty with intubation;  No FH of Malignant Hyperthermia; Pt is not on diet pills; Pt is not on home 02;  Pt is not on blood thinners;  Pt denies issues with constipation;  No A fib or A flutter; Have any cardiac testing pending--NO Insurance verified during PV appt--- [facility] Pt can ambulate without assistance;  Pt denies use of chewing tobacco Discussed diabetic/weight loss medication holds; Discussed NSAID holds; Checked BMI to be less than 50; Pt instructed to use Single[employer] or GoodRx for a price reduction on prep;  Patient's chart reviewed by Cathlyn Parsons CNRA prior to previsit and patient appropriate for the [facility];  Pre visit completed and red dot placed by patient's name on their procedure day (on provider's schedule);   Instructions sent to MyChart per her request;

## 2023-04-09 ENCOUNTER — Encounter: Payer: 59 | Admitting: Physical Therapy

## 2023-04-11 ENCOUNTER — Encounter: Payer: Self-pay | Admitting: Physical Therapy

## 2023-04-11 ENCOUNTER — Other Ambulatory Visit: Payer: Self-pay

## 2023-04-11 ENCOUNTER — Ambulatory Visit: Payer: 59 | Admitting: Physical Therapy

## 2023-04-11 DIAGNOSIS — G8929 Other chronic pain: Secondary | ICD-10-CM | POA: Diagnosis not present

## 2023-04-11 DIAGNOSIS — M25611 Stiffness of right shoulder, not elsewhere classified: Secondary | ICD-10-CM | POA: Diagnosis not present

## 2023-04-11 DIAGNOSIS — M6281 Muscle weakness (generalized): Secondary | ICD-10-CM | POA: Diagnosis not present

## 2023-04-11 DIAGNOSIS — M25511 Pain in right shoulder: Secondary | ICD-10-CM | POA: Diagnosis not present

## 2023-04-11 DIAGNOSIS — M79621 Pain in right upper arm: Secondary | ICD-10-CM

## 2023-04-11 DIAGNOSIS — J309 Allergic rhinitis, unspecified: Secondary | ICD-10-CM | POA: Diagnosis not present

## 2023-04-11 MED ORDER — EPINEPHRINE 0.3 MG/0.3ML IJ SOAJ
0.3000 mg | INTRAMUSCULAR | 10 refills | Status: AC
  Filled 2023-04-11: qty 2, 30d supply, fill #0

## 2023-04-11 MED ORDER — CETIRIZINE HCL 10 MG PO TABS
10.0000 mg | ORAL_TABLET | Freq: Every day | ORAL | 14 refills | Status: DC | PRN
  Filled 2023-04-11: qty 90, 90d supply, fill #0

## 2023-04-11 MED ORDER — FLUTICASONE PROPIONATE 50 MCG/ACT NA SUSP
2.0000 | Freq: Every day | NASAL | 11 refills | Status: AC
  Filled 2023-04-11: qty 16, 30d supply, fill #0
  Filled 2023-06-18: qty 16, 30d supply, fill #1
  Filled 2023-11-29: qty 16, 30d supply, fill #2
  Filled 2024-02-20: qty 16, 30d supply, fill #3

## 2023-04-11 NOTE — Therapy (Addendum)
OUTPATIENT PHYSICAL THERAPY TREATMENT   Patient Name: Joyce Brock MRN: [id number] DOB:1965/10/01, 57 y.o., female Today's Date: 04/11/2023  END OF SESSION:  PT End of Session - 04/11/23 2045     Visit Number 15    Number of Visits 17    Date for PT Re-Evaluation 04/26/23    Authorization Type [place] [employer] S[employer] reporting period from 03/19/2023    Authorization Time Period *only 4 modalities per visit*  VL 25 PT/OT combined  medial review after 25th visit    Authorization - Visit Number 15    Authorization - Number of Visits 25    Progress Note Due on Visit 20    PT Start Time 1605    PT Stop Time 1645    PT Time Calculation (min) 40 min    Activity Tolerance Patient tolerated treatment well;No increased pain    Behavior During Therapy WFL for tasks assessed/performed                Past Medical History:  Diagnosis Date   Allergy    Anxiety    GERD (gastroesophageal reflux disease)    with certain foods/take OTC PRN meds   Hyperlipidemia    Migraines    Past Surgical History:  Procedure Laterality Date   CHOLECYSTECTOMY     COLONOSCOPY  2019   MS-MAC-suprep (good after lavage)-TA x 3/hems/tics   Patient Active Problem List   Diagnosis Date Noted   GERD (gastroesophageal reflux disease) 01/16/2020   Tachycardia    Pulmonary nodule    GAD (generalized anxiety disorder) 10/04/2018   Panic attack 10/04/2018   Atopic dermatitis 01/18/2017   Verruca vulgaris 11/15/2012   HYPERCHOLESTEROLEMIA 02/05/2009   Allergic rhinitis 10/30/2008   PSORIASIS 10/30/2008   URINARY INCONTINENCE, MIXED 10/30/2008    PCP: Excell Seltzer, MD  REFERRING PROVIDER: Excell Seltzer, MD  REFERRING DIAG: acute pain of right shoulder  THERAPY DIAG:  Chronic right shoulder pain  Pain in right upper arm  Stiffness of right shoulder, not elsewhere classified  Muscle weakness (generalized)  Rationale for Evaluation and Treatment: Rehabilitation  ONSET DATE: 6 months  or more from PT evaluation  PERTINENT HISTORY: Patient is a 57 y.o. female who presents to outpatient physical therapy with a referral for medical diagnosis acute pain of right shoulder. This patient's chief complaints consist of pain over the right posterior arm and lateral arm, difficulty using right arm, leading to the following functional deficits: difficulty working, using the computer mouse, reaching, dressing, pickleball, grooming, pushing, making the bed, housework, taking impact through the right UE, reaching (out, behind body, to back seat), sleeping. Relevant past medical history and comorbidities include has HYPERCHOLESTEROLEMIA; ALLERGIC RHINITIS; PSORIASIS; URINARY INCONTINENCE, MIXED; Palpitations; Rectal pain; Verruca vulgaris; Atopic dermatitis; GAD (generalized anxiety disorder); Panic attack; Vasovagal syncope; Pulmonary nodule; Tachycardia; GERD (gastroesophageal reflux disease); Tingling in extremities; Acute diarrhea; Acute pain of right shoulder; and Acute pain of left knee on their problem list. She  has a past medical history of Allergy, Anxiety, GERD (gastroesophageal reflux disease), Hyperlipidemia, and Migraines. She has a past surgical history that includes Cholecystectomy. Patient denies hx of cancer, stroke, seizures, lung problems, heart problems, diabetes, unexplained weight loss, unexplained changes in bowel or bladder problems, unexplained stumbling or dropping things, osteoporosis, and spinal surgery  SUBJECTIVE:  SUBJECTIVE STATEMENT: Slight increase in soreness after last session.  No pain today. HEP exercises every other day  PAIN:  NPRS: 0/10 currently  PATIENT GOALS: "to be able to do without the hurting and not damaging to the point I would need surgery or anything like that"  NEXT  MD VISIT: no follow up for this specific problem  OBJECTIVE  TODAY'S TREATMENT:  Therapeutic exercise: to centralize symptoms and improve ROM, strength, muscular endurance, and activity tolerance required for successful completion of functional activities.  - Upper body ergometer level 7, 5 min total changing directions every 1 minute.  - standing B AROM shoulder assessment before/after manual interventions to help assess success fo care (Manual therapy - see below) - standing B shoulder scaption with 3#DB, 3x10 - Education on HEP (use weights with scaption)  Manual therapy: to reduce pain and tissue tension, improve range of motion, neuromodulation, in order to promote improved ability to complete functional activities. SUPINE/HOOKLYING - STM with sustained pressure (with palpable softening and reported decreased pain with continued pressure) to right subscapularis muscle  - R GHJ mobilizations grade II-IV: caudal glide grade at 70-90 degrees scaption,  3x30-40, AP glide at approx 60 degrees abduction 2x30 seconds in internally rotated position and 1x30 seconds in neutral rotation,  PRONE  - R GHJ mobilizations grade II-IV: PA glide at approx 60 degrees abduction 1x30 seconds in externally rotated position and 2x30 seconds in neutral rotation,  (Improved flexion/scaption/abduction following manual therapy)  Pt required multimodal cuing for proper technique and to facilitate improved neuromuscular control, strength, range of motion, and functional ability resulting in improved performance and form.  PATIENT EDUCATION: Education details: updates in HEP, increased visit frequency recommendation and further visits.  Education on HEP including handout. Person educated: Patient Education method: Explanation, Demonstration, Tactile cues, Verbal cues, and Handouts Education comprehension: verbalized understanding, returned demonstration, and needs further education  HOME EXERCISE  PROGRAM: Access Code: [email or web address] URL: [email or web address] Date: 04/04/2023 Prepared by: Norton Blizzard  Exercises - Standing Shoulder Abduction Slides at Wall  - 1 x daily - 2 sets - 10 reps - 4 seconds hold - Scaption Wall Slide with Towel  - 1 x daily - 2 sets - 10 reps - 4 seconds hold - Push-Up on Counter  - 1 x daily - 2 sets - 15 reps - Standing Single Arm Shoulder Abduction with Resistance  - 1 x daily - 2 sets - 15 reps - Prone Horizontal Abduction with Palms Down  - 3-4 x weekly - 3 sets - 15 reps - 1 minute rest - Prone Lower Trapezius Strengthening on Swiss Ball  - 3-4 x weekly - 3 sets - 15 reps - 1 minute rest between sets - Shoulder External Rotation in Abduction with Anchored Resistance  - 3-4 x weekly - 2-3 sets - 20 reps - 45 seconds rest - Shoulder Internal Rotation in Abduction with Resistance  - 3-4 x weekly - 2-3 sets - 20 reps - 45 seconds rest  HOME EXERCISE PROGRAM [[email or web address]] View at "[email or web address]" using code: [email or web address] PROM Shoulder Internal Rotation (IR) with Towel - Up Back -  Repeat 10 Repetitions, Hold 4 Seconds, Complete 2 Sets, Perform 1 Times a Day  ASSESSMENT:  CLINICAL IMPRESSION: Patient continues to report no change in pain or motion upon arrival but was able to significantly improve her overhead motion and comfort per observation and report following manual therapy interventions. Patient appeared to have trigger points in her right subscapularis that improved with sustained  pressure that improved her motion. She continues to demonstrate capsular restriction in the GHJ, but with improvement following manual therapy including joint mobilizations. Patient would benefit from more frequent manual interventions and may benefit from mobilizations with movement. Patient would benefit from continued management of limiting condition by skilled physical therapist to address remaining impairments and functional limitations to work towards stated goals  and return to PLOF or maximal functional independence.    OBJECTIVE IMPAIRMENTS: decreased activity tolerance, decreased coordination, decreased endurance, decreased knowledge of condition, difficulty walking, decreased ROM, decreased strength, hypomobility, impaired perceived functional ability, increased muscle spasms, impaired flexibility, impaired UE functional use, improper body mechanics, and pain.   ACTIVITY LIMITATIONS: carrying, lifting, sleeping, bed mobility, bathing, dressing, reach over head, and caring for others  PARTICIPATION LIMITATIONS: meal prep, cleaning, laundry, community activity, occupation, yard work, and   difficulty working, using the Firefighter, reaching, dressing, pickleball, grooming, pushing, making the bed, housework, taking impact through the right UE, reaching (out, behind body, to back seat), sleeping  PERSONAL FACTORS: Age, Past/current experiences, Time since onset of injury/illness/exacerbation, and 3+ comorbidities:   HYPERCHOLESTEROLEMIA; ALLERGIC RHINITIS; PSORIASIS; URINARY INCONTINENCE, MIXED; Palpitations; Rectal pain; Verruca vulgaris; Atopic dermatitis; GAD (generalized anxiety disorder); Panic attack; Vasovagal syncope; Pulmonary nodule; Tachycardia; GERD (gastroesophageal reflux disease); Tingling in extremities; Acute diarrhea; Acute pain of right shoulder; and Acute pain of left knee on their problem list. She  has a past medical history of Allergy, Anxiety, GERD (gastroesophageal reflux disease), Hyperlipidemia, and Migraines are also affecting patient's functional outcome.   REHAB POTENTIAL: Good  CLINICAL DECISION MAKING: Evolving/moderate complexity  EVALUATION COMPLEXITY: Moderate   GOALS: Goals reviewed with patient? No  SHORT TERM GOALS: Target date: 02/15/2023  Patient will be independent with initial home exercise program for self-management of symptoms. Baseline: Initial HEP provided at IE (02/01/23); Goal status:  In-progress  LONG TERM GOALS: Target date: 04/26/2023  Patient will be independent with a long-term home exercise program for self-management of symptoms.  Baseline: Initial HEP provided at IE (02/01/23); currently participating well (03/19/2023);  Goal status: In-progress  2.  Patient will demonstrate improved FOTO to equal or greater than 68 by visit #10 to demonstrate improvement in overall condition and self-reported functional ability.  Baseline: 61 (02/01/23); 62 at visit #5 (02/21/2023);  Goal status: In-progress  3.  Patient will demonstrate R shoulder AROM equal or greater than L shoulder AROM to improve her ability to complete daily activities such as working, dressing, and playing pickleball.  Baseline: significant limitations - see objective (02/01/23); Goal status: In-progress  4.  Patient will demonstrate R shoulder MMT equal or greater than L shoulder MMT with no increase in concordant pain to improve her ability to complete valued activities such as dressing, reaching, pushing, working, and pickleball.  Baseline: significant limitations (02/01/23); Goal status: In-progress  5.  Patient will complete community, work and/or recreational activities with 75% less limitation due to current condition.  Baseline: difficulty working, using the computer mouse, reaching, dressing, pickleball, grooming, pushing, making the bed, housework, taking impact through the right UE, reaching (out, behind body, to back seat), sleeping (02/01/23); estimates 75% improvement, but there are still things like reaching behind her back that is not back to where she was (03/19/2023);  Goal status: MET  PLAN:  PT FREQUENCY: 1-2x/week  PT DURATION: 12 weeks  PLANNED INTERVENTIONS: Therapeutic exercises, Therapeutic activity, Neuromuscular re-education, Patient/Family education, Self Care, Joint mobilization, Dry Needling, Electrical stimulation, Spinal mobilization, Cryotherapy, Moist heat, Manual  therapy, and  Re-evaluation  PLAN FOR NEXT SESSION: update HEP as appropriate, progressive shoulder girdle/UE/postural/functional strengthening and ROM exercises as tolerated, manual therapy and dry needling as needed. Education.   Consider the following:  Continue with joint mobilizations and addressing subscapularis trigger points.  Mobilization with movement for overhead movement with potential to add to HEP.   Joyce Brock, Student-PT   Luretha Murphy. Ilsa Iha, PT, DPT 04/11/23, 8:58 PM  [facility] [address] [place], [place] [address] P: [phone] Cherylann Parr: [phone]   [facility] [address] [place], [place] [address] P: [phone] I F: [phone]

## 2023-04-12 DIAGNOSIS — J301 Allergic rhinitis due to pollen: Secondary | ICD-10-CM | POA: Diagnosis not present

## 2023-04-16 ENCOUNTER — Encounter: Payer: 59 | Admitting: Physical Therapy

## 2023-04-18 ENCOUNTER — Ambulatory Visit: Payer: 59 | Attending: Family Medicine | Admitting: Physical Therapy

## 2023-04-18 ENCOUNTER — Encounter: Payer: Self-pay | Admitting: Physical Therapy

## 2023-04-18 DIAGNOSIS — M79621 Pain in right upper arm: Secondary | ICD-10-CM | POA: Diagnosis not present

## 2023-04-18 DIAGNOSIS — G8929 Other chronic pain: Secondary | ICD-10-CM | POA: Insufficient documentation

## 2023-04-18 DIAGNOSIS — J301 Allergic rhinitis due to pollen: Secondary | ICD-10-CM | POA: Diagnosis not present

## 2023-04-18 DIAGNOSIS — M25511 Pain in right shoulder: Secondary | ICD-10-CM | POA: Diagnosis not present

## 2023-04-18 DIAGNOSIS — M6281 Muscle weakness (generalized): Secondary | ICD-10-CM | POA: Diagnosis not present

## 2023-04-18 DIAGNOSIS — M25611 Stiffness of right shoulder, not elsewhere classified: Secondary | ICD-10-CM | POA: Diagnosis not present

## 2023-04-18 NOTE — Therapy (Addendum)
OUTPATIENT PHYSICAL THERAPY TREATMENT   Patient Name: Joyce Brock MRN: [id number] DOB:1965/10/16, 57 y.o., female Today's Date: 04/18/2023  END OF SESSION:  PT End of Session - 04/18/23 1755     Visit Number 16    Number of Visits 17    Date for PT Re-Evaluation 04/26/23    Authorization Type [place] [employer] SAVE reporting period from 03/19/2023    Authorization Time Period *only 4 modalities per visit*  VL 25 PT/OT combined  medial review after 25th visit    Authorization - Visit Number 16    Authorization - Number of Visits 25    Progress Note Due on Visit 20    PT Start Time 1650    PT Stop Time 1730    PT Time Calculation (min) 40 min    Activity Tolerance Patient tolerated treatment well;No increased pain    Behavior During Therapy WFL for tasks assessed/performed                 Past Medical History:  Diagnosis Date   Allergy    Anxiety    GERD (gastroesophageal reflux disease)    with certain foods/take OTC PRN meds   Hyperlipidemia    Migraines    Past Surgical History:  Procedure Laterality Date   CHOLECYSTECTOMY     COLONOSCOPY  2019   MS-MAC-suprep (good after lavage)-TA x 3/hems/tics   Patient Active Problem List   Diagnosis Date Noted   GERD (gastroesophageal reflux disease) 01/16/2020   Tachycardia    Pulmonary nodule    GAD (generalized anxiety disorder) 10/04/2018   Panic attack 10/04/2018   Atopic dermatitis 01/18/2017   Verruca vulgaris 11/15/2012   HYPERCHOLESTEROLEMIA 02/05/2009   Allergic rhinitis 10/30/2008   PSORIASIS 10/30/2008   URINARY INCONTINENCE, MIXED 10/30/2008    PCP: Excell Seltzer, MD  REFERRING PROVIDER: Excell Seltzer, MD  REFERRING DIAG: acute pain of right shoulder  THERAPY DIAG:  Chronic right shoulder pain  Pain in right upper arm  Stiffness of right shoulder, not elsewhere classified  Muscle weakness (generalized)  Rationale for Evaluation and Treatment: Rehabilitation  ONSET DATE: 6  months or more from PT evaluation  PERTINENT HISTORY: Patient is a 57 y.o. female who presents to outpatient physical therapy with a referral for medical diagnosis acute pain of right shoulder. This patient's chief complaints consist of pain over the right posterior arm and lateral arm, difficulty using right arm, leading to the following functional deficits: difficulty working, using the computer mouse, reaching, dressing, pickleball, grooming, pushing, making the bed, housework, taking impact through the right UE, reaching (out, behind body, to back seat), sleeping. Relevant past medical history and comorbidities include has HYPERCHOLESTEROLEMIA; ALLERGIC RHINITIS; PSORIASIS; URINARY INCONTINENCE, MIXED; Palpitations; Rectal pain; Verruca vulgaris; Atopic dermatitis; GAD (generalized anxiety disorder); Panic attack; Vasovagal syncope; Pulmonary nodule; Tachycardia; GERD (gastroesophageal reflux disease); Tingling in extremities; Acute diarrhea; Acute pain of right shoulder; and Acute pain of left knee on their problem list. She  has a past medical history of Allergy, Anxiety, GERD (gastroesophageal reflux disease), Hyperlipidemia, and Migraines. She has a past surgical history that includes Cholecystectomy. Patient denies hx of cancer, stroke, seizures, lung problems, heart problems, diabetes, unexplained weight loss, unexplained changes in bowel or bladder problems, unexplained stumbling or dropping things, osteoporosis, and spinal surgery  SUBJECTIVE:  SUBJECTIVE STATEMENT: Been feeling achy over the past week, like a toothache.  No soreness directly after session.  Pain was just annoying but not getting in the way. Lateral upper arm  PAIN:  NPRS: 0/10 currently  PATIENT GOALS: "to be able to do without the hurting and not  damaging to the point I would need surgery or anything like that"  NEXT MD VISIT: no follow up for this specific problem  OBJECTIVE  TODAY'S TREATMENT:  Therapeutic exercise: to centralize symptoms and improve ROM, strength, muscular endurance, and activity tolerance required for successful completion of functional activities.  - Upper body ergometer level 7, 5 min total changing directions every 1 minute.  - standing R shoulder AROM overhead assessment before/after interventions throughout session to help assess success fo care (Manual therapy - see below) - standing right shoulder mobilization with movement using strong band anchored under foot with short arc scaption from 90 degrees to max overhead, 4x10 with assistance from PT for band position (tiring).  - step standing AAROM R shoulder flexion on wall with caudal glide provided by strong band over head of humerous. Band ancored under R foot (back) and left hand pulling band loop towards floor. 2x10 (end range discomfort at lateral shoulder).   Manual therapy: to reduce pain and tissue tension, improve range of motion, neuromodulation, in order to promote improved ability to complete functional activities. SUPINE/HOOKLYING - R GHJ mobilizations grade II-IV: caudal glide grade at 90-130 degrees scaption,  3-4x30-40, AP glide at approx 60 degrees abduction 2-3x30 seconds in internally rotated position.  - R GHJ posterior glide with arm across body, with force longitudinally through humerus to stretch posterior capsule, grade III-IV, 1x10 seconds (discontinued due to discomfort).  - STM with sustained pressure (with palpable softening and reported decreased pain with continued pressure) to right subscapularis muscle  - PROM flexion/scaption between other techniques to assess response (pain continues at posterior shoulder at end range flexion, but slightly improved ROM).  - seated right shoulder AROM scaption mobilization with manual caudal  glide with movement, 1x5  Pt required multimodal cuing for proper technique and to facilitate improved neuromuscular control, strength, range of motion, and functional ability resulting in improved performance and form.  PATIENT EDUCATION: Education details: updates in HEP, increased visit frequency recommendation and further visits.  Education on HEP including handout. Person educated: Patient Education method: Explanation, Demonstration, Tactile cues, Verbal cues, and Handouts Education comprehension: verbalized understanding, returned demonstration, and needs further education  HOME EXERCISE PROGRAM: Access Code: [email or web address] URL: [email or web address] Date: 04/04/2023 Prepared by: Norton Blizzard  Exercises - Standing Shoulder Abduction Slides at Wall  - 1 x daily - 2 sets - 10 reps - 4 seconds hold - Scaption Wall Slide with Towel  - 1 x daily - 2 sets - 10 reps - 4 seconds hold - Push-Up on Counter  - 1 x daily - 2 sets - 15 reps - Standing Single Arm Shoulder Abduction with Resistance  - 1 x daily - 2 sets - 15 reps - Prone Horizontal Abduction with Palms Down  - 3-4 x weekly - 3 sets - 15 reps - 1 minute rest - Prone Lower Trapezius Strengthening on Swiss Ball  - 3-4 x weekly - 3 sets - 15 reps - 1 minute rest between sets - Shoulder External Rotation in Abduction with Anchored Resistance  - 3-4 x weekly - 2-3 sets - 20 reps - 45 seconds rest - Shoulder Internal Rotation in Abduction with Resistance  -  3-4 x weekly - 2-3 sets - 20 reps - 45 seconds rest  HOME EXERCISE PROGRAM [[email or web address]] View at "[email or web address]" using code: [email or web address] PROM Shoulder Internal Rotation (IR) with Towel - Up Back -  Repeat 10 Repetitions, Hold 4 Seconds, Complete 2 Sets, Perform 1 Times a Day  ASSESSMENT:  CLINICAL IMPRESSION: Patient arrives reporting some constant aching discomfort that resulted in poor HEP participation, but no current pain. Today's session focused on improving GHJ  accessory motion to improve her ability to restore overhead motion. Patient had improvement in overhead ROM with less pain by end of session. Plan to update HEP at next session to include more intensive overhead stretching AAROM with band mobilization if possible to get more frequent effective stretch for improved motion over time. Patient would benefit from continued management of limiting condition by skilled physical therapist to address remaining impairments and functional limitations to work towards stated goals and return to PLOF or maximal functional independence.    OBJECTIVE IMPAIRMENTS: decreased activity tolerance, decreased coordination, decreased endurance, decreased knowledge of condition, difficulty walking, decreased ROM, decreased strength, hypomobility, impaired perceived functional ability, increased muscle spasms, impaired flexibility, impaired UE functional use, improper body mechanics, and pain.   ACTIVITY LIMITATIONS: carrying, lifting, sleeping, bed mobility, bathing, dressing, reach over head, and caring for others  PARTICIPATION LIMITATIONS: meal prep, cleaning, laundry, community activity, occupation, yard work, and   difficulty working, using the Firefighter, reaching, dressing, pickleball, grooming, pushing, making the bed, housework, taking impact through the right UE, reaching (out, behind body, to back seat), sleeping  PERSONAL FACTORS: Age, Past/current experiences, Time since onset of injury/illness/exacerbation, and 3+ comorbidities:   HYPERCHOLESTEROLEMIA; ALLERGIC RHINITIS; PSORIASIS; URINARY INCONTINENCE, MIXED; Palpitations; Rectal pain; Verruca vulgaris; Atopic dermatitis; GAD (generalized anxiety disorder); Panic attack; Vasovagal syncope; Pulmonary nodule; Tachycardia; GERD (gastroesophageal reflux disease); Tingling in extremities; Acute diarrhea; Acute pain of right shoulder; and Acute pain of left knee on their problem list. She  has a past medical history of  Allergy, Anxiety, GERD (gastroesophageal reflux disease), Hyperlipidemia, and Migraines are also affecting patient's functional outcome.   REHAB POTENTIAL: Good  CLINICAL DECISION MAKING: Evolving/moderate complexity  EVALUATION COMPLEXITY: Moderate   GOALS: Goals reviewed with patient? No  SHORT TERM GOALS: Target date: 02/15/2023  Patient will be independent with initial home exercise program for self-management of symptoms. Baseline: Initial HEP provided at IE (02/01/23); Goal status: In-progress  LONG TERM GOALS: Target date: 04/26/2023  Patient will be independent with a long-term home exercise program for self-management of symptoms.  Baseline: Initial HEP provided at IE (02/01/23); currently participating well (03/19/2023);  Goal status: In-progress  2.  Patient will demonstrate improved FOTO to equal or greater than 68 by visit #10 to demonstrate improvement in overall condition and self-reported functional ability.  Baseline: 61 (02/01/23); 62 at visit #5 (02/21/2023);  Goal status: In-progress  3.  Patient will demonstrate R shoulder AROM equal or greater than L shoulder AROM to improve her ability to complete daily activities such as working, dressing, and playing pickleball.  Baseline: significant limitations - see objective (02/01/23); Goal status: In-progress  4.  Patient will demonstrate R shoulder MMT equal or greater than L shoulder MMT with no increase in concordant pain to improve her ability to complete valued activities such as dressing, reaching, pushing, working, and pickleball.  Baseline: significant limitations (02/01/23); Goal status: In-progress  5.  Patient will complete community, work and/or recreational activities with 75% less limitation due to current condition.  Baseline: difficulty working,  using the computer mouse, reaching, dressing, pickleball, grooming, pushing, making the bed, housework, taking impact through the right UE, reaching (out, behind  body, to back seat), sleeping (02/01/23); estimates 75% improvement, but there are still things like reaching behind her back that is not back to where she was (03/19/2023);  Goal status: MET  PLAN:  PT FREQUENCY: 1-2x/week  PT DURATION: 12 weeks  PLANNED INTERVENTIONS: Therapeutic exercises, Therapeutic activity, Neuromuscular re-education, Patient/Family education, Self Care, Joint mobilization, Dry Needling, Electrical stimulation, Spinal mobilization, Cryotherapy, Moist heat, Manual therapy, and Re-evaluation  PLAN FOR NEXT SESSION: update HEP as appropriate, progressive shoulder girdle/UE/postural/functional strengthening and ROM exercises as tolerated, manual therapy and dry needling as needed. Education.   Consider the following:  Continue with joint mobilizations and addressing subscapularis trigger points.  Mobilization with movement for overhead movement with potential to add to HEP.   Aissatou Fronczak, Student-PT   Luretha Murphy. Ilsa Iha, PT, DPT 04/18/23, 6:13 PM  [facility] [address] [place], [place] [address] P: [phone] I F: [phone]

## 2023-04-19 ENCOUNTER — Ambulatory Visit: Payer: 59 | Admitting: Clinical

## 2023-04-19 DIAGNOSIS — F411 Generalized anxiety disorder: Secondary | ICD-10-CM

## 2023-04-19 NOTE — Progress Notes (Signed)
[facility] Counselor/Therapist Progress Note  Patient ID: Joyce Brock, MRN: [id number],    Date: 04/19/2023  Time Spent: 10:37am - 11:33am : 56 minutes   Treatment Type: Individual Therapy  Reported Symptoms: none reported  Mental Status Exam: Appearance:  Neat and Well Groomed     Behavior: Appropriate  Motor: Normal  Speech/Language:  Clear and Coherent  Affect: Appropriate  Mood: normal  Thought process: normal  Thought content:   WNL  Sensory/Perceptual disturbances:   WNL  Orientation: oriented to person, place, and situation  Attention: Good  Concentration: Good  Memory: WNL  Fund of knowledge:  Good  Insight:   Good  Judgment:  Good  Impulse Control: Good   Risk Assessment: Danger to Self:  No Patient denied current suicidal ideation  Self-injurious Behavior: No Danger to Others: No Patient denied current homicidal ideation Duty to Warn:no Physical Aggression / Violence:No  Access to Firearms a concern: No  Gang Involvement:No   Subjective: Patient stated, "good" in response to events since last session. Patient reported her father was recently diagnosed with squamous cell carcinoma on his leg and has a consultation with a surgeon on Monday. Patient stated, "that was kind of emotional when he found out" in reference to father's diagnosis. Patient stated, "that's kind of been a thought I've had to rationalize through" in regard to thoughts associated with father's diagnosis. Patient reported she utilized a deep breathing exercise in response to recent feelings of anxiety. Patient reported she realized she has not utilized imagery/visualization exercises recently and resumed practicing imagery/visualization exercises previously discussed in therapy. Patient stated, "I really need to add it to my calendar and be intentional" in reference to imagery/visualization exercises. Patient stated, "I know the things to do but its just being intentional about  doing it for myself" in reference to self care. Patient stated, "that would be helpful to do" in response to scheduling time for self care. Patient reported during her granddaughter's dedication there were several instances where patient felt she needed to walk away from the situation and patient reported her feelings were hurt in response to the situation. Patient reported she was hurt by her daughter in law's response when patient's parents kissed patient's granddaughter during the event. Patient reported she felt her daughter in law left patient out of plans for lunch after the event and reported she felt hurt in response. Patient reported she later discussed her feelings with her daughter in law and stated, "I felt good" in response to conversation  with daughter in law. Patient stated, "I was glad I was able to get what I wanted to say out" in response to conversation with daughter in law and stated,"I didn't have any anxiety about it". Patient stated,  "good" in response to patient's mood since last session.   Interventions: Cognitive Behavioral Therapy and Interpersonal. Clinician conducted session in person at clinician's office at [facility]. Reviewed events since last session. Discussed recent stressors and patient's response to stressors. Reviewed patient's implementation of deep breathing in response to recent feelings of anxiety. Reviewed the use of visualization/imagery. Provided psycho education related to the importance of self care. Discussed strategies to increase self care, such as, patient scheduling time for self care. Assisted patient in exploring and identifying thoughts/feelings triggered by daughter in law's behaviors at granddaughter's dedication event. Discussed patient's response to daughter in law's behaviors and patient's thoughts/feelings in response to conversation with daughter in law. Assessed patient's mood. Clinician requested patient continue thought  record for  homework and utilize socratic questions to challenge thoughts identified and practice deep breathing, visualization/imagery, mindfulness.    Collaboration of Care: Other not required at this time   Diagnosis:  Generalized anxiety disorder     Plan: Patient is to utilize [employer] Therapy, thought re-framing, relaxation techniques, mindfulness and coping strategies to decrease symptoms associated with Generalized Anxiety Disorder. Frequency: monthly  Modality: individual      Long-term goal:   Reduce overall level, frequency, and intensity of the feelings of anxiety and panic as evidenced by decrease in panic attacks, anxiety, negative thoughts, feeling defensive, difficulty focusing, feeling on edge, lack of motivation, and feeling unproductive from 3 to 5 days/week to 0 to 1 days/week per patient report for at least 3 consecutive months. Target Date: 09/07/23  Progress: progressing    Short-term goal:  Reduce overall level, frequency, and intensity of feeling overwhelmed, frustration, and irritability in response to the occurrence of unplanned events  Target Date: 03/10/23  Progress: patient reported she feels this goal has been met    Identify triggers for feeling overwhelmed, frustrated, and irritable when unplanned events occur and develop/implement strategies to process thoughts/feelings prior to responding.  Target Date: 03/10/23  Progress: patient reported she feels this goal has been met    Develop effective communication strategies for patient to utilize when expressing her thoughts and feelings to others in a healthy, controlled and assertive way  Target Date: 09/07/23  Progress: progressing    Verbally express an understanding of the relationship between feelings of anxiety and the impact on thinking patterns and behaviors.  Target Date: 03/10/23  Progress: patient reported she feels this goal has been met    Identify, challenge, and replace negative thought  patterns that contribute to feelings of  anxiety with positive thoughts and beliefs per patient's report  Target Date: 03/10/23  Progress: patient reported she feels this goal has been met    Doree Barthel, LCSW

## 2023-04-19 NOTE — Progress Notes (Signed)
                Dezi Schaner, LCSW 

## 2023-04-24 ENCOUNTER — Ambulatory Visit: Payer: 59 | Admitting: Gastroenterology

## 2023-04-24 ENCOUNTER — Encounter: Payer: Self-pay | Admitting: Gastroenterology

## 2023-04-24 VITALS — BP 116/74 | HR 100 | Temp 98.1°F | Resp 29 | Ht 66.0 in | Wt 165.0 lb

## 2023-04-24 DIAGNOSIS — Z860101 Personal history of adenomatous and serrated colon polyps: Secondary | ICD-10-CM

## 2023-04-24 DIAGNOSIS — D122 Benign neoplasm of ascending colon: Secondary | ICD-10-CM | POA: Diagnosis not present

## 2023-04-24 DIAGNOSIS — D120 Benign neoplasm of cecum: Secondary | ICD-10-CM

## 2023-04-24 DIAGNOSIS — Z09 Encounter for follow-up examination after completed treatment for conditions other than malignant neoplasm: Secondary | ICD-10-CM | POA: Diagnosis not present

## 2023-04-24 DIAGNOSIS — J301 Allergic rhinitis due to pollen: Secondary | ICD-10-CM | POA: Diagnosis not present

## 2023-04-24 DIAGNOSIS — Z1211 Encounter for screening for malignant neoplasm of colon: Secondary | ICD-10-CM | POA: Diagnosis not present

## 2023-04-24 DIAGNOSIS — E785 Hyperlipidemia, unspecified: Secondary | ICD-10-CM | POA: Diagnosis not present

## 2023-04-24 DIAGNOSIS — F419 Anxiety disorder, unspecified: Secondary | ICD-10-CM | POA: Diagnosis not present

## 2023-04-24 MED ORDER — SODIUM CHLORIDE 0.9 % IV SOLN: 500.0000 mL | INTRAVENOUS | Status: DC

## 2023-04-24 NOTE — Patient Instructions (Addendum)
-   Resume previous diet adding high fiber.  - Resume previous diet - Continue present medications - Await pathology results  - Handouts/information given for polyps, diverticulosis and hemorrhoids   YOU HAD AN ENDOSCOPIC PROCEDURE TODAY AT THE [facility]:   Refer to the procedure report that was given to you for any specific questions about what was found during the examination.  If the procedure report does not answer your questions, please call your gastroenterologist to clarify.  If you requested that your care partner not be given the details of your procedure findings, then the procedure report has been included in a sealed envelope for you to review at your convenience later.  YOU SHOULD EXPECT: Some feelings of bloating in the abdomen. Passage of more gas than usual.  Walking can help get rid of the air that was put into your GI tract during the procedure and reduce the bloating. If you had a lower endoscopy (such as a colonoscopy or flexible sigmoidoscopy) you may notice spotting of blood in your stool or on the toilet paper. If you underwent a bowel prep for your procedure, you may not have a normal bowel movement for a few days.  Please Note:  You might notice some irritation and congestion in your nose or some drainage.  This is from the oxygen used during your procedure.  There is no need for concern and it should clear up in a day or so.  SYMPTOMS TO REPORT IMMEDIATELY:  Following lower endoscopy (colonoscopy or flexible sigmoidoscopy):  Excessive amounts of blood in the stool  Significant tenderness or worsening of abdominal pains  Swelling of the abdomen that is new, acute  Fever of 100F or higher  For urgent or emergent issues, a gastroenterologist can be reached at any hour by calling [phone]. Do not use MyChart messaging for urgent concerns.    DIET:  We do recommend a small meal at first, but then you may proceed to your regular diet.  Drink plenty of  fluids but you should avoid alcoholic beverages for 24 hours.  ACTIVITY:  You should plan to take it easy for the rest of today and you should NOT DRIVE or use heavy machinery until tomorrow (because of the sedation medicines used during the test).    FOLLOW UP: Our staff will call the number listed on your records the next business day following your procedure.  We will call around 7:15- 8:00 am to check on you and address any questions or concerns that you may have regarding the information given to you following your procedure. If we do not reach you, we will leave a message.     If any biopsies were taken you will be contacted by phone or by letter within the next 1-3 weeks.  Please call us at [phone] if you have not heard about the biopsies in 3 weeks.    SIGNATURES/CONFIDENTIALITY: You and/or your care partner have signed paperwork which will be entered into your electronic medical record.  These signatures attest to the fact that that the information above on your After Visit Summary has been reviewed and is understood.  Full responsibility of the confidentiality of this discharge information lies with you and/or your care-partner.

## 2023-04-24 NOTE — Op Note (Signed)
[facility] Patient Name: Joyce Brock Procedure Date: 04/24/2023 8:03 AM MRN: [id number] Endoscopist: Meryl Dare , MD, [phone] Age: 57 Referring MD:  Date of Birth: [birth year] Gender: Female Account #: [id number] Procedure:                Colonoscopy Indications:              Surveillance: Personal history of adenomatous                            polyps on last colonoscopy 5 years ago Medicines:                Monitored Anesthesia Care Procedure:                Pre-Anesthesia Assessment:                           - Prior to the procedure, a History and Physical                            was performed, and patient medications and                            allergies were reviewed. The patient's tolerance of                            previous anesthesia was also reviewed. The risks                            and benefits of the procedure and the sedation                            options and risks were discussed with the patient.                            All questions were answered, and informed consent                            was obtained. Prior Anticoagulants: The patient has                            taken no anticoagulant or antiplatelet agents. ASA                            Grade Assessment: II - A patient with mild systemic                            disease. After reviewing the risks and benefits,                            the patient was deemed in satisfactory condition to                            undergo the procedure.  After obtaining informed consent, the colonoscope                            was passed under direct vision. Throughout the                            procedure, the patient's blood pressure, pulse, and                            oxygen saturations were monitored continuously. The                            CF [id number] #[id number] was introduced through the anus                            and advanced to  the the cecum, identified by                            appendiceal orifice and ileocecal valve. The                            ileocecal valve, appendiceal orifice, and rectum                            were photographed. The quality of the bowel                            preparation was good. The colonoscopy was performed                            without difficulty. The patient tolerated the                            procedure well. Scope In: 8:51:22 AM Scope Out: 9:07:16 AM Scope Withdrawal Time: 0 hours 12 minutes 55 seconds  Total Procedure Duration: 0 hours 15 minutes 54 seconds  Findings:                 The perianal and digital rectal examinations were                            normal.                           Two sessile polyps were found in the ascending                            colon and cecum. The polyps were 3 to 4 mm in size.                            These polyps were removed with a cold biopsy                            forceps. Resection and retrieval were complete.  A single small localized angioectasia without                            bleeding was found in the cecum.                           Multiple small-mouthed diverticula were found in                            the left colon. There was no evidence of                            diverticular bleeding.                           Internal hemorrhoids were found during                            retroflexion. The hemorrhoids were moderate and                            Grade I (internal hemorrhoids that do not prolapse).                           The exam was otherwise without abnormality on                            direct and retroflexion views. Complications:            No immediate complications. Estimated blood loss:                            None. Estimated Blood Loss:     Estimated blood loss: none. Impression:               - Two 3 to 4 mm polyps in the ascending colon  and                            in the cecum, removed with a cold biopsy forceps.                            Resected and retrieved.                           - A single non-bleeding colonic angioectasia.                           - Mild diverticulosis in the left colon.                           - Internal hemorrhoids.                           - The examination was otherwise normal on direct  and retroflexion views. Recommendation:           - Repeat colonoscopy after studies are complete for                            surveillance based on pathology results.                           - Patient has a contact number available for                            emergencies. The signs and symptoms of potential                            delayed complications were discussed with the                            patient. Return to normal activities tomorrow.                            Written discharge instructions were provided to the                            patient.                           - Resume previous diet adding high fiber.                           - Continue present medications.                           - Await pathology results. Meryl Dare, MD 04/24/2023 9:11:09 AM This report has been signed electronically.

## 2023-04-24 NOTE — Progress Notes (Signed)
History & Physical  Primary Care Physician:  Excell Seltzer, MD Primary Gastroenterologist: Claudette Head, MD  Impression / Plan:  Personal history of adenomatous colon polyps for surveillance colonoscopy  CHIEF COMPLAINT:  Personal history of colon polyps   HPI: Joyce Brock is a 57 y.o. female with a personal history of adenomatous colon polyps for surveillance colonoscopy.    Past Medical History:  Diagnosis Date   Allergy    Anxiety    GERD (gastroesophageal reflux disease)    with certain foods/take OTC PRN meds   Hyperlipidemia    Migraines     Past Surgical History:  Procedure Laterality Date   CHOLECYSTECTOMY     COLONOSCOPY  2019   MS-MAC-suprep (good after lavage)-TA x 3/hems/tics    Prior to Admission medications   Medication Sig Start Date End Date Taking? Authorizing Provider  atorvastatin (LIPITOR) 10 MG tablet Take 1 tablet (10 mg total) by mouth every other day. 03/22/23 03/21/24 Yes Bedsole, Amy E, MD  CALCIUM-VITAMIN D PO Take 1 tablet by mouth daily at 6 (six) AM.   Yes [provider]  cetirizine (ZYRTEC) 10 MG tablet Take 1 tablet by mouth once daily as needed Patient taking differently: Take 10 mg by mouth daily. 10/14/20  Yes   ezetimibe (ZETIA) 10 MG tablet Take 1 tablet (10 mg total) by mouth daily. 11/23/22 05/27/23 Yes Agbor-Etang, Arlys John, MD  fluticasone (FLONASE) 50 MCG/ACT nasal spray Place 2 sprays into both nostrils daily. 04/11/23  Yes   metoprolol succinate (TOPROL XL) 25 MG 24 hr tablet Take 1 tablet (25 mg total) by mouth daily. 11/21/22  Yes Agbor-Etang, Arlys John, MD  PARoxetine (PAXIL) 10 MG tablet Take 1 tablet (10 mg total) by mouth daily. 02/26/23  Yes Bedsole, Amy E, MD  Probiotic Product (ALIGN PO) Take 1 tablet by mouth daily.   Yes [provider]  vitamin B-12 (CYANOCOBALAMIN) 1000 MCG tablet Take 1 tablet (1,000 mcg total) by mouth daily. 12/28/21  Yes Bedsole, Amy E, MD  acetaminophen (TYLENOL) 500 MG tablet  Take 1,000 mg by mouth every 6 (six) hours as needed.    [provider]  EPINEPHrine (EPIPEN 2-PAK) 0.3 mg/0.3 mL IJ SOAJ injection Inject 0.3 mg into the muscle as directed. 04/11/23     pimecrolimus (ELIDEL) 1 % cream Apply twice daily to eyelids at first sign of symptoms. Use until clear. 06/13/22   Willeen Niece, MD    Current Outpatient Medications  Medication Sig Dispense Refill   atorvastatin (LIPITOR) 10 MG tablet Take 1 tablet (10 mg total) by mouth every other day. 45 tablet 3   CALCIUM-VITAMIN D PO Take 1 tablet by mouth daily at 6 (six) AM.     cetirizine (ZYRTEC) 10 MG tablet Take 1 tablet by mouth once daily as needed (Patient taking differently: Take 10 mg by mouth daily.) 60 tablet 14   ezetimibe (ZETIA) 10 MG tablet Take 1 tablet (10 mg total) by mouth daily. 90 tablet 3   fluticasone (FLONASE) 50 MCG/ACT nasal spray Place 2 sprays into both nostrils daily. 16 g 11   metoprolol succinate (TOPROL XL) 25 MG 24 hr tablet Take 1 tablet (25 mg total) by mouth daily. 90 tablet 1   PARoxetine (PAXIL) 10 MG tablet Take 1 tablet (10 mg total) by mouth daily. 90 tablet 0   Probiotic Product (ALIGN PO) Take 1 tablet by mouth daily.     vitamin B-12 (CYANOCOBALAMIN) 1000 MCG tablet Take 1 tablet (1,000  mcg total) by mouth daily. 90 tablet 0   acetaminophen (TYLENOL) 500 MG tablet Take 1,000 mg by mouth every 6 (six) hours as needed.     EPINEPHrine (EPIPEN 2-PAK) 0.3 mg/0.3 mL IJ SOAJ injection Inject 0.3 mg into the muscle as directed. 2 each 10   pimecrolimus (ELIDEL) 1 % cream Apply twice daily to eyelids at first sign of symptoms. Use until clear. 30 g 3   Current Facility-Administered Medications  Medication Dose Route Frequency Provider Last Rate Last Admin   0.9 %  sodium chloride infusion  500 mL Intravenous Continuous Meryl Dare, MD        Allergies as of 04/24/2023   (No Known Allergies)    Family History  Problem Relation Age of Onset   Osteoporosis Mother     Irritable bowel syndrome Mother    Cancer Sister 51       breast    Stomach cancer Paternal Grandfather 25   Colon cancer Neg Hx    Esophageal cancer Neg Hx    Rectal cancer Neg Hx    Colon polyps Neg Hx     Social History   Socioeconomic History   Marital status: Married    Spouse name: Not on file   Number of children: 2   Years of education: Not on file   Highest education level: Not on file  Occupational History   Not on file  Tobacco Use   Smoking status: Former   Smokeless tobacco: Never  Vaping Use   Vaping status: Never Used  Substance and Sexual Activity   Alcohol use: Yes    Comment: Rare   Drug use: No   Sexual activity: Yes    Birth control/protection: Post-menopausal  Other Topics Concern   Not on file  Social History Narrative   Right handed   No caffeine   Three story home   Social Determinants of Health   Financial Resource Strain: Not on file  Food Insecurity: Not on file  Transportation Needs: Not on file  Physical Activity: Not on file  Stress: Not on file  Social Connections: Not on file  Intimate Partner Violence: Not on file    Review of Systems:  All systems reviewed were negative except where noted in HPI.   Physical Exam:  General:  Alert, well-developed, in NAD Head:  Normocephalic and atraumatic. Eyes:  Sclera clear, no icterus.   Conjunctiva pink. Ears:  Normal auditory acuity. Mouth:  No deformity or lesions.  Neck:  Supple; no masses. Lungs:  Clear throughout to auscultation.   No wheezes, crackles, or rhonchi.  Heart:  Regular rate and rhythm; no murmurs. Abdomen:  Soft, nondistended, nontender. No masses, hepatomegaly. No palpable masses.  Normal bowel sounds.    Rectal:  Deferred   Msk:  Symmetrical without gross deformities. Extremities:  Without edema. Neurologic:  Alert and  oriented x 4; grossly normal neurologically. Skin:  Intact without significant lesions or rashes. Psych:  Alert and cooperative. Normal  mood and affect.   [name]. [name]  04/24/2023, 8:42 AM See [name], [name], to contact our on call provider

## 2023-04-24 NOTE — Progress Notes (Signed)
Called to room to assist during endoscopic procedure.  Patient ID and intended procedure confirmed with present staff. Received instructions for my participation in the procedure from the performing physician.  

## 2023-04-24 NOTE — Progress Notes (Signed)
Sedate, gd SR, tolerated procedure well, VSS, report to RN 

## 2023-04-24 NOTE — Progress Notes (Signed)
Pt's states no medical or surgical changes since previsit or office visit. 

## 2023-04-25 ENCOUNTER — Encounter: Payer: Self-pay | Admitting: Physical Therapy

## 2023-04-25 ENCOUNTER — Ambulatory Visit: Payer: 59 | Admitting: Physical Therapy

## 2023-04-25 ENCOUNTER — Telehealth: Payer: Self-pay

## 2023-04-25 DIAGNOSIS — M25611 Stiffness of right shoulder, not elsewhere classified: Secondary | ICD-10-CM

## 2023-04-25 DIAGNOSIS — M79621 Pain in right upper arm: Secondary | ICD-10-CM | POA: Diagnosis not present

## 2023-04-25 DIAGNOSIS — M25511 Pain in right shoulder: Secondary | ICD-10-CM | POA: Diagnosis not present

## 2023-04-25 DIAGNOSIS — J301 Allergic rhinitis due to pollen: Secondary | ICD-10-CM | POA: Diagnosis not present

## 2023-04-25 DIAGNOSIS — M6281 Muscle weakness (generalized): Secondary | ICD-10-CM

## 2023-04-25 DIAGNOSIS — G8929 Other chronic pain: Secondary | ICD-10-CM

## 2023-04-25 NOTE — Telephone Encounter (Signed)
  Follow up Call-     04/24/2023    8:29 AM  Call back number  Post procedure Call Back phone  # [phone]  Permission to leave phone message Yes     Patient questions:  Do you have a fever, pain , or abdominal swelling? No. Pain Score  0 *  Have you tolerated food without any problems? Yes.    Have you been able to return to your normal activities? Yes.    Do you have any questions about your discharge instructions: Diet   No. Medications  No. Follow up visit  No.  Do you have questions or concerns about your Care? No.  Actions: * If pain score is 4 or above: No action needed, pain <4.

## 2023-04-25 NOTE — Therapy (Addendum)
OUTPATIENT PHYSICAL THERAPY TREATMENT   Patient Name: Joyce Brock MRN: [id number] DOB:August 11, 1965, 57 y.o., female Today's Date: 04/25/2023  END OF SESSION:  PT End of Session - 04/25/23 1908     Visit Number 17    Number of Visits 17    Date for PT Re-Evaluation 04/26/23    Authorization Type [place] [employer] SAVE reporting period from 03/19/2023    Authorization Time Period *only 4 modalities per visit*  VL 25 PT/OT combined  medial review after 25th visit    Authorization - Visit Number 17    Authorization - Number of Visits 25    Progress Note Due on Visit 20    PT Start Time 1522    PT Stop Time 1600    PT Time Calculation (min) 38 min    Activity Tolerance Patient tolerated treatment well;No increased pain    Behavior During Therapy WFL for tasks assessed/performed             Past Medical History:  Diagnosis Date   Allergy    Anxiety    GERD (gastroesophageal reflux disease)    with certain foods/take OTC PRN meds   Hyperlipidemia    Migraines    Past Surgical History:  Procedure Laterality Date   CHOLECYSTECTOMY     COLONOSCOPY  2019   MS-MAC-suprep (good after lavage)-TA x 3/hems/tics   Patient Active Problem List   Diagnosis Date Noted   GERD (gastroesophageal reflux disease) 01/16/2020   Tachycardia    Pulmonary nodule    GAD (generalized anxiety disorder) 10/04/2018   Panic attack 10/04/2018   Atopic dermatitis 01/18/2017   Verruca vulgaris 11/15/2012   HYPERCHOLESTEROLEMIA 02/05/2009   Allergic rhinitis 10/30/2008   PSORIASIS 10/30/2008   URINARY INCONTINENCE, MIXED 10/30/2008    PCP: Excell Seltzer, MD  REFERRING PROVIDER: Excell Seltzer, MD  REFERRING DIAG: acute pain of right shoulder  THERAPY DIAG:  Chronic right shoulder pain  Pain in right upper arm  Stiffness of right shoulder, not elsewhere classified  Muscle weakness (generalized)  Rationale for Evaluation and Treatment: Rehabilitation  ONSET DATE: 6 months or  more from PT evaluation  PERTINENT HISTORY: Patient is a 57 y.o. female who presents to outpatient physical therapy with a referral for medical diagnosis acute pain of right shoulder. This patient's chief complaints consist of pain over the right posterior arm and lateral arm, difficulty using right arm, leading to the following functional deficits: difficulty working, using the computer mouse, reaching, dressing, pickleball, grooming, pushing, making the bed, housework, taking impact through the right UE, reaching (out, behind body, to back seat), sleeping. Relevant past medical history and comorbidities include has HYPERCHOLESTEROLEMIA; ALLERGIC RHINITIS; PSORIASIS; URINARY INCONTINENCE, MIXED; Palpitations; Rectal pain; Verruca vulgaris; Atopic dermatitis; GAD (generalized anxiety disorder); Panic attack; Vasovagal syncope; Pulmonary nodule; Tachycardia; GERD (gastroesophageal reflux disease); Tingling in extremities; Acute diarrhea; Acute pain of right shoulder; and Acute pain of left knee on their problem list. She  has a past medical history of Allergy, Anxiety, GERD (gastroesophageal reflux disease), Hyperlipidemia, and Migraines. She has a past surgical history that includes Cholecystectomy. Patient denies hx of cancer, stroke, seizures, lung problems, heart problems, diabetes, unexplained weight loss, unexplained changes in bowel or bladder problems, unexplained stumbling or dropping things, osteoporosis, and spinal surgery  SUBJECTIVE:  SUBJECTIVE STATEMENT: Patient states she is feeling okay and recovered from colonoscopy. She states she is continuing to do her shoulder exercises and they seem to be getting easier.  She has no pain currently.   PAIN:  NPRS: 0/10   PATIENT GOALS: "to be able to do without the hurting  and not damaging to the point I would need surgery or anything like that"  NEXT MD VISIT: no follow up for this specific problem  OBJECTIVE  TODAY'S TREATMENT:  Therapeutic exercise: to centralize symptoms and improve ROM, strength, muscular endurance, and activity tolerance required for successful completion of functional activities.  - NuStep level 4 using bilateral upper and lower extremities. Seat/handle setting 8/9. For improved extremity mobility, muscular endurance, and activity tolerance; and to induce the analgesic effect of aerobic exercise, stimulate improved joint nutrition, and prepare body structures and systems for following interventions. x 5  minutes. Average SPM = 74. - standing R shoulder AROM overhead assessment before/after interventions throughout session to help assess success fo care (Manual therapy - see below) - review of standing R AAROM flexion in doorway with forwards lean to improve end range stretch.  - step standing AAROM R shoulder flexion on wall with caudal glide provided by strong band over head of humerous. Band ancored under R foot (back) and left hand pulling band loop towards floor. Several sets to tolerance. - standing face pull to over head press with 5# cable (discontinued due to feeling it strongly in upper traps) - standing face pull, 1x10 at 5# cable  Manual therapy: to reduce pain and tissue tension, improve range of motion, neuromodulation, in order to promote improved ability to complete functional activities. SUPINE/HOOKLYING - R GHJ mobilizations grade II-IV: caudal glide grade at 90-130 degrees scaption,  3-4x30-40 seconds with and without strap assist.  - STM with sustained pressure (with palpable softening and reported decreased pain with continued pressure) to right subscapularis muscle, until softening felt in muscle.  - PROM flexion/scaption between other techniques to assess response (pain continues at posterior shoulder at end range  flexion, but slightly improved ROM).   Pt required multimodal cuing for proper technique and to facilitate improved neuromuscular control, strength, range of motion, and functional ability resulting in improved performance and form.  PATIENT EDUCATION: Education details: updates in HEP, increased visit frequency recommendation and further visits.  Education on HEP including handout. Person educated: Patient Education method: Explanation, Demonstration, Tactile cues, Verbal cues, and Handouts Education comprehension: verbalized understanding, returned demonstration, and needs further education  HOME EXERCISE PROGRAM: Access Code: [email or web address] URL: [email or web address] Date: 04/04/2023 Prepared by: Norton Blizzard  Exercises - Standing Shoulder Abduction Slides at Wall  - 1 x daily - 2 sets - 10 reps - 4 seconds hold - Scaption Wall Slide with Towel  - 1 x daily - 2 sets - 10 reps - 4 seconds hold - Push-Up on Counter  - 1 x daily - 2 sets - 15 reps - Standing Single Arm Shoulder Abduction with Resistance  - 1 x daily - 2 sets - 15 reps - Prone Horizontal Abduction with Palms Down  - 3-4 x weekly - 3 sets - 15 reps - 1 minute rest - Prone Lower Trapezius Strengthening on Swiss Ball  - 3-4 x weekly - 3 sets - 15 reps - 1 minute rest between sets - Shoulder External Rotation in Abduction with Anchored Resistance  - 3-4 x weekly - 2-3 sets - 20 reps - 45 seconds rest - Shoulder Internal  Rotation in Abduction with Resistance  - 3-4 x weekly - 2-3 sets - 20 reps - 45 seconds rest  HOME EXERCISE PROGRAM [[email or web address]] View at "[email or web address]" using code: [email or web address] PROM Shoulder Internal Rotation (IR) with Towel - Up Back -  Repeat 10 Repetitions, Hold 4 Seconds, Complete 2 Sets, Perform 1 Times a Day  [email or web address] View at "[email or web address]" using code: [id number] GHJ self mobilization Repeat 10 Times Hold 5 Seconds Complete 2 Sets Perform 1 Times a Day   ASSESSMENT:  CLINICAL  IMPRESSION: Patient arrives with improved AROM and less pain per observation and report but is not pain free. Today's session continued working on techniques for improved ROM followed by strengthening in increased ROM. She continues to respond well to soft tissue release at right subscapularis. Plan to continue with similar interventions as tolerated next session. Patient would benefit from continued management of limiting condition by skilled physical therapist to address remaining impairments and functional limitations to work towards stated goals and return to PLOF or maximal functional independence.   OBJECTIVE IMPAIRMENTS: decreased activity tolerance, decreased coordination, decreased endurance, decreased knowledge of condition, difficulty walking, decreased ROM, decreased strength, hypomobility, impaired perceived functional ability, increased muscle spasms, impaired flexibility, impaired UE functional use, improper body mechanics, and pain.   ACTIVITY LIMITATIONS: carrying, lifting, sleeping, bed mobility, bathing, dressing, reach over head, and caring for others  PARTICIPATION LIMITATIONS: meal prep, cleaning, laundry, community activity, occupation, yard work, and   difficulty working, using the Firefighter, reaching, dressing, pickleball, grooming, pushing, making the bed, housework, taking impact through the right UE, reaching (out, behind body, to back seat), sleeping  PERSONAL FACTORS: Age, Past/current experiences, Time since onset of injury/illness/exacerbation, and 3+ comorbidities:   HYPERCHOLESTEROLEMIA; ALLERGIC RHINITIS; PSORIASIS; URINARY INCONTINENCE, MIXED; Palpitations; Rectal pain; Verruca vulgaris; Atopic dermatitis; GAD (generalized anxiety disorder); Panic attack; Vasovagal syncope; Pulmonary nodule; Tachycardia; GERD (gastroesophageal reflux disease); Tingling in extremities; Acute diarrhea; Acute pain of right shoulder; and Acute pain of left knee on their problem list.  She  has a past medical history of Allergy, Anxiety, GERD (gastroesophageal reflux disease), Hyperlipidemia, and Migraines are also affecting patient's functional outcome.   REHAB POTENTIAL: Good  CLINICAL DECISION MAKING: Evolving/moderate complexity  EVALUATION COMPLEXITY: Moderate   GOALS: Goals reviewed with patient? No  SHORT TERM GOALS: Target date: 02/15/2023  Patient will be independent with initial home exercise program for self-management of symptoms. Baseline: Initial HEP provided at IE (02/01/23); Goal status: In-progress  LONG TERM GOALS: Target date: 04/26/2023  Patient will be independent with a long-term home exercise program for self-management of symptoms.  Baseline: Initial HEP provided at IE (02/01/23); currently participating well (03/19/2023);  Goal status: In-progress  2.  Patient will demonstrate improved FOTO to equal or greater than 68 by visit #10 to demonstrate improvement in overall condition and self-reported functional ability.  Baseline: 61 (02/01/23); 62 at visit #5 (02/21/2023);  Goal status: In-progress  3.  Patient will demonstrate R shoulder AROM equal or greater than L shoulder AROM to improve her ability to complete daily activities such as working, dressing, and playing pickleball.  Baseline: significant limitations - see objective (02/01/23); Goal status: In-progress  4.  Patient will demonstrate R shoulder MMT equal or greater than L shoulder MMT with no increase in concordant pain to improve her ability to complete valued activities such as dressing, reaching, pushing, working, and pickleball.  Baseline: significant limitations (02/01/23); Goal status: In-progress  5.  Patient will complete community, work and/or recreational activities with 75%  less limitation due to current condition.  Baseline: difficulty working, using the computer mouse, reaching, dressing, pickleball, grooming, pushing, making the bed, housework, taking impact through  the right UE, reaching (out, behind body, to back seat), sleeping (02/01/23); estimates 75% improvement, but there are still things like reaching behind her back that is not back to where she was (03/19/2023);  Goal status: MET  PLAN:  PT FREQUENCY: 1-2x/week  PT DURATION: 12 weeks  PLANNED INTERVENTIONS: Therapeutic exercises, Therapeutic activity, Neuromuscular re-education, Patient/Family education, Self Care, Joint mobilization, Dry Needling, Electrical stimulation, Spinal mobilization, Cryotherapy, Moist heat, Manual therapy, and Re-evaluation  PLAN FOR NEXT SESSION: update HEP as appropriate, progressive shoulder girdle/UE/postural/functional strengthening and ROM exercises as tolerated, manual therapy and dry needling as needed. Education.   Consider the following:  Continue with joint mobilizations and addressing subscapularis trigger points.  Mobilization with movement for overhead movement with potential to add to HEP.   Matthew Saras, Student-PT   Student physical therapist under direct supervision of licensed physical therapists during the entirety of the session.   Luretha Murphy. Ilsa Iha, PT, DPT 04/25/23, 7:21 PM  [facility] [address] [place], [place] [address] P: [phone] I F: [phone]

## 2023-04-26 LAB — SURGICAL PATHOLOGY

## 2023-04-30 ENCOUNTER — Ambulatory Visit: Payer: 59 | Admitting: Physical Therapy

## 2023-04-30 DIAGNOSIS — M6281 Muscle weakness (generalized): Secondary | ICD-10-CM | POA: Diagnosis not present

## 2023-04-30 DIAGNOSIS — M25611 Stiffness of right shoulder, not elsewhere classified: Secondary | ICD-10-CM | POA: Diagnosis not present

## 2023-04-30 DIAGNOSIS — M25511 Pain in right shoulder: Secondary | ICD-10-CM | POA: Diagnosis not present

## 2023-04-30 DIAGNOSIS — G8929 Other chronic pain: Secondary | ICD-10-CM

## 2023-04-30 DIAGNOSIS — M79621 Pain in right upper arm: Secondary | ICD-10-CM

## 2023-04-30 NOTE — Therapy (Incomplete)
OUTPATIENT PHYSICAL THERAPY TREATMENT / PROGRESS NOTE / RE-CERTIFCATION Dates of reporting from 03/19/2023 to 04/30/2023   Patient Name: Joyce Brock MRN: [id number] DOB:[birth year], 57 y.o., female Today's Date: 05/01/2023  END OF SESSION:  PT End of Session - 04/30/23 1904     Visit Number 18    Number of Visits 25    Date for PT Re-Evaluation 07/23/23    Authorization Type [place] [employer] SAVE reporting period from 03/19/2023    Authorization Time Period *only 4 modalities per visit*  VL 25 PT/OT combined  medial review after 25th visit    Authorization - Visit Number 18    Authorization - Number of Visits 25    Progress Note Due on Visit 20    PT Start Time 1902    PT Stop Time 1942    PT Time Calculation (min) 40 min    Activity Tolerance Patient tolerated treatment well;No increased pain    Behavior During Therapy WFL for tasks assessed/performed              Past Medical History:  Diagnosis Date   Allergy    Anxiety    GERD (gastroesophageal reflux disease)    with certain foods/take OTC PRN meds   Hyperlipidemia    Migraines    Past Surgical History:  Procedure Laterality Date   CHOLECYSTECTOMY     COLONOSCOPY  2019   MS-MAC-suprep (good after lavage)-TA x 3/hems/tics   Patient Active Problem List   Diagnosis Date Noted   GERD (gastroesophageal reflux disease) 01/16/2020   Tachycardia    Pulmonary nodule    GAD (generalized anxiety disorder) 10/04/2018   Panic attack 10/04/2018   Atopic dermatitis 01/18/2017   Verruca vulgaris 11/15/2012   HYPERCHOLESTEROLEMIA 02/05/2009   Allergic rhinitis 10/30/2008   PSORIASIS 10/30/2008   URINARY INCONTINENCE, MIXED 10/30/2008    PCP: Excell Seltzer, MD  REFERRING PROVIDER: Excell Seltzer, MD  REFERRING DIAG: acute pain of right shoulder  THERAPY DIAG:  Chronic right shoulder pain - Plan: PT plan of care cert/re-cert  Pain in right upper arm - Plan: PT plan of care cert/re-cert  Stiffness of  right shoulder, not elsewhere classified - Plan: PT plan of care cert/re-cert  Muscle weakness (generalized) - Plan: PT plan of care cert/re-cert  Rationale for Evaluation and Treatment: Rehabilitation  ONSET DATE: 6 months or more from PT evaluation  PERTINENT HISTORY: Patient is a 57 y.o. female who presents to outpatient physical therapy with a referral for medical diagnosis acute pain of right shoulder. This patient's chief complaints consist of pain over the right posterior arm and lateral arm, difficulty using right arm, leading to the following functional deficits: difficulty working, using the computer mouse, reaching, dressing, pickleball, grooming, pushing, making the bed, housework, taking impact through the right UE, reaching (out, behind body, to back seat), sleeping. Relevant past medical history and comorbidities include has HYPERCHOLESTEROLEMIA; ALLERGIC RHINITIS; PSORIASIS; URINARY INCONTINENCE, MIXED; Palpitations; Rectal pain; Verruca vulgaris; Atopic dermatitis; GAD (generalized anxiety disorder); Panic attack; Vasovagal syncope; Pulmonary nodule; Tachycardia; GERD (gastroesophageal reflux disease); Tingling in extremities; Acute diarrhea; Acute pain of right shoulder; and Acute pain of left knee on their problem list. She  has a past medical history of Allergy, Anxiety, GERD (gastroesophageal reflux disease), Hyperlipidemia, and Migraines. She has a past surgical history that includes Cholecystectomy. Patient denies hx of cancer, stroke, seizures, lung problems, heart problems, diabetes, unexplained weight loss, unexplained changes in bowel or bladder problems, unexplained stumbling or dropping  things, osteoporosis, and spinal surgery  SUBJECTIVE:                                                                                                                                                                                      SUBJECTIVE STATEMENT: Patient states her R shoulder is  feeling good. [place] HEP while there. She feels like she is making progress in PT.   PAIN:  NPRS: 0/10   PATIENT GOALS: "to be able to do without the hurting and not damaging to the point I would need surgery or anything like that"  NEXT MD VISIT: no follow up for this specific problem  OBJECTIVE  SELF-REPORTED FUNCTION FOTO score: 74/100 (shoulder questionnaire)   PERIPHERAL JOINT MOTION (in degrees) ACTIVE RANGE OF MOTION (AROM) *Indicates pain 02/01/23 03/19/23 04/30/23  Joint/Motion R/L R/L R/L  Shoulder        Flexion 134*/176 138*/ 148*/  Extension 60*/70 61*/ 50/  Abduction  90*/180 147*/ 155*/  External rotation 85*/119 90*/ 85*/  Internal rotation L5*/T3 L2*/ T11*/  Comments:  02/01/2023: B elbows, wrists, and hands grossly WFL for basic activities without concordant or any pain.    MUSCLE PERFORMANCE (MMT):  *Indicates pain 02/01/23 03/19/23 04/30/23  Joint/Motion R/L R/L R/L  Shoulder        Flexion 4+*/5 4+/5 4+*/5  Abduction (C5) 4+*/5 4+*/5 4+/5  External rotation 4*/4 4+/4 4+/4+  Internal rotation 5*/5 5/5 5/5  Elbow        Flexion (C6) 5/5 / /  Extension (C7) 4+/5 / /  Wrist        Flexion (C7) 4/5 / /  Extension (C6) 4+/5 / /  Hand        Thumb extension (C8) B WFL / /  Finger abduction (T1) B WFL / /  Comments:    TODAY'S TREATMENT:  Therapeutic exercise: to centralize symptoms and improve ROM, strength, muscular endurance, and activity tolerance required for successful completion of functional activities.  - Upper body ergometer level 7 to encourage joint nutrition, warm tissue, induce analgesic effect of aerobic exercise, improve muscular strength and endurance,  and prepare for remainder of session. 5 min total changing directions ever 1 minute.  - measurements to assess progress (see above) (Manual therapy - see below)  Superset:  - standing face pull to over head press with 5# cable, 3x10 (cuing  to keep ribs down and not compensate for lack of shoulder flexion with spine hyperextension).  - wall walks with B isometric ER with GTB looped around hands, 3x10 , cuing to make it more challenging  by moving feet back.   Manual therapy: to reduce pain and tissue tension, improve range of motion, neuromodulation, in order to promote improved ability to complete functional activities. SUPINE/HOOKLYING - R GHJ mobilizations grade II-IV: caudal glide grade at 130 degrees scaption,  3-x30-40 seconds - STM with sustained pressure (with palpable softening and reported decreased pain with continued pressure) to right subscapularis muscle, until softening felt in muscle.  - PROM flexion/scaption between other techniques to assess response (pain continues at posterior shoulder at end range flexion, but improved ROM).   Pt required multimodal cuing for proper technique and to facilitate improved neuromuscular control, strength, range of motion, and functional ability resulting in improved performance and form.  PATIENT EDUCATION: Education details: updates in HEP, increased visit frequency recommendation and further visits.  Education on HEP including handout. Person educated: Patient Education method: Explanation, Demonstration, Tactile cues, Verbal cues, and Handouts Education comprehension: verbalized understanding, returned demonstration, and needs further education  HOME EXERCISE PROGRAM: Access Code: [email or web address] URL: [email or web address] Date: 04/04/2023 Prepared by: Norton Blizzard  Exercises - Standing Shoulder Abduction Slides at Wall  - 1 x daily - 2 sets - 10 reps - 4 seconds hold - Scaption Wall Slide with Towel  - 1 x daily - 2 sets - 10 reps - 4 seconds hold - Push-Up on Counter  - 1 x daily - 2 sets - 15 reps - Standing Single Arm Shoulder Abduction with Resistance  - 1 x daily - 2 sets - 15 reps - Prone Horizontal Abduction with Palms Down  - 3-4 x weekly - 3 sets - 15 reps  - 1 minute rest - Prone Lower Trapezius Strengthening on Swiss Ball  - 3-4 x weekly - 3 sets - 15 reps - 1 minute rest between sets - Shoulder External Rotation in Abduction with Anchored Resistance  - 3-4 x weekly - 2-3 sets - 20 reps - 45 seconds rest - Shoulder Internal Rotation in Abduction with Resistance  - 3-4 x weekly - 2-3 sets - 20 reps - 45 seconds rest  HOME EXERCISE PROGRAM [[email or web address]] View at "[email or web address]" using code: [email or web address] PROM Shoulder Internal Rotation (IR) with Towel - Up Back -  Repeat 10 Repetitions, Hold 4 Seconds, Complete 2 Sets, Perform 1 Times a Day  [email or web address] View at "[email or web address]" using code: [id number] GHJ self mobilization Repeat 10 Times Hold 5 Seconds Complete 2 Sets Perform 1 Times a Day   ASSESSMENT:  CLINICAL IMPRESSION: Patient has attended 18 physical therapy sessions since starting current episode of care on 02/01/2023. Patient has demonstrated good participation in HEP and made steady progress towards goals. She is overall feeling a lot better and has met or progressed towards all of her goals including meeting her FOTO goal and estimating improvement in functional limitations at 85%. She continues to have AROM/PROM limitations with end range pain for overhead motions and slightly less strength with discomfort in right shoulder movements compared to right. She seems to be responding to more frequent manual interventions and would benefit from 2-4 weeks of this to show improved stability of recovery before discharge. Patient would benefit from continued management of limiting condition by skilled physical therapist to address remaining impairments and functional limitations to work towards stated goals and return to PLOF or maximal functional independence.    OBJECTIVE IMPAIRMENTS: decreased activity tolerance, decreased coordination, decreased endurance, decreased knowledge of condition, difficulty walking, decreased ROM, decreased strength,  hypomobility, impaired perceived functional ability, increased muscle spasms, impaired flexibility, impaired UE functional  use, improper body mechanics, and pain.   ACTIVITY LIMITATIONS: carrying, lifting, sleeping, bed mobility, bathing, dressing, reach over head, and caring for others  PARTICIPATION LIMITATIONS: meal prep, cleaning, laundry, community activity, occupation, yard work, and   difficulty working, using the Firefighter, reaching, dressing, pickleball, grooming, pushing, making the bed, housework, taking impact through the right UE, reaching (out, behind body, to back seat), sleeping  PERSONAL FACTORS: Age, Past/current experiences, Time since onset of injury/illness/exacerbation, and 3+ comorbidities:   HYPERCHOLESTEROLEMIA; ALLERGIC RHINITIS; PSORIASIS; URINARY INCONTINENCE, MIXED; Palpitations; Rectal pain; Verruca vulgaris; Atopic dermatitis; GAD (generalized anxiety disorder); Panic attack; Vasovagal syncope; Pulmonary nodule; Tachycardia; GERD (gastroesophageal reflux disease); Tingling in extremities; Acute diarrhea; Acute pain of right shoulder; and Acute pain of left knee on their problem list. She  has a past medical history of Allergy, Anxiety, GERD (gastroesophageal reflux disease), Hyperlipidemia, and Migraines are also affecting patient's functional outcome.   REHAB POTENTIAL: Good  CLINICAL DECISION MAKING: Evolving/moderate complexity  EVALUATION COMPLEXITY: Moderate   GOALS: Goals reviewed with patient? No  SHORT TERM GOALS: Target date: 02/15/2023  Patient will be independent with initial home exercise program for self-management of symptoms. Baseline: Initial HEP provided at [facility] (02/01/23); Goal status: In-progress  LONG TERM GOALS: Target date: 04/26/2023. Target date for all unmet goals updated to 07/23/2023 on 04/30/2023.   Patient will be independent with a long-term home exercise program for self-management of symptoms.  Baseline: Initial HEP provided at  [facility] (02/01/23); currently participating well (03/19/2023; 04/30/23);  Goal status: In-progress  2.  Patient will demonstrate improved FOTO to equal or greater than 68 by visit #10 to demonstrate improvement in overall condition and self-reported functional ability.  Baseline: 61 (02/01/23); 62 at visit #5 (02/21/2023); 71 at visit #10 (03/19/2023); 74 at visit #20 (05/01/2023);  Goal status: MET  3.  Patient will demonstrate R shoulder AROM equal or greater than L shoulder AROM to improve her ability to complete daily activities such as working, dressing, and playing pickleball.  Baseline: significant limitations - see objective (02/01/23); improved in mots motions but not met (04/30/2023);  Goal status: In-progress  4.  Patient will demonstrate R shoulder MMT equal or greater than L shoulder MMT with no increase in concordant pain to improve her ability to complete valued activities such as dressing, reaching, pushing, working, and pickleball.  Baseline: significant limitations (02/01/23); improved but not met (04/30/2023) Goal status: In-progress  5.  Patient will complete community, work and/or recreational activities with 75% less limitation due to current condition.  Baseline: difficulty working, using the computer mouse, reaching, dressing, pickleball, grooming, pushing, making the bed, housework, taking impact through the right UE, reaching (out, behind body, to back seat), sleeping (02/01/23); estimates 75% improvement, but there are still things like reaching behind her back that is not back to where she was (03/19/2023);  estimates 80% improvement (04/30/2023);  Goal status: MET  PLAN:  PT FREQUENCY: 1-2x/week  PT DURATION: 12 weeks  PLANNED INTERVENTIONS: Therapeutic exercises, Therapeutic activity, Neuromuscular re-education, Patient/Family education, Self Care, Joint mobilization, Dry Needling, Electrical stimulation, Spinal mobilization, Cryotherapy, Moist heat, Manual therapy, and  Re-evaluation  PLAN FOR NEXT SESSION: update HEP as appropriate, progressive shoulder girdle/UE/postural/functional strengthening and ROM exercises as tolerated, manual therapy and dry needling as needed. Education.   Consider the following:  Continue with joint mobilizations and addressing subscapularis trigger points.  Mobilization with movement for overhead movement with potential to add to HEP.    Luretha Murphy. Ilsa Iha, PT, DPT 05/01/23, 5:17  PM  [facility] [address] [place], [place] [address] P: [phone] I F: [phone]

## 2023-05-01 ENCOUNTER — Encounter: Payer: Self-pay | Admitting: Physical Therapy

## 2023-05-02 DIAGNOSIS — J301 Allergic rhinitis due to pollen: Secondary | ICD-10-CM | POA: Diagnosis not present

## 2023-05-03 ENCOUNTER — Ambulatory Visit: Payer: 59 | Admitting: Physical Therapy

## 2023-05-04 DIAGNOSIS — Z1231 Encounter for screening mammogram for malignant neoplasm of breast: Secondary | ICD-10-CM | POA: Diagnosis not present

## 2023-05-04 LAB — HM MAMMOGRAPHY

## 2023-05-07 ENCOUNTER — Ambulatory Visit: Payer: 59 | Admitting: Physical Therapy

## 2023-05-07 ENCOUNTER — Encounter: Payer: Self-pay | Admitting: Family Medicine

## 2023-05-07 ENCOUNTER — Encounter: Payer: Self-pay | Admitting: Physical Therapy

## 2023-05-07 DIAGNOSIS — G8929 Other chronic pain: Secondary | ICD-10-CM

## 2023-05-07 DIAGNOSIS — M25511 Pain in right shoulder: Secondary | ICD-10-CM | POA: Diagnosis not present

## 2023-05-07 DIAGNOSIS — M6281 Muscle weakness (generalized): Secondary | ICD-10-CM

## 2023-05-07 DIAGNOSIS — M79621 Pain in right upper arm: Secondary | ICD-10-CM | POA: Diagnosis not present

## 2023-05-07 DIAGNOSIS — M25611 Stiffness of right shoulder, not elsewhere classified: Secondary | ICD-10-CM | POA: Diagnosis not present

## 2023-05-07 NOTE — Therapy (Signed)
OUTPATIENT PHYSICAL THERAPY TREATMENT    Patient Name: Joyce Brock MRN: [id number] DOB:[birth year], 57 y.o., female Today's Date: 05/07/2023  END OF SESSION:  PT End of Session - 05/07/23 1519     Visit Number 19    Number of Visits 25    Date for PT Re-Evaluation 07/23/23    Authorization Type [place] [employer] S[employer] reporting period from 03/19/2023    Authorization Time Period *only 4 modalities per visit*  VL 25 PT/OT combined  medial review after 25th visit    Authorization - Visit Number 19    Authorization - Number of Visits 25    Progress Note Due on Visit 20    PT Start Time 1517    PT Stop Time 1555    PT Time Calculation (min) 38 min    Activity Tolerance Patient tolerated treatment well;No increased pain    Behavior During Therapy WFL for tasks assessed/performed               Past Medical History:  Diagnosis Date   Allergy    Anxiety    GERD (gastroesophageal reflux disease)    with certain foods/take OTC PRN meds   Hyperlipidemia    Migraines    Past Surgical History:  Procedure Laterality Date   CHOLECYSTECTOMY     COLONOSCOPY  2019   MS-MAC-suprep (good after lavage)-TA x 3/hems/tics   Patient Active Problem List   Diagnosis Date Noted   GERD (gastroesophageal reflux disease) 01/16/2020   Tachycardia    Pulmonary nodule    GAD (generalized anxiety disorder) 10/04/2018   Panic attack 10/04/2018   Atopic dermatitis 01/18/2017   Verruca vulgaris 11/15/2012   HYPERCHOLESTEROLEMIA 02/05/2009   Allergic rhinitis 10/30/2008   PSORIASIS 10/30/2008   URINARY INCONTINENCE, MIXED 10/30/2008    PCP: Excell Seltzer, MD  REFERRING PROVIDER: Excell Seltzer, MD  REFERRING DIAG: acute pain of right shoulder  THERAPY DIAG:  Chronic right shoulder pain  Pain in right upper arm  Stiffness of right shoulder, not elsewhere classified  Muscle weakness (generalized)  Rationale for Evaluation and Treatment: Rehabilitation  ONSET DATE: 6 months  or more from PT evaluation  PERTINENT HISTORY: Patient is a 57 y.o. female who presents to outpatient physical therapy with a referral for medical diagnosis acute pain of right shoulder. This patient's chief complaints consist of pain over the right posterior arm and lateral arm, difficulty using right arm, leading to the following functional deficits: difficulty working, using the computer mouse, reaching, dressing, pickleball, grooming, pushing, making the bed, housework, taking impact through the right UE, reaching (out, behind body, to back seat), sleeping. Relevant past medical history and comorbidities include has HYPERCHOLESTEROLEMIA; ALLERGIC RHINITIS; PSORIASIS; URINARY INCONTINENCE, MIXED; Palpitations; Rectal pain; Verruca vulgaris; Atopic dermatitis; GAD (generalized anxiety disorder); Panic attack; Vasovagal syncope; Pulmonary nodule; Tachycardia; GERD (gastroesophageal reflux disease); Tingling in extremities; Acute diarrhea; Acute pain of right shoulder; and Acute pain of left knee on their problem list. She  has a past medical history of Allergy, Anxiety, GERD (gastroesophageal reflux disease), Hyperlipidemia, and Migraines. She has a past surgical history that includes Cholecystectomy. Patient denies hx of cancer, stroke, seizures, lung problems, heart problems, diabetes, unexplained weight loss, unexplained changes in bowel or bladder problems, unexplained stumbling or dropping things, osteoporosis, and spinal surgery  SUBJECTIVE:  SUBJECTIVE STATEMENT: Patient states her R shoulder is feeling well. She reports no pain upon arrival. She states she has not been doing her HEP as much as the PT would like. She missed her last PT session because of being very busy at work. She also did Thanksgiving meal with her  family yesterday.   PAIN:  NPRS: 0/10   PATIENT GOALS: "to be able to do without the hurting and not damaging to the point I would need surgery or anything like that"  NEXT MD VISIT: no follow up for this specific problem  OBJECTIVE  TODAY'S TREATMENT:  Therapeutic exercise: to centralize symptoms and improve ROM, strength, muscular endurance, and activity tolerance required for successful completion of functional activities.  - Upper body ergometer level 10 to encourage joint nutrition, warm tissue, induce analgesic effect of aerobic exercise, improve muscular strength and endurance,  and prepare for remainder of session. 5 min total changing directions ever 1 minute.  - seated/standing AROM B shoulder flexion/abduction between other techniques to assess response (pain less at posterior shoulder at end range flexion, but improved ROM)  (Manual therapy - see below)  Superset:  - standing face pull to over head press with 5# cable, 3x12 (cuing to keep ribs down and not compensate for lack of shoulder flexion with spine hyperextension).  - TRX rows 3x12 (plus extra reps to learn technique and get feet in optimal position).   Manual therapy: to reduce pain and tissue tension, improve range of motion, neuromodulation, in order to promote improved ability to complete functional activities. SUPINE/HOOKLYING - R GHJ posterior capsule AP mobs through humerus with R hand crossed over to left shoulder, 4x20 reps, grade III-IV,  - R shoulder flexion PROM stretch with contract/relax, 6/6 seconds, 2x3 - PROM flexion/scaption between other techniques to assess response (pain less at posterior shoulder at end range flexion, but improved ROM).   Pt required multimodal cuing for proper technique and to facilitate improved neuromuscular control, strength, range of motion, and functional ability resulting in improved performance and form.  PATIENT EDUCATION: Education details: updates in HEP, increased  visit frequency recommendation and further visits.  Education on HEP including handout. Person educated: Patient Education method: Explanation, Demonstration, Tactile cues, Verbal cues, and Handouts Education comprehension: verbalized understanding, returned demonstration, and needs further education  HOME EXERCISE PROGRAM: Access Code: [email or web address] URL: [email or web address] Date: 04/04/2023 Prepared by: Norton Blizzard  Exercises - Standing Shoulder Abduction Slides at Wall  - 1 x daily - 2 sets - 10 reps - 4 seconds hold - Scaption Wall Slide with Towel  - 1 x daily - 2 sets - 10 reps - 4 seconds hold - Push-Up on Counter  - 1 x daily - 2 sets - 15 reps - Standing Single Arm Shoulder Abduction with Resistance  - 1 x daily - 2 sets - 15 reps - Prone Horizontal Abduction with Palms Down  - 3-4 x weekly - 3 sets - 15 reps - 1 minute rest - Prone Lower Trapezius Strengthening on Swiss Ball  - 3-4 x weekly - 3 sets - 15 reps - 1 minute rest between sets - Shoulder External Rotation in Abduction with Anchored Resistance  - 3-4 x weekly - 2-3 sets - 20 reps - 45 seconds rest - Shoulder Internal Rotation in Abduction with Resistance  - 3-4 x weekly - 2-3 sets - 20 reps - 45 seconds rest  HOME EXERCISE PROGRAM [[email or web address]] View at "[email or web address]" using code: [email or web address] PROM Shoulder Internal Rotation (IR)  with Towel - Up Back -  Repeat 10 Repetitions, Hold 4 Seconds, Complete 2 Sets, Perform 1 Times a Day  [email or web address] View at "[email or web address]" using code: [id number] GHJ self mobilization Repeat 10 Times Hold 5 Seconds Complete 2 Sets Perform 1 Times a Day   ASSESSMENT:  CLINICAL IMPRESSION: Patient arrives with poor HEP participation but good carry over in pain and ROM improvement. She continues to have limited end range overhead motion with discomfort. Today's session utilized joint mobs to target the posterior capsule of the right shoulder and contract-relax technique that  improved her ROM and comfort. Continued with endurance/strength/motor control exercises for the shoulder girdle into improved ROM with good tolerance. Patient would benefit from continued management of limiting condition by skilled physical therapist to address remaining impairments and functional limitations to work towards stated goals and return to PLOF or maximal functional independence.   OBJECTIVE IMPAIRMENTS: decreased activity tolerance, decreased coordination, decreased endurance, decreased knowledge of condition, difficulty walking, decreased ROM, decreased strength, hypomobility, impaired perceived functional ability, increased muscle spasms, impaired flexibility, impaired UE functional use, improper body mechanics, and pain.   ACTIVITY LIMITATIONS: carrying, lifting, sleeping, bed mobility, bathing, dressing, reach over head, and caring for others  PARTICIPATION LIMITATIONS: meal prep, cleaning, laundry, community activity, occupation, yard work, and   difficulty working, using the Firefighter, reaching, dressing, pickleball, grooming, pushing, making the bed, housework, taking impact through the right UE, reaching (out, behind body, to back seat), sleeping  PERSONAL FACTORS: Age, Past/current experiences, Time since onset of injury/illness/exacerbation, and 3+ comorbidities:   HYPERCHOLESTEROLEMIA; ALLERGIC RHINITIS; PSORIASIS; URINARY INCONTINENCE, MIXED; Palpitations; Rectal pain; Verruca vulgaris; Atopic dermatitis; GAD (generalized anxiety disorder); Panic attack; Vasovagal syncope; Pulmonary nodule; Tachycardia; GERD (gastroesophageal reflux disease); Tingling in extremities; Acute diarrhea; Acute pain of right shoulder; and Acute pain of left knee on their problem list. She  has a past medical history of Allergy, Anxiety, GERD (gastroesophageal reflux disease), Hyperlipidemia, and Migraines are also affecting patient's functional outcome.   REHAB POTENTIAL: Good  CLINICAL DECISION  MAKING: Evolving/moderate complexity  EVALUATION COMPLEXITY: Moderate   GOALS: Goals reviewed with patient? No  SHORT TERM GOALS: Target date: 02/15/2023  Patient will be independent with initial home exercise program for self-management of symptoms. Baseline: Initial HEP provided at IE (02/01/23); Goal status: In-progress  LONG TERM GOALS: Target date: 04/26/2023. Target date for all unmet goals updated to 07/23/2023 on 04/30/2023.   Patient will be independent with a long-term home exercise program for self-management of symptoms.  Baseline: Initial HEP provided at IE (02/01/23); currently participating well (03/19/2023; 04/30/23);  Goal status: In-progress  2.  Patient will demonstrate improved FOTO to equal or greater than 68 by visit #10 to demonstrate improvement in overall condition and self-reported functional ability.  Baseline: 61 (02/01/23); 62 at visit #5 (02/21/2023); 71 at visit #10 (03/19/2023); 74 at visit #20 (05/01/2023);  Goal status: MET  3.  Patient will demonstrate R shoulder AROM equal or greater than L shoulder AROM to improve her ability to complete daily activities such as working, dressing, and playing pickleball.  Baseline: significant limitations - see objective (02/01/23); improved in mots motions but not met (04/30/2023);  Goal status: In-progress  4.  Patient will demonstrate R shoulder MMT equal or greater than L shoulder MMT with no increase in concordant pain to improve her ability to complete valued activities such as dressing, reaching, pushing, working, and pickleball.  Baseline: significant limitations (02/01/23); improved but not met (04/30/2023) Goal status: In-progress  5.  Patient will complete community, work and/or recreational activities with 75% less limitation due to current condition.  Baseline: difficulty working, using the computer mouse, reaching, dressing, pickleball, grooming, pushing, making the bed, housework, taking impact through  the right UE, reaching (out, behind body, to back seat), sleeping (02/01/23); estimates 75% improvement, but there are still things like reaching behind her back that is not back to where she was (03/19/2023);  estimates 80% improvement (04/30/2023);  Goal status: MET  PLAN:  PT FREQUENCY: 1-2x/week  PT DURATION: 12 weeks  PLANNED INTERVENTIONS: Therapeutic exercises, Therapeutic activity, Neuromuscular re-education, Patient/Family education, Self Care, Joint mobilization, Dry Needling, Electrical stimulation, Spinal mobilization, Cryotherapy, Moist heat, Manual therapy, and Re-evaluation  PLAN FOR NEXT SESSION: update HEP as appropriate, progressive shoulder girdle/UE/postural/functional strengthening and ROM exercises as tolerated, manual therapy and dry needling as needed. Education.   Consider the following:  Continue with joint mobilizations and addressing subscapularis trigger points.  Mobilization with movement for overhead movement with potential to add to HEP.    Luretha Murphy. Ilsa Iha, PT, DPT 05/07/23, 4:01 PM  [facility] [address] [place], [place] [address] P: [phone] I F: [phone]

## 2023-05-08 ENCOUNTER — Encounter: Payer: Self-pay | Admitting: Gastroenterology

## 2023-05-09 ENCOUNTER — Encounter: Payer: Self-pay | Admitting: Physical Therapy

## 2023-05-09 ENCOUNTER — Ambulatory Visit: Payer: 59 | Admitting: Physical Therapy

## 2023-05-09 DIAGNOSIS — G8929 Other chronic pain: Secondary | ICD-10-CM

## 2023-05-09 DIAGNOSIS — M6281 Muscle weakness (generalized): Secondary | ICD-10-CM | POA: Diagnosis not present

## 2023-05-09 DIAGNOSIS — M25611 Stiffness of right shoulder, not elsewhere classified: Secondary | ICD-10-CM | POA: Diagnosis not present

## 2023-05-09 DIAGNOSIS — M79621 Pain in right upper arm: Secondary | ICD-10-CM | POA: Diagnosis not present

## 2023-05-09 DIAGNOSIS — J301 Allergic rhinitis due to pollen: Secondary | ICD-10-CM | POA: Diagnosis not present

## 2023-05-09 DIAGNOSIS — M25511 Pain in right shoulder: Secondary | ICD-10-CM | POA: Diagnosis not present

## 2023-05-09 NOTE — Therapy (Addendum)
OUTPATIENT PHYSICAL THERAPY TREATMENT / PROGRESS NOTE Dates of reporting from 03/19/2023 to 05/09/2023   Patient Name: Joyce Brock MRN: [id number] DOB:Nov 10, 1965, 57 y.o., female Today's Date: 05/09/2023  END OF SESSION:  PT End of Session - 05/09/23 1520     Visit Number 20    Number of Visits 25    Date for PT Re-Evaluation 07/23/23    Authorization Type [place] [employer] S[employer] reporting period from 03/19/2023    Authorization Time Period *only 4 modalities per visit*  VL 25 PT/OT combined  medial review after 25th visit    Authorization - Visit Number 20    Authorization - Number of Visits 25    Progress Note Due on Visit 20    PT Start Time 1518    PT Stop Time 1556    PT Time Calculation (min) 38 min    Activity Tolerance Patient tolerated treatment well;No increased pain    Behavior During Therapy WFL for tasks assessed/performed                Past Medical History:  Diagnosis Date   Allergy    Anxiety    GERD (gastroesophageal reflux disease)    with certain foods/take OTC PRN meds   Hyperlipidemia    Migraines    Past Surgical History:  Procedure Laterality Date   CHOLECYSTECTOMY     COLONOSCOPY  2019   MS-MAC-suprep (good after lavage)-TA x 3/hems/tics   Patient Active Problem List   Diagnosis Date Noted   GERD (gastroesophageal reflux disease) 01/16/2020   Tachycardia    Pulmonary nodule    GAD (generalized anxiety disorder) 10/04/2018   Panic attack 10/04/2018   Atopic dermatitis 01/18/2017   Verruca vulgaris 11/15/2012   HYPERCHOLESTEROLEMIA 02/05/2009   Allergic rhinitis 10/30/2008   PSORIASIS 10/30/2008   URINARY INCONTINENCE, MIXED 10/30/2008    PCP: Excell Seltzer, MD  REFERRING PROVIDER: Excell Seltzer, MD  REFERRING DIAG: acute pain of right shoulder  THERAPY DIAG:  Chronic right shoulder pain  Pain in right upper arm  Stiffness of right shoulder, not elsewhere classified  Muscle weakness (generalized)  Rationale for  Evaluation and Treatment: Rehabilitation  ONSET DATE: 6 months or more from PT evaluation  PERTINENT HISTORY: Patient is a 57 y.o. female who presents to outpatient physical therapy with a referral for medical diagnosis acute pain of right shoulder. This patient's chief complaints consist of pain over the right posterior arm and lateral arm, difficulty using right arm, leading to the following functional deficits: difficulty working, using the computer mouse, reaching, dressing, pickleball, grooming, pushing, making the bed, housework, taking impact through the right UE, reaching (out, behind body, to back seat), sleeping. Relevant past medical history and comorbidities include has HYPERCHOLESTEROLEMIA; ALLERGIC RHINITIS; PSORIASIS; URINARY INCONTINENCE, MIXED; Palpitations; Rectal pain; Verruca vulgaris; Atopic dermatitis; GAD (generalized anxiety disorder); Panic attack; Vasovagal syncope; Pulmonary nodule; Tachycardia; GERD (gastroesophageal reflux disease); Tingling in extremities; Acute diarrhea; Acute pain of right shoulder; and Acute pain of left knee on their problem list. She  has a past medical history of Allergy, Anxiety, GERD (gastroesophageal reflux disease), Hyperlipidemia, and Migraines. She has a past surgical history that includes Cholecystectomy. Patient denies hx of cancer, stroke, seizures, lung problems, heart problems, diabetes, unexplained weight loss, unexplained changes in bowel or bladder problems, unexplained stumbling or dropping things, osteoporosis, and spinal surgery  SUBJECTIVE:  SUBJECTIVE STATEMENT: Patient states her R shoulder is feeling well and she was sore in the muscles around both shoulders after last PT session. She did some wall stretches and with hand behind her back since last PT  session   PAIN:  NPRS: 0/10   PATIENT GOALS: "to be able to do without the hurting and not damaging to the point I would need surgery or anything like that"  NEXT MD VISIT: no follow up for this specific problem  OBJECTIVE (measurement last taken 04/30/2023 unless otherwise noted)  SELF-REPORTED FUNCTION FOTO score: 74/100 (shoulder questionnaire)   PERIPHERAL JOINT MOTION (in degrees) ACTIVE RANGE OF MOTION (AROM) *Indicates pain 02/01/23 03/19/23 04/30/23 05/09/23  Joint/Motion R/L R/L R/L   Shoulder         Flexion 134*/176 138*/ 148*/ 150/  Extension 60*/70 61*/ 50/   Abduction  90*/180 147*/ 155*/ /165  External rotation 85*/119 90*/ 85*/   Internal rotation L5*/T3 L2*/ T11*/   Comments:  02/01/2023: B elbows, wrists, and hands grossly [facility] for basic activities without concordant or any pain.    MUSCLE PERFORMANCE (MMT):  *Indicates pain 02/01/23 03/19/23 04/30/23  Joint/Motion R/L R/L R/L  Shoulder        Flexion 4+*/5 4+/5 4+*/5  Abduction (C5) 4+*/5 4+*/5 4+/5  External rotation 4*/4 4+/4 4+/4+  Internal rotation 5*/5 5/5 5/5  Elbow        Flexion (C6) 5/5 / /  Extension (C7) 4+/5 / /  Wrist        Flexion (C7) 4/5 / /  Extension (C6) 4+/5 / /  Hand        Thumb extension (C8) B WFL / /  Finger abduction (T1) B WFL / /  Comments:   TODAY'S TREATMENT:  Therapeutic exercise: to centralize symptoms and improve ROM, strength, muscular endurance, and activity tolerance required for successful completion of functional activities.  - Upper body ergometer level 10 to encourage joint nutrition, warm tissue, induce analgesic effect of aerobic exercise, improve muscular strength and endurance,  and prepare for remainder of session. 5 min total changing directions ever 1 minute.  - seated/standing AROM B shoulder flexion/abduction between other techniques to assess response (pain less at posterior shoulder at end range flexion, but improved ROM)  (Manual therapy - see  below) - kneeling "prayer" stretch with PVC stick holding hands elbow width apart, 1x20 with 5 second holds.   Superset:  - standing face pull to over head press with 5# cable, 3x12 (cuing to keep ribs down and not compensate for lack of shoulder flexion with spine hyperextension).  - TRX rows 3x12 (good carry over).   Manual therapy: to reduce pain and tissue tension, improve range of motion, neuromodulation, in order to promote improved ability to complete functional activities. SUPINE/HOOKLYING - R GHJ posterior capsule AP mobs through humerus with R hand crossed over to left shoulder, 3x30 seconds, grade III-IV,  - R GHJ AP glide in IR, 3x30 seconds grade III-IV - R shoulder flexion PROM stretch with contract/relax, 6/6 seconds, 1x2 (stopped due to pain) - PROM flexion/scaption between other techniques to assess response (pain less at posterior shoulder at end range flexion, but improved ROM).   Pt required multimodal cuing for proper technique and to facilitate improved neuromuscular control, strength, range of motion, and functional ability resulting in improved performance and form.  PATIENT EDUCATION: Education details: updates in HEP, increased visit frequency recommendation and further visits.  Education on HEP including handout. Person educated: Patient  Education method: Explanation, Demonstration, Tactile cues, Verbal cues, and Handouts Education comprehension: verbalized understanding, returned demonstration, and needs further education  HOME EXERCISE PROGRAM: Access Code: [email or web address] URL: [email or web address] Date: 04/04/2023 Prepared by: Norton Blizzard  Exercises - Standing Shoulder Abduction Slides at Wall  - 1 x daily - 2 sets - 10 reps - 4 seconds hold - Scaption Wall Slide with Towel  - 1 x daily - 2 sets - 10 reps - 4 seconds hold - Push-Up on Counter  - 1 x daily - 2 sets - 15 reps - Standing Single Arm Shoulder Abduction with Resistance  - 1 x daily - 2  sets - 15 reps - Prone Horizontal Abduction with Palms Down  - 3-4 x weekly - 3 sets - 15 reps - 1 minute rest - Prone Lower Trapezius Strengthening on Swiss Ball  - 3-4 x weekly - 3 sets - 15 reps - 1 minute rest between sets - Shoulder External Rotation in Abduction with Anchored Resistance  - 3-4 x weekly - 2-3 sets - 20 reps - 45 seconds rest - Shoulder Internal Rotation in Abduction with Resistance  - 3-4 x weekly - 2-3 sets - 20 reps - 45 seconds rest  HOME EXERCISE PROGRAM [[email or web address]] View at "[email or web address]" using code: [email or web address] PROM Shoulder Internal Rotation (IR) with Towel - Up Back -  Repeat 10 Repetitions, Hold 4 Seconds, Complete 2 Sets, Perform 1 Times a Day  [email or web address] View at "[email or web address]" using code: [id number] GHJ self mobilization Repeat 10 Times Hold 5 Seconds Complete 2 Sets Perform 1 Times a Day   ASSESSMENT:  CLINICAL IMPRESSION: Patient has attended 20 physical therapy sessions since starting current episode of care on 02/01/2023. Patient has demonstrated good participation in HEP and made steady progress towards goals. She is overall feeling a lot better and has met or progressed towards all of her goals including meeting her FOTO goal and estimating improvement in functional limitations at 85% at visit # 18. She continues to have AROM/PROM limitations with end range pain for overhead motions and slightly less strength with discomfort in right shoulder movements compared to right  She seems to be responding to more frequent manual interventions and would benefit from approx 2-3 more weeks of this to show improved stability of recovery  and reach full ROM before discharge. Patient would benefit from continued management of limiting condition by skilled physical therapist to address remaining impairments and functional limitations to work towards stated goals and return to PLOF or maximal functional independence.   OBJECTIVE IMPAIRMENTS: decreased activity  tolerance, decreased coordination, decreased endurance, decreased knowledge of condition, difficulty walking, decreased ROM, decreased strength, hypomobility, impaired perceived functional ability, increased muscle spasms, impaired flexibility, impaired UE functional use, improper body mechanics, and pain.   ACTIVITY LIMITATIONS: carrying, lifting, sleeping, bed mobility, bathing, dressing, reach over head, and caring for others  PARTICIPATION LIMITATIONS: meal prep, cleaning, laundry, community activity, occupation, yard work, and   difficulty working, using the Firefighter, reaching, dressing, pickleball, grooming, pushing, making the bed, housework, taking impact through the right UE, reaching (out, behind body, to back seat), sleeping  PERSONAL FACTORS: Age, Past/current experiences, Time since onset of injury/illness/exacerbation, and 3+ comorbidities:   HYPERCHOLESTEROLEMIA; ALLERGIC RHINITIS; PSORIASIS; URINARY INCONTINENCE, MIXED; Palpitations; Rectal pain; Verruca vulgaris; Atopic dermatitis; GAD (generalized anxiety disorder); Panic attack; Vasovagal syncope; Pulmonary nodule; Tachycardia; GERD (gastroesophageal reflux disease); Tingling in extremities; Acute diarrhea; Acute pain of right shoulder; and Acute pain of left knee on their problem list.  She  has a past medical history of Allergy, Anxiety, GERD (gastroesophageal reflux disease), Hyperlipidemia, and Migraines are also affecting patient's functional outcome.   REHAB POTENTIAL: Good  CLINICAL DECISION MAKING: Evolving/moderate complexity  EVALUATION COMPLEXITY: Moderate   GOALS: Goals reviewed with patient? No  SHORT TERM GOALS: Target date: 02/15/2023  Patient will be independent with initial home exercise program for self-management of symptoms. Baseline: Initial HEP provided at IE (02/01/23); Goal status: In-progress  LONG TERM GOALS: Target date: 04/26/2023. Target date for all unmet goals updated to 07/23/2023 on  04/30/2023.   Patient will be independent with a long-term home exercise program for self-management of symptoms.  Baseline: Initial HEP provided at IE (02/01/23); currently participating well (03/19/2023; 04/30/23);  Goal status: In-progress  2.  Patient will demonstrate improved FOTO to equal or greater than 68 by visit #10 to demonstrate improvement in overall condition and self-reported functional ability.  Baseline: 61 (02/01/23); 62 at visit #5 (02/21/2023); 71 at visit #10 (03/19/2023); 74 at visit #20 (05/01/2023);  Goal status: MET  3.  Patient will demonstrate R shoulder AROM equal or greater than L shoulder AROM to improve her ability to complete daily activities such as working, dressing, and playing pickleball.  Baseline: significant limitations - see objective (02/01/23); improved in mots motions but not met (04/30/2023);  Goal status: In-progress  4.  Patient will demonstrate R shoulder MMT equal or greater than L shoulder MMT with no increase in concordant pain to improve her ability to complete valued activities such as dressing, reaching, pushing, working, and pickleball.  Baseline: significant limitations (02/01/23); improved but not met (04/30/2023) Goal status: In-progress  5.  Patient will complete community, work and/or recreational activities with 75% less limitation due to current condition.  Baseline: difficulty working, using the computer mouse, reaching, dressing, pickleball, grooming, pushing, making the bed, housework, taking impact through the right UE, reaching (out, behind body, to back seat), sleeping (02/01/23); estimates 75% improvement, but there are still things like reaching behind her back that is not back to where she was (03/19/2023);  estimates 80% improvement (04/30/2023);  Goal status: MET  PLAN:  PT FREQUENCY: 1-2x/week  PT DURATION: 12 weeks  PLANNED INTERVENTIONS: Therapeutic exercises, Therapeutic activity, Neuromuscular re-education,  Patient/Family education, Self Care, Joint mobilization, Dry Needling, Electrical stimulation, Spinal mobilization, Cryotherapy, Moist heat, Manual therapy, and Re-evaluation  PLAN FOR NEXT SESSION: update HEP as appropriate, progressive shoulder girdle/UE/postural/functional strengthening and ROM exercises as tolerated, manual therapy and dry needling as needed. Education.   Consider the following:  Continue with joint mobilizations and addressing subscapularis trigger points.  Mobilization with movement for overhead movement with potential to add to HEP.    Luretha Murphy. Ilsa Iha, PT, DPT 05/09/23, 7:44 PM  [facility] [address] [place], [place] [address] P: [phone] I F: [phone]

## 2023-05-14 ENCOUNTER — Ambulatory Visit: Payer: 59 | Attending: Family Medicine

## 2023-05-14 ENCOUNTER — Encounter: Payer: Self-pay | Admitting: Physical Therapy

## 2023-05-14 DIAGNOSIS — M79621 Pain in right upper arm: Secondary | ICD-10-CM | POA: Diagnosis not present

## 2023-05-14 DIAGNOSIS — M6281 Muscle weakness (generalized): Secondary | ICD-10-CM | POA: Diagnosis not present

## 2023-05-14 DIAGNOSIS — M25611 Stiffness of right shoulder, not elsewhere classified: Secondary | ICD-10-CM | POA: Diagnosis not present

## 2023-05-14 DIAGNOSIS — G8929 Other chronic pain: Secondary | ICD-10-CM | POA: Insufficient documentation

## 2023-05-14 DIAGNOSIS — M25511 Pain in right shoulder: Secondary | ICD-10-CM | POA: Diagnosis not present

## 2023-05-14 NOTE — Therapy (Addendum)
OUTPATIENT PHYSICAL THERAPY TREATMENT    Patient Name: Joyce Brock MRN: [id number] DOB:1966/02/24, 57 y.o., female Today's Date: 05/14/2023  END OF SESSION:  PT End of Session - 05/14/23 1641     Visit Number 21    Number of Visits 25    Date for PT Re-Evaluation 07/23/23    Authorization Type [place] [employer] SAVE reporting period from 03/19/2023    Authorization Time Period *only 4 modalities per visit*  VL 25 PT/OT combined  medial review after 25th visit    Authorization - Visit Number 21    Authorization - Number of Visits 25    Progress Note Due on Visit 30    PT Start Time 1518    PT Stop Time 1600    PT Time Calculation (min) 42 min    Activity Tolerance Patient tolerated treatment well;No increased pain    Behavior During Therapy WFL for tasks assessed/performed                 Past Medical History:  Diagnosis Date   Allergy    Anxiety    GERD (gastroesophageal reflux disease)    with certain foods/take OTC PRN meds   Hyperlipidemia    Migraines    Past Surgical History:  Procedure Laterality Date   CHOLECYSTECTOMY     COLONOSCOPY  2019   MS-MAC-suprep (good after lavage)-TA x 3/hems/tics   Patient Active Problem List   Diagnosis Date Noted   GERD (gastroesophageal reflux disease) 01/16/2020   Tachycardia    Pulmonary nodule    GAD (generalized anxiety disorder) 10/04/2018   Panic attack 10/04/2018   Atopic dermatitis 01/18/2017   Verruca vulgaris 11/15/2012   HYPERCHOLESTEROLEMIA 02/05/2009   Allergic rhinitis 10/30/2008   PSORIASIS 10/30/2008   URINARY INCONTINENCE, MIXED 10/30/2008    PCP: Excell Seltzer, MD  REFERRING PROVIDER: Excell Seltzer, MD  REFERRING DIAG: acute pain of right shoulder  THERAPY DIAG:  Chronic right shoulder pain  Pain in right upper arm  Stiffness of right shoulder, not elsewhere classified  Muscle weakness (generalized)  Rationale for Evaluation and Treatment: Rehabilitation  ONSET DATE: 6  months or more from PT evaluation  PERTINENT HISTORY: Patient is a 57 y.o. female who presents to outpatient physical therapy with a referral for medical diagnosis acute pain of right shoulder. This patient's chief complaints consist of pain over the right posterior arm and lateral arm, difficulty using right arm, leading to the following functional deficits: difficulty working, using the computer mouse, reaching, dressing, pickleball, grooming, pushing, making the bed, housework, taking impact through the right UE, reaching (out, behind body, to back seat), sleeping. Relevant past medical history and comorbidities include has HYPERCHOLESTEROLEMIA; ALLERGIC RHINITIS; PSORIASIS; URINARY INCONTINENCE, MIXED; Palpitations; Rectal pain; Verruca vulgaris; Atopic dermatitis; GAD (generalized anxiety disorder); Panic attack; Vasovagal syncope; Pulmonary nodule; Tachycardia; GERD (gastroesophageal reflux disease); Tingling in extremities; Acute diarrhea; Acute pain of right shoulder; and Acute pain of left knee on their problem list. She  has a past medical history of Allergy, Anxiety, GERD (gastroesophageal reflux disease), Hyperlipidemia, and Migraines. She has a past surgical history that includes Cholecystectomy. Patient denies hx of cancer, stroke, seizures, lung problems, heart problems, diabetes, unexplained weight loss, unexplained changes in bowel or bladder problems, unexplained stumbling or dropping things, osteoporosis, and spinal surgery  SUBJECTIVE:  SUBJECTIVE STATEMENT: Patient reports some increased soreness since she has been decorating a lot, but she is not in pain today.  She expressed that she would like her HEP to be "simpler" with just the essential exercises to help with her consistency.  PAIN:  NPRS: 0/10    PATIENT GOALS: "to be able to do without the hurting and not damaging to the point I would need surgery or anything like that"  NEXT MD VISIT: no follow up for this specific problem  OBJECTIVE   TODAY'S TREATMENT:   GHJ AP glide 4x40 seconds Manual  Right shoulder flexion contract-relax 5 x contract relax Manual  Abduction wall slides 2x10  Therex  Flexion wall slide 2x10  Therex  Front raises x12, 2x10 (4#) Therex  Lateral raises x12 with 5 lbs, x5 with 5#, 5 with 4#,  x12 with 4# Therex  Seated shoulder ER 0-90 in 70 degrees flexion 5#; 3x10 Therex  Shoulder press 2x10 with 5 lbs Therex  TRX Rows x12 Therex   Pt required multimodal cuing for proper technique and to facilitate improved neuromuscular control, strength, range of motion, and functional ability resulting in improved performance and form.  PATIENT EDUCATION: Education details: updates in HEP, increased visit frequency recommendation and further visits.  Education on HEP including handout. Person educated: Patient Education method: Explanation, Demonstration, Tactile cues, Verbal cues, and Handouts Education comprehension: verbalized understanding, returned demonstration, and needs further education  HOME EXERCISE PROGRAM: Access Code: [email or web address] URL: [email or web address] Date: 05/14/2023 Prepared by: Alvera Novel  Exercises - Push-Up on Counter  - 1 x daily - 2 sets - 15 reps - Standing Single Arm Shoulder Abduction with Resistance  - 1 x daily - 2 sets - 15 reps - Prone Horizontal Abduction with Palms Down  - 3-4 x weekly - 3 sets - 15 reps - 1 minute rest - Prone Lower Trapezius Strengthening on Swiss Ball  - 3-4 x weekly - 3 sets - 15 reps - 1 minute rest between sets - Shoulder External Rotation in Abduction with Anchored Resistance  - 3-4 x weekly - 2-3 sets - 20 reps - 45 seconds rest - Shoulder Internal Rotation in Abduction with Resistance  - 3-4 x weekly - 2-3 sets - 20 reps - 45 seconds  rest  HOME EXERCISE PROGRAM [[email or web address]] View at "[email or web address]" using code: [email or web address] PROM Shoulder Internal Rotation (IR) with Towel - Up Back -  Repeat 10 Repetitions, Hold 4 Seconds, Complete 2 Sets, Perform 1 Times a Day  [email or web address] View at "[email or web address]" using code: [id number] GHJ self mobilization Repeat 10 Times Hold 5 Seconds Complete 2 Sets Perform 1 Times a Day   ASSESSMENT:  CLINICAL IMPRESSION: Patient tolerated session well.  Her AROM and PROM were improved after GH A-P mob and contract-relax stretching.  Front raises and OH presses were added to continue working on strength, especially at end-range.  Patient experienced decreased endurance with these exercises, but pain stayed within tolerable limits. Patient would benefit from continued management of limiting condition by skilled physical therapist to address remaining impairments and functional limitations to work towards stated goals and return to PLOF or maximal functional independence.    OBJECTIVE IMPAIRMENTS: decreased activity tolerance, decreased coordination, decreased endurance, decreased knowledge of condition, difficulty walking, decreased ROM, decreased strength, hypomobility, impaired perceived functional ability, increased muscle spasms, impaired flexibility, impaired UE functional use, improper body mechanics, and pain.   ACTIVITY LIMITATIONS: carrying, lifting, sleeping, bed mobility, bathing, dressing, reach over head, and caring for others  PARTICIPATION LIMITATIONS: meal prep, cleaning, laundry, community activity, occupation, yard work, and   difficulty working, using the Firefighter, reaching, dressing, pickleball, grooming, pushing, making the bed, housework, taking impact through the right UE, reaching (out, behind body, to back seat), sleeping  PERSONAL FACTORS: Age, Past/current experiences, Time since onset of injury/illness/exacerbation, and 3+ comorbidities:   HYPERCHOLESTEROLEMIA;  ALLERGIC RHINITIS; PSORIASIS; URINARY INCONTINENCE, MIXED; Palpitations; Rectal pain; Verruca vulgaris; Atopic dermatitis; GAD (generalized anxiety disorder); Panic attack; Vasovagal syncope; Pulmonary nodule; Tachycardia; GERD (gastroesophageal reflux disease); Tingling in extremities; Acute diarrhea; Acute pain of right shoulder; and Acute pain of left knee on their problem list. She  has a past medical history of Allergy, Anxiety, GERD (gastroesophageal reflux disease), Hyperlipidemia, and Migraines are also affecting patient's functional outcome.   REHAB POTENTIAL: Good  CLINICAL DECISION MAKING: Evolving/moderate complexity  EVALUATION COMPLEXITY: Moderate   GOALS: Goals reviewed with patient? No  SHORT TERM GOALS: Target date: 02/15/2023  Patient will be independent with initial home exercise program for self-management of symptoms. Baseline: Initial HEP provided at IE (02/01/23); Goal status: In-progress  LONG TERM GOALS: Target date: 04/26/2023. Target date for all unmet goals updated to 07/23/2023 on 04/30/2023.   Patient will be independent with a long-term home exercise program for self-management of symptoms.  Baseline: Initial HEP provided at IE (02/01/23); currently participating well (03/19/2023; 04/30/23);  Goal status: In-progress  2.  Patient will demonstrate improved FOTO to equal or greater than 68 by visit #10 to demonstrate improvement in overall condition and self-reported functional ability.  Baseline: 61 (02/01/23); 62 at visit #5 (02/21/2023); 71 at visit #10 (03/19/2023); 74 at visit #20 (05/01/2023);  Goal status: MET  3.  Patient will demonstrate R shoulder AROM equal or greater than L shoulder AROM to improve her ability to complete daily activities such as working, dressing, and playing pickleball.  Baseline: significant limitations - see objective (02/01/23); improved in mots motions but not met (04/30/2023);  Goal status: In-progress  4.  Patient will  demonstrate R shoulder MMT equal or greater than L shoulder MMT with no increase in concordant pain to improve her ability to complete valued activities such as dressing, reaching, pushing, working, and pickleball.  Baseline: significant limitations (02/01/23); improved but not met (04/30/2023) Goal status: In-progress  5.  Patient will complete community, work and/or recreational activities with 75% less limitation due to current condition.  Baseline: difficulty working, using the computer mouse, reaching, dressing, pickleball, grooming, pushing, making the bed, housework, taking impact through the right UE, reaching (out, behind body, to back seat), sleeping (02/01/23); estimates 75% improvement, but there are still things like reaching behind her back that is not back to where she was (03/19/2023);  estimates 80% improvement (04/30/2023);  Goal status: MET  PLAN:  PT FREQUENCY: 1-2x/week  PT DURATION: 12 weeks  PLANNED INTERVENTIONS: Therapeutic exercises, Therapeutic activity, Neuromuscular re-education, Patient/Family education, Self Care, Joint mobilization, Dry Needling, Electrical stimulation, Spinal mobilization, Cryotherapy, Moist heat, Manual therapy, and Re-evaluation  PLAN FOR NEXT SESSION: update HEP as appropriate, progressive shoulder girdle/UE/postural/functional strengthening and ROM exercises as tolerated, manual therapy and dry needling as needed. Education.   Consider the following:  Continue with joint mobilizations and addressing subscapularis trigger points.  Mobilization with movement for overhead movement with potential to add to HEP.    Matthew Saras, Student-PT    6:30 PM, 05/14/23 Rosamaria Lints, PT, DPT Physical Therapist - [place] [phone] (Office)

## 2023-05-16 ENCOUNTER — Ambulatory Visit: Payer: 59 | Admitting: Physical Therapy

## 2023-05-16 ENCOUNTER — Encounter: Payer: Self-pay | Admitting: Physical Therapy

## 2023-05-16 DIAGNOSIS — M25611 Stiffness of right shoulder, not elsewhere classified: Secondary | ICD-10-CM | POA: Diagnosis not present

## 2023-05-16 DIAGNOSIS — M25511 Pain in right shoulder: Secondary | ICD-10-CM | POA: Diagnosis not present

## 2023-05-16 DIAGNOSIS — G8929 Other chronic pain: Secondary | ICD-10-CM

## 2023-05-16 DIAGNOSIS — M79621 Pain in right upper arm: Secondary | ICD-10-CM | POA: Diagnosis not present

## 2023-05-16 DIAGNOSIS — M6281 Muscle weakness (generalized): Secondary | ICD-10-CM | POA: Diagnosis not present

## 2023-05-16 DIAGNOSIS — J301 Allergic rhinitis due to pollen: Secondary | ICD-10-CM | POA: Diagnosis not present

## 2023-05-16 NOTE — Therapy (Cosign Needed Addendum)
OUTPATIENT PHYSICAL THERAPY TREATMENT    Patient Name: Joyce Brock MRN: [id number] DOB:Nov 12, 1965, 57 y.o., female Today's Date: 05/16/2023  END OF SESSION:  PT End of Session - 05/16/23 1024     Visit Number 22    Number of Visits 25    Date for PT Re-Evaluation 07/23/23    Authorization Type [place] [employer] SAVE reporting period from 03/19/2023    Authorization Time Period *only 4 modalities per visit*  VL 25 PT/OT combined  medial review after 25th visit    Authorization - Visit Number 22    Authorization - Number of Visits 25    Progress Note Due on Visit 30    PT Start Time 0903    PT Stop Time 0948    PT Time Calculation (min) 45 min    Activity Tolerance Patient tolerated treatment well;No increased pain    Behavior During Therapy WFL for tasks assessed/performed               Past Medical History:  Diagnosis Date   Allergy    Anxiety    GERD (gastroesophageal reflux disease)    with certain foods/take OTC PRN meds   Hyperlipidemia    Migraines    Past Surgical History:  Procedure Laterality Date   CHOLECYSTECTOMY     COLONOSCOPY  2019   MS-MAC-suprep (good after lavage)-TA x 3/hems/tics   Patient Active Problem List   Diagnosis Date Noted   GERD (gastroesophageal reflux disease) 01/16/2020   Tachycardia    Pulmonary nodule    GAD (generalized anxiety disorder) 10/04/2018   Panic attack 10/04/2018   Atopic dermatitis 01/18/2017   Verruca vulgaris 11/15/2012   HYPERCHOLESTEROLEMIA 02/05/2009   Allergic rhinitis 10/30/2008   PSORIASIS 10/30/2008   URINARY INCONTINENCE, MIXED 10/30/2008    PCP: Excell Seltzer, MD  REFERRING PROVIDER: Excell Seltzer, MD  REFERRING DIAG: acute pain of right shoulder  THERAPY DIAG:  Chronic right shoulder pain  Pain in right upper arm  Stiffness of right shoulder, not elsewhere classified  Muscle weakness (generalized)  Rationale for Evaluation and Treatment: Rehabilitation  ONSET DATE: 6 months  or more from PT evaluation  PERTINENT HISTORY: Patient is a 57 y.o. female who presents to outpatient physical therapy with a referral for medical diagnosis acute pain of right shoulder. This patient's chief complaints consist of pain over the right posterior arm and lateral arm, difficulty using right arm, leading to the following functional deficits: difficulty working, using the computer mouse, reaching, dressing, pickleball, grooming, pushing, making the bed, housework, taking impact through the right UE, reaching (out, behind body, to back seat), sleeping. Relevant past medical history and comorbidities include has HYPERCHOLESTEROLEMIA; ALLERGIC RHINITIS; PSORIASIS; URINARY INCONTINENCE, MIXED; Palpitations; Rectal pain; Verruca vulgaris; Atopic dermatitis; GAD (generalized anxiety disorder); Panic attack; Vasovagal syncope; Pulmonary nodule; Tachycardia; GERD (gastroesophageal reflux disease); Tingling in extremities; Acute diarrhea; Acute pain of right shoulder; and Acute pain of left knee on their problem list. She  has a past medical history of Allergy, Anxiety, GERD (gastroesophageal reflux disease), Hyperlipidemia, and Migraines. She has a past surgical history that includes Cholecystectomy. Patient denies hx of cancer, stroke, seizures, lung problems, heart problems, diabetes, unexplained weight loss, unexplained changes in bowel or bladder problems, unexplained stumbling or dropping things, osteoporosis, and spinal surgery  SUBJECTIVE:  SUBJECTIVE STATEMENT: Patient reports general muscle soreness following last session, but not in her normal spot.  She expressed that she would like her HEP to be "simpler" with just the essential exercises to help with her consistency.   PAIN:  NPRS: 0/10   PATIENT GOALS: "to be  able to do without the hurting and not damaging to the point I would need surgery or anything like that"  NEXT MD VISIT: no follow up for this specific problem  OBJECTIVE   TODAY'S TREATMENT:   UBE 5 minutes Level 10: 2:30 minutes each direction therex  Supine right GHJ AP glide 4x40 seconds manual  Supine right shoulder flexion contract-relax 6 x contract relax manual  hooklying Straight arm pullover with sled bar x10 therex  Seated Front raises 4# x12, 2x8 (better range compared to last time) therex  Lateral raises 5#: x8, x8, x10 therex  Shoulder press x10, x8, x10 with 5# therex   Pt required multimodal cuing for proper technique and to facilitate improved neuromuscular control, strength, range of motion, and functional ability resulting in improved performance and form.  PATIENT EDUCATION: Education details: updates in HEP, increased visit frequency recommendation and further visits.  Education on HEP including handout. Person educated: Patient Education method: Explanation, Demonstration, Tactile cues, Verbal cues, and Handouts Education comprehension: verbalized understanding, returned demonstration, and needs further education  HOME EXERCISE PROGRAM:  [email or web address] View at "[email or web address]" using code: [id number]  Shoulder Flexion Stretch w/ Foam Roller -  Repeat 10 Repetitions, Hold 10 Seconds, Complete 2 Sets, Perform 1 Times a Day  DUMBBELL - FRONT RAISE - SEATED - 4# -  Repeat 8 Repetitions, Hold 1 Second(s), Complete 3 Sets, Perform 3 Times a Week  DUMBBELLS - LATERAL RAISE (4#) -  Repeat 8 Repetitions, Hold 1 Second(s), Complete 3 Sets, Perform 3 Times a Week  Straight Arm Row - W's with elastic band (Face Pulls) -  Repeat 10 Repetitions, Hold 2 Seconds, Complete 3 Sets, Perform 3 Times a Week  DUMBBELL SHOULDER PRESS - 5# -  Repeat 8 Repetitions, Hold 1 Second(s), Complete 3 Sets, Perform 3 Times a Week   ASSESSMENT:  CLINICAL IMPRESSION: Patient tolerated  session well. Her AROM and PROM continued to improve after GH A-P mob and contract-relax stretching.  Front raises and lateral raises were progressed to utilizing her full ROM while slightly decreasing reps.  Patient will continue to benefit from end range flexion and abduction strengthening and progressing to more global UE strengthening exercises. Patient would benefit from continued management of limiting condition by skilled physical therapist to address remaining impairments and functional limitations to work towards stated goals and return to PLOF or maximal functional independence.    OBJECTIVE IMPAIRMENTS: decreased activity tolerance, decreased coordination, decreased endurance, decreased knowledge of condition, difficulty walking, decreased ROM, decreased strength, hypomobility, impaired perceived functional ability, increased muscle spasms, impaired flexibility, impaired UE functional use, improper body mechanics, and pain.   ACTIVITY LIMITATIONS: carrying, lifting, sleeping, bed mobility, bathing, dressing, reach over head, and caring for others  PARTICIPATION LIMITATIONS: meal prep, cleaning, laundry, community activity, occupation, yard work, and   difficulty working, using the Firefighter, reaching, dressing, pickleball, grooming, pushing, making the bed, housework, taking impact through the right UE, reaching (out, behind body, to back seat), sleeping  PERSONAL FACTORS: Age, Past/current experiences, Time since onset of injury/illness/exacerbation, and 3+ comorbidities:   HYPERCHOLESTEROLEMIA; ALLERGIC RHINITIS; PSORIASIS; URINARY INCONTINENCE, MIXED; Palpitations; Rectal pain; Verruca vulgaris; Atopic dermatitis; GAD (generalized anxiety disorder); Panic attack; Vasovagal  syncope; Pulmonary nodule; Tachycardia; GERD (gastroesophageal reflux disease); Tingling in extremities; Acute diarrhea; Acute pain of right shoulder; and Acute pain of left knee on their problem list. She  has a past  medical history of Allergy, Anxiety, GERD (gastroesophageal reflux disease), Hyperlipidemia, and Migraines are also affecting patient's functional outcome.   REHAB POTENTIAL: Good  CLINICAL DECISION MAKING: Evolving/moderate complexity  EVALUATION COMPLEXITY: Moderate   GOALS: Goals reviewed with patient? No  SHORT TERM GOALS: Target date: 02/15/2023  Patient will be independent with initial home exercise program for self-management of symptoms. Baseline: Initial HEP provided at IE (02/01/23); Goal status: In-progress  LONG TERM GOALS: Target date: 04/26/2023. Target date for all unmet goals updated to 07/23/2023 on 04/30/2023.   Patient will be independent with a long-term home exercise program for self-management of symptoms.  Baseline: Initial HEP provided at IE (02/01/23); currently participating well (03/19/2023; 04/30/23);  Goal status: In-progress  2.  Patient will demonstrate improved FOTO to equal or greater than 68 by visit #10 to demonstrate improvement in overall condition and self-reported functional ability.  Baseline: 61 (02/01/23); 62 at visit #5 (02/21/2023); 71 at visit #10 (03/19/2023); 74 at visit #20 (05/01/2023);  Goal status: MET  3.  Patient will demonstrate R shoulder AROM equal or greater than L shoulder AROM to improve her ability to complete daily activities such as working, dressing, and playing pickleball.  Baseline: significant limitations - see objective (02/01/23); improved in mots motions but not met (04/30/2023);  Goal status: In-progress  4.  Patient will demonstrate R shoulder MMT equal or greater than L shoulder MMT with no increase in concordant pain to improve her ability to complete valued activities such as dressing, reaching, pushing, working, and pickleball.  Baseline: significant limitations (02/01/23); improved but not met (04/30/2023) Goal status: In-progress  5.  Patient will complete community, work and/or recreational activities with 75%  less limitation due to current condition.  Baseline: difficulty working, using the computer mouse, reaching, dressing, pickleball, grooming, pushing, making the bed, housework, taking impact through the right UE, reaching (out, behind body, to back seat), sleeping (02/01/23); estimates 75% improvement, but there are still things like reaching behind her back that is not back to where she was (03/19/2023);  estimates 80% improvement (04/30/2023);  Goal status: MET  PLAN:  PT FREQUENCY: 1-2x/week  PT DURATION: 12 weeks  PLANNED INTERVENTIONS: Therapeutic exercises, Therapeutic activity, Neuromuscular re-education, Patient/Family education, Self Care, Joint mobilization, Dry Needling, Electrical stimulation, Spinal mobilization, Cryotherapy, Moist heat, Manual therapy, and Re-evaluation  PLAN FOR NEXT SESSION: update HEP as appropriate, progressive shoulder girdle/UE/postural/functional strengthening and ROM exercises as tolerated, manual therapy and dry needling as needed. Education.   Consider the following:  Continue with joint mobilizations and addressing subscapularis trigger points.  Mobilization with movement for overhead movement with potential to add to HEP.    Caryl Manas, [place], Student-PT    [place]. Ilsa Iha, PT, DPT 05/16/23, 1:24 PM  [facility] [address] [place], [place] [address] P: [phone] I F: [phone]

## 2023-05-17 ENCOUNTER — Telehealth: Payer: Self-pay | Admitting: Family Medicine

## 2023-05-17 ENCOUNTER — Ambulatory Visit: Payer: 59 | Admitting: Clinical

## 2023-05-17 DIAGNOSIS — F411 Generalized anxiety disorder: Secondary | ICD-10-CM | POA: Diagnosis not present

## 2023-05-17 NOTE — Progress Notes (Signed)
[facility] Counselor/Therapist Progress Note  Patient ID: Joyce Brock, MRN: [id number],    Date: 05/17/2023  Time Spent: 10:36am - 11:28am : 52 minutes   Treatment Type: Individual Therapy  Reported Symptoms: none reported   Mental Status Exam: Appearance:  Neat and Well Groomed     Behavior: Appropriate  Motor: Normal  Speech/Language:  Clear and Coherent  Affect: Appropriate  Mood: normal  Thought process: normal  Thought content:   WNL  Sensory/Perceptual disturbances:   WNL  Orientation: oriented to person, place, and situation  Attention: Good  Concentration: Good  Memory: WNL  Fund of knowledge:  Good  Insight:   Good  Judgment:  Good  Impulse Control: Good   Risk Assessment: Danger to Self:  No Patient denied current suicidal ideation  Self-injurious Behavior: No Danger to Others: No Patient denied current homicidal ideation Duty to Warn:no Physical Aggression / Violence:No  Access to Firearms a concern: No  Gang Involvement:No   Subjective: Patient stated, "pretty good" in response to events since last session.  Patient reported her mother in law arrived with the food she prepared for thanksgiving when everyone else arrive and patient reported feeling overwhelmed in response. Patient stated, "I did voice my thought of I thought you (mother in law) were going to be here earlier". Patient stated, "in my mind she (mother in law) was going to be there earlier", "she did not give me a time but in my mind she was going to be there earlier". Patient stated, "I feel like I recovered pretty good with it". Patient stated, "I've been trying to go to my [employer] but I have not made it there yet". Patient reported feeling "a little overwhelmed" and reported feeling she has "unrealistic expectations" in regard to holiday preparation. Patient stated, "trying to find that balance in wanting Christmas to be all of that with the traditions but not getting  overwhelmed by it". Patient stated, "I don't know why I feel so far behind and its only December 1st". Patient reported she feels overwhelmed during this time of year. Patient stated, "I love that idea, ill try that" in response to incorporating sentimental ornaments on current tree. Patient stated, "I do need to go to my [employer]". Patient stated,  "I feel good about it" in response to patient's progress in therapy. Patient stated, "good" in response to patient's current mood.  Interventions: Cognitive Behavioral Therapy. Clinician conducted session in person at clinician's office at [facility]. Reviewed events since last session. Discussed recent stressors and explored patient's thoughts and feelings in response. Assisted patient in exploring and identifying triggers for feeling overwhelmed. Discussed coping strategies to utilize in response to feeling overwhelmed, such as, making list of tasks to complete, prioritizing list, delegating tasks, asking others for support, incorporating sentimental ornaments on current tree instead of decorating three trees. Discussed patient's use of mindfulness exercises. Explored and identified barriers to practicing mindfulness exercises. Discussed strategies to increase patient's use of mindfulness exercises, such as, incorporating mindfulness into patient's daily routine. Discussed patient's progress in treatment. Assessed patient's mood.    Collaboration of Care: Other not required at this time   Diagnosis:  Generalized anxiety disorder     Plan: Patient is to utilize [employer], thought re-framing, relaxation techniques, mindfulness and coping strategies to decrease symptoms associated with Generalized Anxiety Disorder. Frequency: monthly  Modality: individual      Long-term goal:   Reduce overall level, frequency, and intensity of the  feelings of anxiety and panic as evidenced by decrease in panic attacks, anxiety, negative  thoughts, feeling defensive, difficulty focusing, feeling on edge, lack of motivation, and feeling unproductive from 3 to 5 days/week to 0 to 1 days/week per patient report for at least 3 consecutive months. Target Date: 09/07/23  Progress: progressing    Short-term goal:  Reduce overall level, frequency, and intensity of feeling overwhelmed, frustration, and irritability in response to the occurrence of unplanned events  Target Date: 03/10/23  Progress: patient reported she feels this goal has been met    Identify triggers for feeling overwhelmed, frustrated, and irritable when unplanned events occur and develop/implement strategies to process thoughts/feelings prior to responding.  Target Date: 03/10/23  Progress: patient reported she feels this goal has been met    Develop effective communication strategies for patient to utilize when expressing her thoughts and feelings to others in a healthy, controlled and assertive way  Target Date: 09/07/23  Progress: progressing    Verbally express an understanding of the relationship between feelings of anxiety and the impact on thinking patterns and behaviors.  Target Date: 03/10/23  Progress: patient reported she feels this goal has been met    Identify, challenge, and replace negative thought patterns that contribute to feelings of  anxiety with positive thoughts and beliefs per patient's report  Target Date: 03/10/23  Progress: patient reported she feels this goal has been met    Doree Barthel, LCSW

## 2023-05-17 NOTE — Progress Notes (Signed)
                Dezi Schaner, LCSW 

## 2023-05-17 NOTE — Telephone Encounter (Signed)
error 

## 2023-05-22 ENCOUNTER — Encounter: Payer: Self-pay | Admitting: Physical Therapy

## 2023-05-22 ENCOUNTER — Ambulatory Visit: Payer: 59 | Admitting: Physical Therapy

## 2023-05-22 DIAGNOSIS — M25611 Stiffness of right shoulder, not elsewhere classified: Secondary | ICD-10-CM | POA: Diagnosis not present

## 2023-05-22 DIAGNOSIS — M25511 Pain in right shoulder: Secondary | ICD-10-CM | POA: Diagnosis not present

## 2023-05-22 DIAGNOSIS — M6281 Muscle weakness (generalized): Secondary | ICD-10-CM | POA: Diagnosis not present

## 2023-05-22 DIAGNOSIS — M79621 Pain in right upper arm: Secondary | ICD-10-CM

## 2023-05-22 DIAGNOSIS — G8929 Other chronic pain: Secondary | ICD-10-CM

## 2023-05-22 NOTE — Therapy (Addendum)
OUTPATIENT PHYSICAL THERAPY TREATMENT    Patient Name: Joyce Brock MRN: [id number] DOB:11-Nov-1965, 57 y.o., female Today's Date: 05/22/2023  END OF SESSION:  PT End of Session - 05/22/23 2013     Visit Number 23    Number of Visits 25    Date for PT Re-Evaluation 07/23/23    Authorization Type [place] [employer] SAVE reporting period from 03/19/2023    Authorization Time Period *only 4 modalities per visit*  VL 25 PT/OT combined  medial review after 25th visit    Authorization - Number of Visits 25    Progress Note Due on Visit 30    PT Start Time 1520    PT Stop Time 1600    PT Time Calculation (min) 40 min    Activity Tolerance Patient tolerated treatment well;No increased pain    Behavior During Therapy WFL for tasks assessed/performed                Past Medical History:  Diagnosis Date   Allergy    Anxiety    GERD (gastroesophageal reflux disease)    with certain foods/take OTC PRN meds   Hyperlipidemia    Migraines    Past Surgical History:  Procedure Laterality Date   CHOLECYSTECTOMY     COLONOSCOPY  2019   MS-MAC-suprep (good after lavage)-TA x 3/hems/tics   Patient Active Problem List   Diagnosis Date Noted   GERD (gastroesophageal reflux disease) 01/16/2020   Tachycardia    Pulmonary nodule    GAD (generalized anxiety disorder) 10/04/2018   Panic attack 10/04/2018   Atopic dermatitis 01/18/2017   Verruca vulgaris 11/15/2012   HYPERCHOLESTEROLEMIA 02/05/2009   Allergic rhinitis 10/30/2008   PSORIASIS 10/30/2008   URINARY INCONTINENCE, MIXED 10/30/2008    PCP: Excell Seltzer, MD  REFERRING PROVIDER: Excell Seltzer, MD  REFERRING DIAG: acute pain of right shoulder  THERAPY DIAG:  Chronic right shoulder pain  Pain in right upper arm  Stiffness of right shoulder, not elsewhere classified  Muscle weakness (generalized)  Rationale for Evaluation and Treatment: Rehabilitation  ONSET DATE: 6 months or more from PT  evaluation  PERTINENT HISTORY: Patient is a 57 y.o. female who presents to outpatient physical therapy with a referral for medical diagnosis acute pain of right shoulder. This patient's chief complaints consist of pain over the right posterior arm and lateral arm, difficulty using right arm, leading to the following functional deficits: difficulty working, using the computer mouse, reaching, dressing, pickleball, grooming, pushing, making the bed, housework, taking impact through the right UE, reaching (out, behind body, to back seat), sleeping. Relevant past medical history and comorbidities include has HYPERCHOLESTEROLEMIA; ALLERGIC RHINITIS; PSORIASIS; URINARY INCONTINENCE, MIXED; Palpitations; Rectal pain; Verruca vulgaris; Atopic dermatitis; GAD (generalized anxiety disorder); Panic attack; Vasovagal syncope; Pulmonary nodule; Tachycardia; GERD (gastroesophageal reflux disease); Tingling in extremities; Acute diarrhea; Acute pain of right shoulder; and Acute pain of left knee on their problem list. She  has a past medical history of Allergy, Anxiety, GERD (gastroesophageal reflux disease), Hyperlipidemia, and Migraines. She has a past surgical history that includes Cholecystectomy. Patient denies hx of cancer, stroke, seizures, lung problems, heart problems, diabetes, unexplained weight loss, unexplained changes in bowel or bladder problems, unexplained stumbling or dropping things, osteoporosis, and spinal surgery  SUBJECTIVE:  SUBJECTIVE STATEMENT: Patient has been feeling good since last session. She thinks she slept on it weird on Sunday because it hurt when she woke up, but generally felt better after doing stretches.  She has been doing some of her HEP over the weekend.   PAIN:  NPRS: 0/10   PATIENT GOALS: "to be  able to do without the hurting and not damaging to the point I would need surgery or anything like that"  NEXT MD VISIT: no follow up for this specific problem  OBJECTIVE   TODAY'S TREATMENT:   Gross flexion and ABD ROM assessment as astricks test to assess response to manual therapy - therex  Therex UBE 6 minutes Level 10: 2:30 minutes each direction  Manual GHJ AP glide 2x40 seconds  Manual Right shoulder flexion contract-relax 6 x contract relax  Therex hooklying Straight arm pullover with sled barx10 BW, x15 with sled bar, x12 with 2 5 lbs weights  Theract Putting weights on shelf On higherst shelf she could reach: Set 1: put 5 5lbs weight plates on shelf and took them down (easy). Attempted to put 20 lb KB on shelf but caused "twinge" in shoulder (1 rep) 3 sets: put 15 lb  dumbbell on and off the shelf 12 times   therex Lat pulldown 2x15, 1x20 with 35 lbs   Therex: 15 minutes Theract: 10 minutes Manual: 15 minutes  Pt required multimodal cuing for proper technique and to facilitate improved neuromuscular control, strength, range of motion, and functional ability resulting in improved performance and form.  PATIENT EDUCATION: Education details: updates in HEP, increased visit frequency recommendation and further visits.  Person educated: Patient Education method: Explanation, Demonstration, Tactile cues, Verbal cues, and Handouts Education comprehension: verbalized understanding, returned demonstration, and needs further education  HOME EXERCISE PROGRAM:  HEP2go.com View at "my-exercise-code.com" using code: [id number]  Shoulder Flexion Stretch w/ Foam Roller -  Repeat 10 Repetitions, Hold 10 Seconds, Complete 2 Sets, Perform 1 Times a Day  DUMBBELL - FRONT RAISE - SEATED - 4# -  Repeat 8 Repetitions, Hold 1 Second(s), Complete 3 Sets, Perform 3 Times a Week  DUMBBELLS - LATERAL RAISE (4#) -  Repeat 8 Repetitions, Hold 1 Second(s), Complete 3 Sets, Perform 3 Times a  Week  Straight Arm Row - W's with elastic band (Face Pulls) -  Repeat 10 Repetitions, Hold 2 Seconds, Complete 3 Sets, Perform 3 Times a Week  DUMBBELL SHOULDER PRESS - 5# -  Repeat 8 Repetitions, Hold 1 Second(s), Complete 3 Sets, Perform 3 Times a Week   ASSESSMENT:  CLINICAL IMPRESSION: Patient tolerated session well. Her flexion and abduction ROM is improving and her pain is decreasing over time.  Contract-relax stretching was attempted before joint mobilizations this time to assess if patient could be weaned off from them.  She responded well to contract-relax stretching for right shoulder flexion, but still struggled to get to the same ROM as her left side.  A-P GH joint mobs were added back at a lower dosage, and patient had increased ROM and decreased pain, indicating that she still benefits from continued manual therapy at this time.  She was able to progress her supine lat pullovers from bilateral to unilateral dumbbells, indicating increased motor control at end range shoulder flexion.  Patient still struggles to consistently participate in home exercises, so patient education was given on importance of following her HEP. Patient would benefit from continued management of limiting condition by skilled physical therapist to address remaining impairments and functional  limitations to work towards stated goals and return to PLOF or maximal functional independence.     OBJECTIVE IMPAIRMENTS: decreased activity tolerance, decreased coordination, decreased endurance, decreased knowledge of condition, difficulty walking, decreased ROM, decreased strength, hypomobility, impaired perceived functional ability, increased muscle spasms, impaired flexibility, impaired UE functional use, improper body mechanics, and pain.   ACTIVITY LIMITATIONS: carrying, lifting, sleeping, bed mobility, bathing, dressing, reach over head, and caring for others  PARTICIPATION LIMITATIONS: meal prep, cleaning, laundry,  community activity, occupation, yard work, and   difficulty working, using the Firefighter, reaching, dressing, pickleball, grooming, pushing, making the bed, housework, taking impact through the right UE, reaching (out, behind body, to back seat), sleeping  PERSONAL FACTORS: Age, Past/current experiences, Time since onset of injury/illness/exacerbation, and 3+ comorbidities:   HYPERCHOLESTEROLEMIA; ALLERGIC RHINITIS; PSORIASIS; URINARY INCONTINENCE, MIXED; Palpitations; Rectal pain; Verruca vulgaris; Atopic dermatitis; GAD (generalized anxiety disorder); Panic attack; Vasovagal syncope; Pulmonary nodule; Tachycardia; GERD (gastroesophageal reflux disease); Tingling in extremities; Acute diarrhea; Acute pain of right shoulder; and Acute pain of left knee on their problem list. She  has a past medical history of Allergy, Anxiety, GERD (gastroesophageal reflux disease), Hyperlipidemia, and Migraines are also affecting patient's functional outcome.   REHAB POTENTIAL: Good  CLINICAL DECISION MAKING: Evolving/moderate complexity  EVALUATION COMPLEXITY: Moderate   GOALS: Goals reviewed with patient? No  SHORT TERM GOALS: Target date: 02/15/2023  Patient will be independent with initial home exercise program for self-management of symptoms. Baseline: Initial HEP provided at IE (02/01/23); Goal status: In-progress  LONG TERM GOALS: Target date: 04/26/2023. Target date for all unmet goals updated to 07/23/2023 on 04/30/2023.   Patient will be independent with a long-term home exercise program for self-management of symptoms.  Baseline: Initial HEP provided at IE (02/01/23); currently participating well (03/19/2023; 04/30/23);  Goal status: In-progress  2.  Patient will demonstrate improved FOTO to equal or greater than 68 by visit #10 to demonstrate improvement in overall condition and self-reported functional ability.  Baseline: 61 (02/01/23); 62 at visit #5 (02/21/2023); 71 at visit #10  (03/19/2023); 74 at visit #20 (05/01/2023);  Goal status: MET  3.  Patient will demonstrate R shoulder AROM equal or greater than L shoulder AROM to improve her ability to complete daily activities such as working, dressing, and playing pickleball.  Baseline: significant limitations - see objective (02/01/23); improved in mots motions but not met (04/30/2023);  Goal status: In-progress  4.  Patient will demonstrate R shoulder MMT equal or greater than L shoulder MMT with no increase in concordant pain to improve her ability to complete valued activities such as dressing, reaching, pushing, working, and pickleball.  Baseline: significant limitations (02/01/23); improved but not met (04/30/2023) Goal status: In-progress  5.  Patient will complete community, work and/or recreational activities with 75% less limitation due to current condition.  Baseline: difficulty working, using the computer mouse, reaching, dressing, pickleball, grooming, pushing, making the bed, housework, taking impact through the right UE, reaching (out, behind body, to back seat), sleeping (02/01/23); estimates 75% improvement, but there are still things like reaching behind her back that is not back to where she was (03/19/2023);  estimates 80% improvement (04/30/2023);  Goal status: MET  PLAN:  PT FREQUENCY: 1-2x/week  PT DURATION: 12 weeks  PLANNED INTERVENTIONS: Therapeutic exercises, Therapeutic activity, Neuromuscular re-education, Patient/Family education, Self Care, Joint mobilization, Dry Needling, Electrical stimulation, Spinal mobilization, Cryotherapy, Moist heat, Manual therapy, and Re-evaluation  PLAN FOR NEXT SESSION: update HEP as appropriate, progressive shoulder girdle/UE/postural/functional strengthening and  ROM exercises as tolerated, manual therapy and dry needling as needed. Education.   Consider the following:  Continue with joint mobilizations and addressing subscapularis trigger points.   Mobilization with movement for overhead movement with potential to add to HEP.    Joyce Brock, [place], Student-PT    [place]. Ilsa Iha, PT, DPT 05/22/23, 8:21 PM  [facility] [address] [place], [place] [address] P: [phone] I F: [phone]

## 2023-05-23 DIAGNOSIS — J301 Allergic rhinitis due to pollen: Secondary | ICD-10-CM | POA: Diagnosis not present

## 2023-05-24 ENCOUNTER — Encounter: Payer: Self-pay | Admitting: Physical Therapy

## 2023-05-24 ENCOUNTER — Ambulatory Visit: Payer: 59 | Admitting: Physical Therapy

## 2023-05-24 DIAGNOSIS — M25611 Stiffness of right shoulder, not elsewhere classified: Secondary | ICD-10-CM

## 2023-05-24 DIAGNOSIS — G8929 Other chronic pain: Secondary | ICD-10-CM

## 2023-05-24 DIAGNOSIS — M79621 Pain in right upper arm: Secondary | ICD-10-CM | POA: Diagnosis not present

## 2023-05-24 DIAGNOSIS — M25511 Pain in right shoulder: Secondary | ICD-10-CM | POA: Diagnosis not present

## 2023-05-24 DIAGNOSIS — M6281 Muscle weakness (generalized): Secondary | ICD-10-CM

## 2023-05-24 NOTE — Therapy (Addendum)
OUTPATIENT PHYSICAL THERAPY TREATMENT    Patient Name: Joyce Brock MRN: [id number] DOB:Mar 10, 1966, 57 y.o., female Today's Date: 05/24/2023  END OF SESSION:  PT End of Session - 05/24/23 1933     Visit Number 24    Number of Visits 25    Date for PT Re-Evaluation 07/23/23    Authorization Type [place] [employer] SAVE reporting period from 03/19/2023    Authorization Time Period *only 4 modalities per visit*  VL 25 PT/OT combined  medial review after 25th visit    Authorization - Number of Visits 25    Progress Note Due on Visit 30    PT Start Time 1813    PT Stop Time 1855    PT Time Calculation (min) 42 min    Activity Tolerance Patient tolerated treatment well;No increased pain    Behavior During Therapy WFL for tasks assessed/performed                 Past Medical History:  Diagnosis Date   Allergy    Anxiety    GERD (gastroesophageal reflux disease)    with certain foods/take OTC PRN meds   Hyperlipidemia    Migraines    Past Surgical History:  Procedure Laterality Date   CHOLECYSTECTOMY     COLONOSCOPY  2019   MS-MAC-suprep (good after lavage)-TA x 3/hems/tics   Patient Active Problem List   Diagnosis Date Noted   GERD (gastroesophageal reflux disease) 01/16/2020   Tachycardia    Pulmonary nodule    GAD (generalized anxiety disorder) 10/04/2018   Panic attack 10/04/2018   Atopic dermatitis 01/18/2017   Verruca vulgaris 11/15/2012   HYPERCHOLESTEROLEMIA 02/05/2009   Allergic rhinitis 10/30/2008   PSORIASIS 10/30/2008   URINARY INCONTINENCE, MIXED 10/30/2008    PCP: Excell Seltzer, MD  REFERRING PROVIDER: Excell Seltzer, MD  REFERRING DIAG: acute pain of right shoulder  THERAPY DIAG:  Chronic right shoulder pain  Pain in right upper arm  Stiffness of right shoulder, not elsewhere classified  Muscle weakness (generalized)  Rationale for Evaluation and Treatment: Rehabilitation  ONSET DATE: 6 months or more from PT  evaluation  PERTINENT HISTORY: Patient is a 57 y.o. female who presents to outpatient physical therapy with a referral for medical diagnosis acute pain of right shoulder. This patient's chief complaints consist of pain over the right posterior arm and lateral arm, difficulty using right arm, leading to the following functional deficits: difficulty working, using the computer mouse, reaching, dressing, pickleball, grooming, pushing, making the bed, housework, taking impact through the right UE, reaching (out, behind body, to back seat), sleeping. Relevant past medical history and comorbidities include has HYPERCHOLESTEROLEMIA; ALLERGIC RHINITIS; PSORIASIS; URINARY INCONTINENCE, MIXED; Palpitations; Rectal pain; Verruca vulgaris; Atopic dermatitis; GAD (generalized anxiety disorder); Panic attack; Vasovagal syncope; Pulmonary nodule; Tachycardia; GERD (gastroesophageal reflux disease); Tingling in extremities; Acute diarrhea; Acute pain of right shoulder; and Acute pain of left knee on their problem list. She  has a past medical history of Allergy, Anxiety, GERD (gastroesophageal reflux disease), Hyperlipidemia, and Migraines. She has a past surgical history that includes Cholecystectomy. Patient denies hx of cancer, stroke, seizures, lung problems, heart problems, diabetes, unexplained weight loss, unexplained changes in bowel or bladder problems, unexplained stumbling or dropping things, osteoporosis, and spinal surgery  SUBJECTIVE:  SUBJECTIVE STATEMENT: Patient has been feeling good since last session.  She has been doing most of her HEP exercises, except for the ones with weights. She doesn't have weights yet so she is doing those bodyweight now but is getting 4# and 5# weights.  PAIN:  NPRS: 0/10   PATIENT GOALS: "to be  able to do without the hurting and not damaging to the point I would need surgery or anything like that"  NEXT MD VISIT: no follow up for this specific problem  OBJECTIVE   OBJECTIVE  SELF-REPORTED FUNCTION FOTO score: 87/100 (Shoulder questionnaire)   TODAY'S TREATMENT:   Gross flexion and ABD ROM assessment as astricks test to assess response to manual therapy - therex  Therex UBE 5 minutes alternating each minute: level 10  Therex GHJ AP glide - self mob Attempted in various positions (cross body stretch, cross body stretch with band pulling shoulder back) that were ineffective at increasing ROM. Effective position was found, which was when patient propped up on her side with shoulder in cross-body position and elbow in the pilnth - patient shifted position until she could feel the stretch in her posterior shoulder. Held 2x30 seconds, and flexion ROM was increased.  Manual Right shoulder flexion contract-relax x2 in scaption, x4 in flexion  Therex hooklying Straight arm pullover with sled bar2x12 with 5# dumbbell  Therex Front raises x12 with 5# dumbbell, x12 with 4# dumbbell  Therex Prone swimmers: 2x12  Therex Lateral raises 2x12 with 4# weights  Theract Putting weights on shelf On higherst shelf she could reach: 2 sets of putting 15 lb  dumbbell on and off the shelf 12 times     Pt required multimodal cuing for proper technique and to facilitate improved neuromuscular control, strength, range of motion, and functional ability resulting in improved performance and form.  PATIENT EDUCATION: Education details: updates in HEP, increased visit frequency recommendation and further visits.  Person educated: Patient Education method: Explanation, Demonstration, Tactile cues, Verbal cues, and Handouts Education comprehension: verbalized understanding, returned demonstration, and needs further education  HOME EXERCISE PROGRAM:  [email or web address] View at "my-exercise-code.com" using code:  [email or web address]  Shoulder Flexion Stretch w/ Foam Roller -  Repeat 10 Repetitions, Hold 10 Seconds, Complete 2 Sets, Perform 1 Times a Day  DUMBBELL - FRONT RAISE - SEATED - 4# -  Repeat 8 Repetitions, Hold 1 Second(s), Complete 3 Sets, Perform 3 Times a Week  DUMBBELLS - LATERAL RAISE (4#) -  Repeat 8 Repetitions, Hold 1 Second(s), Complete 3 Sets, Perform 3 Times a Week  Straight Arm Row - W's with elastic band (Face Pulls) -  Repeat 10 Repetitions, Hold 2 Seconds, Complete 3 Sets, Perform 3 Times a Week  DUMBBELL SHOULDER PRESS - 5# -  Repeat 8 Repetitions, Hold 1 Second(s), Complete 3 Sets, Perform 3 Times a Week   ASSESSMENT:  CLINICAL IMPRESSION: Patient tolerated session well. A focus for today was helping patient to do find a way to do the joint mobs that were effective for her on her own to prepare for discharge. Once an effective position was found, patient was able to independently mob her shoulder. Her FOTO score significantly increased since her last progress note and patient no longer reports pain. Plan is to discharge patient at next visit with a sustainable long-term HEP and a way to do the contract-relax stretches independently. Patient would benefit from continued management of limiting condition by skilled physical therapist to address remaining impairments and functional limitations to work towards stated  goals and return to PLOF or maximal functional independence.    OBJECTIVE IMPAIRMENTS: decreased activity tolerance, decreased coordination, decreased endurance, decreased knowledge of condition, difficulty walking, decreased ROM, decreased strength, hypomobility, impaired perceived functional ability, increased muscle spasms, impaired flexibility, impaired UE functional use, improper body mechanics, and pain.   ACTIVITY LIMITATIONS: carrying, lifting, sleeping, bed mobility, bathing, dressing, reach over head, and caring for others  PARTICIPATION LIMITATIONS: meal prep, cleaning,  laundry, community activity, occupation, yard work, and   difficulty working, using the Firefighter, reaching, dressing, pickleball, grooming, pushing, making the bed, housework, taking impact through the right UE, reaching (out, behind body, to back seat), sleeping  PERSONAL FACTORS: Age, Past/current experiences, Time since onset of injury/illness/exacerbation, and 3+ comorbidities:   HYPERCHOLESTEROLEMIA; ALLERGIC RHINITIS; PSORIASIS; URINARY INCONTINENCE, MIXED; Palpitations; Rectal pain; Verruca vulgaris; Atopic dermatitis; GAD (generalized anxiety disorder); Panic attack; Vasovagal syncope; Pulmonary nodule; Tachycardia; GERD (gastroesophageal reflux disease); Tingling in extremities; Acute diarrhea; Acute pain of right shoulder; and Acute pain of left knee on their problem list. She  has a past medical history of Allergy, Anxiety, GERD (gastroesophageal reflux disease), Hyperlipidemia, and Migraines are also affecting patient's functional outcome.   REHAB POTENTIAL: Good  CLINICAL DECISION MAKING: Evolving/moderate complexity  EVALUATION COMPLEXITY: Moderate   GOALS: Goals reviewed with patient? No  SHORT TERM GOALS: Target date: 02/15/2023  Patient will be independent with initial home exercise program for self-management of symptoms. Baseline: Initial HEP provided at IE (02/01/23); Goal status: In-progress  LONG TERM GOALS: Target date: 04/26/2023. Target date for all unmet goals updated to 07/23/2023 on 04/30/2023.   Patient will be independent with a long-term home exercise program for self-management of symptoms.  Baseline: Initial HEP provided at IE (02/01/23); currently participating well (03/19/2023; 04/30/23);  Goal status: In-progress  2.  Patient will demonstrate improved FOTO to equal or greater than 68 by visit #10 to demonstrate improvement in overall condition and self-reported functional ability.  Baseline: 61 (02/01/23); 62 at visit #5 (02/21/2023); 71 at visit #10  (03/19/2023); 74 at visit #20 (05/01/2023);  Goal status: MET  3.  Patient will demonstrate R shoulder AROM equal or greater than L shoulder AROM to improve her ability to complete daily activities such as working, dressing, and playing pickleball.  Baseline: significant limitations - see objective (02/01/23); improved in mots motions but not met (04/30/2023);  Goal status: In-progress  4.  Patient will demonstrate R shoulder MMT equal or greater than L shoulder MMT with no increase in concordant pain to improve her ability to complete valued activities such as dressing, reaching, pushing, working, and pickleball.  Baseline: significant limitations (02/01/23); improved but not met (04/30/2023) Goal status: In-progress  5.  Patient will complete community, work and/or recreational activities with 75% less limitation due to current condition.  Baseline: difficulty working, using the computer mouse, reaching, dressing, pickleball, grooming, pushing, making the bed, housework, taking impact through the right UE, reaching (out, behind body, to back seat), sleeping (02/01/23); estimates 75% improvement, but there are still things like reaching behind her back that is not back to where she was (03/19/2023);  estimates 80% improvement (04/30/2023);  Goal status: MET  PLAN:  PT FREQUENCY: 1-2x/week  PT DURATION: 12 weeks  PLANNED INTERVENTIONS: Therapeutic exercises, Therapeutic activity, Neuromuscular re-education, Patient/Family education, Self Care, Joint mobilization, Dry Needling, Electrical stimulation, Spinal mobilization, Cryotherapy, Moist heat, Manual therapy, and Re-evaluation  PLAN FOR NEXT SESSION: update HEP as appropriate, progressive shoulder girdle/UE/postural/functional strengthening and ROM exercises as tolerated, manual therapy  and dry needling as needed. Education.   Consider the following:  Continue with joint mobilizations and addressing subscapularis trigger points.   Mobilization with movement for overhead movement with potential to add to HEP.    Fariha Goto, [place], Student-PT   [place]. Ilsa Iha, PT, DPT 05/24/23, 8:23 PM  [facility] [address] [place], [place] [address] P: [phone] I F: [phone]

## 2023-05-27 ENCOUNTER — Other Ambulatory Visit: Payer: Self-pay | Admitting: Family Medicine

## 2023-05-27 ENCOUNTER — Other Ambulatory Visit: Payer: Self-pay

## 2023-05-28 ENCOUNTER — Other Ambulatory Visit: Payer: Self-pay

## 2023-05-28 ENCOUNTER — Other Ambulatory Visit: Payer: Self-pay | Admitting: Family Medicine

## 2023-05-28 MED ORDER — METOPROLOL SUCCINATE ER 25 MG PO TB24
25.0000 mg | ORAL_TABLET | Freq: Every day | ORAL | 0 refills | Status: DC
  Filled 2023-05-28: qty 30, 30d supply, fill #0

## 2023-05-28 MED FILL — Paroxetine HCl Tab 10 MG: ORAL | 90 days supply | Qty: 90 | Fill #0 | Status: AC

## 2023-05-29 ENCOUNTER — Other Ambulatory Visit: Payer: Self-pay

## 2023-05-29 ENCOUNTER — Ambulatory Visit: Payer: 59

## 2023-05-29 DIAGNOSIS — M6281 Muscle weakness (generalized): Secondary | ICD-10-CM

## 2023-05-29 DIAGNOSIS — M25511 Pain in right shoulder: Secondary | ICD-10-CM | POA: Diagnosis not present

## 2023-05-29 DIAGNOSIS — M79621 Pain in right upper arm: Secondary | ICD-10-CM | POA: Diagnosis not present

## 2023-05-29 DIAGNOSIS — M25611 Stiffness of right shoulder, not elsewhere classified: Secondary | ICD-10-CM

## 2023-05-29 DIAGNOSIS — G8929 Other chronic pain: Secondary | ICD-10-CM | POA: Diagnosis not present

## 2023-05-29 NOTE — Therapy (Signed)
OUTPATIENT PHYSICAL THERAPY TREATMENT /DC    Patient Name: Joyce Brock MRN: [id number] DOB:Dec 07, 1965, 57 y.o., female Today's Date: 05/29/2023  END OF SESSION:  PT End of Session - 05/29/23 1518     Visit Number 25    Number of Visits 25    Date for PT Re-Evaluation 07/23/23    Authorization Type [place] [employer] SAVE reporting period from 03/19/2023    Authorization Time Period *only 4 modalities per visit* VL 25 PT/OT combined medial review after 25th visit    Progress Note Due on Visit 30    PT Start Time 1515    PT Stop Time 1555    PT Time Calculation (min) 40 min    Activity Tolerance Patient tolerated treatment well;No increased pain    Behavior During Therapy WFL for tasks assessed/performed                 Past Medical History:  Diagnosis Date   Allergy    Anxiety    GERD (gastroesophageal reflux disease)    with certain foods/take OTC PRN meds   Hyperlipidemia    Migraines    Past Surgical History:  Procedure Laterality Date   CHOLECYSTECTOMY     COLONOSCOPY  2019   MS-MAC-suprep (good after lavage)-TA x 3/hems/tics   Patient Active Problem List   Diagnosis Date Noted   GERD (gastroesophageal reflux disease) 01/16/2020   Tachycardia    Pulmonary nodule    GAD (generalized anxiety disorder) 10/04/2018   Panic attack 10/04/2018   Atopic dermatitis 01/18/2017   Verruca vulgaris 11/15/2012   HYPERCHOLESTEROLEMIA 02/05/2009   Allergic rhinitis 10/30/2008   PSORIASIS 10/30/2008   URINARY INCONTINENCE, MIXED 10/30/2008    PCP: Excell Seltzer, MD  REFERRING PROVIDER: Excell Seltzer, MD  REFERRING DIAG: acute pain of right shoulder  THERAPY DIAG:  Chronic right shoulder pain  Pain in right upper arm  Stiffness of right shoulder, not elsewhere classified  Muscle weakness (generalized)  Rationale for Evaluation and Treatment: Rehabilitation  ONSET DATE: 6 months or more from PT evaluation  PERTINENT HISTORY: Patient is a 57 y.o.  female who presents to outpatient physical therapy with a referral for medical diagnosis acute pain of right shoulder. This patient's chief complaints consist of pain over the right posterior arm and lateral arm, difficulty using right arm, leading to the following functional deficits: difficulty working, using the computer mouse, reaching, dressing, pickleball, grooming, pushing, making the bed, housework, taking impact through the right UE, reaching (out, behind body, to back seat), sleeping. Relevant past medical history and comorbidities include has HYPERCHOLESTEROLEMIA; ALLERGIC RHINITIS; PSORIASIS; URINARY INCONTINENCE, MIXED; Palpitations; Rectal pain; Verruca vulgaris; Atopic dermatitis; GAD (generalized anxiety disorder); Panic attack; Vasovagal syncope; Pulmonary nodule; Tachycardia; GERD (gastroesophageal reflux disease); Tingling in extremities; Acute diarrhea; Acute pain of right shoulder; and Acute pain of left knee on their problem list. She  has a past medical history of Allergy, Anxiety, GERD (gastroesophageal reflux disease), Hyperlipidemia, and Migraines. She has a past surgical history that includes Cholecystectomy. Patient denies hx of cancer, stroke, seizures, lung problems, heart problems, diabetes, unexplained weight loss, unexplained changes in bowel or bladder problems, unexplained stumbling or dropping things, osteoporosis, and spinal surgery  SUBJECTIVE:  SUBJECTIVE STATEMENT: Pt is ready for DC. Pt able to perform all meaningful tasks without limits. Pt aware of HEP, but has toruble with self mobilization.   PAIN:  NPRS: 0/10   PATIENT GOALS: "to be able to do without the hurting and not damaging to the point I would need surgery or anything like that"  NEXT MD VISIT: no follow up for this specific  problem  OBJECTIVE   OBJECTIVE  SELF-REPORTED FUNCTION FOTO score: 87/100 (Shoulder questionnaire)   TODAY'S TREATMENT:   Row 1 v Row 2 Chest press v wall pushups RW triceps dips, chair dips, plinth dips Education on HEP progression   Pt required multimodal cuing for proper technique and to facilitate improved neuromuscular control, strength, range of motion, and functional ability resulting in improved performance and form.  PATIENT EDUCATION: Education details: updates in HEP, increased visit frequency recommendation and further visits.  Person educated: Patient Education method: Explanation, Demonstration, Tactile cues, Verbal cues, and Handouts Education comprehension: verbalized understanding, returned demonstration, and needs further education  HOME EXERCISE PROGRAM:  [email or web address] View at "my-exercise-code.com" using code: [email or web address]  Shoulder Flexion Stretch w/ Foam Roller -  Repeat 10 Repetitions, Hold 10 Seconds, Complete 2 Sets, Perform 1 Times a Day  DUMBBELL - FRONT RAISE - SEATED - 4# -  Repeat 8 Repetitions, Hold 1 Second(s), Complete 3 Sets, Perform 3 Times a Week  DUMBBELLS - LATERAL RAISE (4#) -  Repeat 8 Repetitions, Hold 1 Second(s), Complete 3 Sets, Perform 3 Times a Week  Straight Arm Row - W's with elastic band (Face Pulls) -  Repeat 10 Repetitions, Hold 2 Seconds, Complete 3 Sets, Perform 3 Times a Week  DUMBBELL SHOULDER PRESS - 5# -  Repeat 8 Repetitions, Hold 1 Second(s), Complete 3 Sets, Perform 3 Times a Week   ASSESSMENT:  CLINICAL IMPRESSION: Pt prepared for DC and educated on self progression of resistance training moving forward. Time taken to address all questions. Pt to be DC after this session.    OBJECTIVE IMPAIRMENTS: decreased activity tolerance, decreased coordination, decreased endurance, decreased knowledge of condition, difficulty walking, decreased ROM, decreased strength, hypomobility, impaired perceived functional ability,  increased muscle spasms, impaired flexibility, impaired UE functional use, improper body mechanics, and pain.   ACTIVITY LIMITATIONS: carrying, lifting, sleeping, bed mobility, bathing, dressing, reach over head, and caring for others  PARTICIPATION LIMITATIONS: meal prep, cleaning, laundry, community activity, occupation, yard work, and   difficulty working, using the Firefighter, reaching, dressing, pickleball, grooming, pushing, making the bed, housework, taking impact through the right UE, reaching (out, behind body, to back seat), sleeping  PERSONAL FACTORS: Age, Past/current experiences, Time since onset of injury/illness/exacerbation, and 3+ comorbidities:   HYPERCHOLESTEROLEMIA; ALLERGIC RHINITIS; PSORIASIS; URINARY INCONTINENCE, MIXED; Palpitations; Rectal pain; Verruca vulgaris; Atopic dermatitis; GAD (generalized anxiety disorder); Panic attack; Vasovagal syncope; Pulmonary nodule; Tachycardia; GERD (gastroesophageal reflux disease); Tingling in extremities; Acute diarrhea; Acute pain of right shoulder; and Acute pain of left knee on their problem list. She  has a past medical history of Allergy, Anxiety, GERD (gastroesophageal reflux disease), Hyperlipidemia, and Migraines are also affecting patient's functional outcome.   REHAB POTENTIAL: Good  CLINICAL DECISION MAKING: Evolving/moderate complexity  EVALUATION COMPLEXITY: Moderate   GOALS: Goals reviewed with patient? No  SHORT TERM GOALS: Target date: 02/15/2023  Patient will be independent with initial home exercise program for self-management of symptoms. Baseline: Initial HEP provided at IE (02/01/23); Goal status: In-progress  LONG TERM GOALS: Target date: 04/26/2023. Target date for all unmet goals  updated to 07/23/2023 on 04/30/2023.   Patient will be independent with a long-term home exercise program for self-management of symptoms.  Baseline: Initial HEP provided at IE (02/01/23); currently participating well  (03/19/2023; 04/30/23);  Goal status: In-progress  2.  Patient will demonstrate improved FOTO to equal or greater than 68 by visit #10 to demonstrate improvement in overall condition and self-reported functional ability.  Baseline: 61 (02/01/23); 62 at visit #5 (02/21/2023); 71 at visit #10 (03/19/2023); 74 at visit #20 (05/01/2023);  Goal status: MET  3.  Patient will demonstrate R shoulder AROM equal or greater than L shoulder AROM to improve her ability to complete daily activities such as working, dressing, and playing pickleball.  Baseline: significant limitations - see objective (02/01/23); improved in mots motions but not met (04/30/2023);  Goal status: In-progress  4.  Patient will demonstrate R shoulder MMT equal or greater than L shoulder MMT with no increase in concordant pain to improve her ability to complete valued activities such as dressing, reaching, pushing, working, and pickleball.  Baseline: significant limitations (02/01/23); improved but not met (04/30/2023) Goal status: In-progress  5.  Patient will complete community, work and/or recreational activities with 75% less limitation due to current condition.  Baseline: difficulty working, using the computer mouse, reaching, dressing, pickleball, grooming, pushing, making the bed, housework, taking impact through the right UE, reaching (out, behind body, to back seat), sleeping (02/01/23); estimates 75% improvement, but there are still things like reaching behind her back that is not back to where she was (03/19/2023);  estimates 80% improvement (04/30/2023);  Goal status: MET  PLAN:  PT FREQUENCY: 1-2x/week  PT DURATION: 12 weeks  PLANNED INTERVENTIONS: Therapeutic exercises, Therapeutic activity, Neuromuscular re-education, Patient/Family education, Self Care, Joint mobilization, Dry Needling, Electrical stimulation, Spinal mobilization, Cryotherapy, Moist heat, Manual therapy, and Re-evaluation  PLAN FOR NEXT SESSION:    5:19 PM, 05/29/23 Rosamaria Lints, PT, DPT Physical Therapist - [place] [phone] (Office)   [facility] [address] [place], [place] [address] P: [phone] I F: [phone]

## 2023-05-30 DIAGNOSIS — J301 Allergic rhinitis due to pollen: Secondary | ICD-10-CM | POA: Diagnosis not present

## 2023-05-31 ENCOUNTER — Ambulatory Visit: Payer: 59 | Admitting: Physical Therapy

## 2023-06-04 ENCOUNTER — Ambulatory Visit: Payer: 59 | Admitting: Physical Therapy

## 2023-06-07 ENCOUNTER — Encounter: Payer: 59 | Admitting: Physical Therapy

## 2023-06-12 ENCOUNTER — Encounter: Payer: 59 | Admitting: Physical Therapy

## 2023-06-12 DIAGNOSIS — J301 Allergic rhinitis due to pollen: Secondary | ICD-10-CM | POA: Diagnosis not present

## 2023-06-20 DIAGNOSIS — J301 Allergic rhinitis due to pollen: Secondary | ICD-10-CM | POA: Diagnosis not present

## 2023-06-27 DIAGNOSIS — J301 Allergic rhinitis due to pollen: Secondary | ICD-10-CM | POA: Diagnosis not present

## 2023-06-28 ENCOUNTER — Ambulatory Visit: Payer: 59 | Admitting: Clinical

## 2023-06-28 DIAGNOSIS — F411 Generalized anxiety disorder: Secondary | ICD-10-CM | POA: Diagnosis not present

## 2023-06-28 NOTE — Progress Notes (Signed)
                Dezi Schaner, LCSW 

## 2023-06-28 NOTE — Progress Notes (Signed)
[facility] Counselor/Therapist Progress Note  Patient ID: ORRIS GRASSE, MRN: [id number],    Date: 06/28/2023  Time Spent: 10:38am - 11:22am : 44 minutes   Treatment Type: Individual Therapy  Reported Symptoms: Patient reported recent feelings of anxiety and shortness of breath  Mental Status Exam: Appearance:  Neat and Well Groomed     Behavior: Appropriate  Motor: Normal  Speech/Language:  Clear and Coherent  Affect: Appropriate  Mood: normal  Thought process: normal  Thought content:   WNL  Sensory/Perceptual disturbances:   WNL  Orientation: oriented to person, place, and situation  Attention: Good  Concentration: Good  Memory: WNL  Fund of knowledge:  Good  Insight:   Good  Judgment:  Good  Impulse Control: Good   Risk Assessment: Danger to Self:  No Patient denied current suicidal ideation  Self-injurious Behavior: No Danger to Others: No Patient denied current homicidal ideation Duty to Warn:no Physical Aggression / Violence:No  Access to Firearms a concern: No  Gang Involvement:No   Subjective: Patient stated, " really good", "Christmas went well, we had a great christmas holiday" in response to events since last session. Patient [place] session and stated, "its been good to get away". Patient stated, "I felt like I did fairly well throughout all the parties I had to do" in reference to holiday activities. Patient reported on New Years day patient experienced shortness of breath and experienced anxiety for several days in response to the shortness of breath. Patient stated, "nothing happened that would have triggered it". Patient reported she utilized the coping strategies discussed in therapy and stated, "I did try several of my relaxation and imagery". Patient stated, "I did use some of the techniques to get me back on track for at least a moment". Patient reported she utilized the 3-3-3 rule in  response to feelings of anxiety. Patient stated, "just realizing anxiety is a part of life and knowing that is going to go away and manage that". Patient stated, "I feel like the shortness of breath caused the anxiety". Patient reported she identified another way to display her mother's cross stitch ornaments in patient's craft room. Patient stated, "I was excited to figure out a different way to incorporate those (mother's ornaments)". Patient stated, "its good" in response to current symptoms of anxiety. Patient reported she is experiencing work related stressors. 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 Patient reported their department is undergoing departmental changes.   Interventions: Cognitive Behavioral Therapy. Clinician conducted session via caregility video from clinician's office at [facility]. Patient provided verbal consent to proceed with telehealth session and is aware of limitations of telephone or video visits. Patient participated in session from patient's home. Reviewed events since last session. Discussed recent symptoms of anxiety and identified coping strategies patient implemented in response. Assisted patient in exploring triggers for recent symptoms of anxiety. Reviewed coping strategies discussed during previous session in regard to patient feeling overwhelmed and the outcome. Provided supportive therapy, active listening, and validation as patient discussed work related stressors. Assessed patient's mood.    Collaboration of Care: Other not required at this time   Diagnosis:  Generalized anxiety disorder     Plan: Patient is to utilize [employer] Therapy, thought re-framing, relaxation techniques, mindfulness and coping strategies to decrease symptoms associated with Generalized Anxiety Disorder. Frequency: monthly  Modality: individual      Long-term  goal:   Reduce overall level, frequency, and  intensity of the feelings of anxiety and panic as evidenced by decrease in panic attacks, anxiety, negative thoughts, feeling defensive, difficulty focusing, feeling on edge, lack of motivation, and feeling unproductive from 3 to 5 days/week to 0 to 1 days/week per patient report for at least 3 consecutive months. Target Date: 09/07/23  Progress: progressing    Short-term goal:  Reduce overall level, frequency, and intensity of feeling overwhelmed, frustration, and irritability in response to the occurrence of unplanned events  Target Date: 03/10/23  Progress: patient reported she feels this goal has been met    Identify triggers for feeling overwhelmed, frustrated, and irritable when unplanned events occur and develop/implement strategies to process thoughts/feelings prior to responding.  Target Date: 03/10/23  Progress: patient reported she feels this goal has been met    Develop effective communication strategies for patient to utilize when expressing her thoughts and feelings to others in a healthy, controlled and assertive way  Target Date: 09/07/23  Progress: progressing    Verbally express an understanding of the relationship between feelings of anxiety and the impact on thinking patterns and behaviors.  Target Date: 03/10/23  Progress: patient reported she feels this goal has been met    Identify, challenge, and replace negative thought patterns that contribute to feelings of  anxiety with positive thoughts and beliefs per patient's report  Target Date: 03/10/23  Progress: patient reported she feels this goal has been met     Doree Barthel, LCSW

## 2023-07-04 DIAGNOSIS — J301 Allergic rhinitis due to pollen: Secondary | ICD-10-CM | POA: Diagnosis not present

## 2023-07-06 ENCOUNTER — Other Ambulatory Visit: Payer: Self-pay

## 2023-07-06 MED ORDER — METOPROLOL SUCCINATE ER 25 MG PO TB24
25.0000 mg | ORAL_TABLET | Freq: Every day | ORAL | 0 refills | Status: DC
  Filled 2023-07-06: qty 30, 30d supply, fill #0

## 2023-07-06 NOTE — Telephone Encounter (Signed)
Requested Prescriptions   Signed Prescriptions Disp Refills   metoprolol succinate (TOPROL XL) 25 MG 24 hr tablet 30 tablet 0    Sig: Take 1 tablet (25 mg total) by mouth daily. Overdue follow up visit.  PLEASE CALL OFFICE TO SCHEDULE APPOINTMENT PRIOR TO NEXT REFILL    Authorizing Provider: Debbe Odea    Ordering User: Guerry Minors

## 2023-07-06 NOTE — Telephone Encounter (Signed)
Last office visit:  07/27/22 with plan to f/u 3 months. next office visit: none/pt cx 10/26/22 appt  Please schedule f/u appt.  Thanks!

## 2023-07-06 NOTE — Telephone Encounter (Signed)
Left voicemail for return call

## 2023-07-09 NOTE — Telephone Encounter (Signed)
Appointment scheduled for 07/23/23

## 2023-07-10 DIAGNOSIS — J301 Allergic rhinitis due to pollen: Secondary | ICD-10-CM | POA: Diagnosis not present

## 2023-07-11 DIAGNOSIS — J301 Allergic rhinitis due to pollen: Secondary | ICD-10-CM | POA: Diagnosis not present

## 2023-07-18 DIAGNOSIS — J301 Allergic rhinitis due to pollen: Secondary | ICD-10-CM | POA: Diagnosis not present

## 2023-07-23 ENCOUNTER — Encounter: Payer: Self-pay | Admitting: Cardiology

## 2023-07-23 ENCOUNTER — Ambulatory Visit: Payer: 59 | Attending: Cardiology | Admitting: Cardiology

## 2023-07-23 ENCOUNTER — Other Ambulatory Visit: Payer: Self-pay

## 2023-07-23 VITALS — BP 110/78 | HR 60 | Ht 67.0 in | Wt 168.8 lb

## 2023-07-23 DIAGNOSIS — E7800 Pure hypercholesterolemia, unspecified: Secondary | ICD-10-CM

## 2023-07-23 DIAGNOSIS — R000 Tachycardia, unspecified: Secondary | ICD-10-CM

## 2023-07-23 MED ORDER — METOPROLOL SUCCINATE ER 25 MG PO TB24
25.0000 mg | ORAL_TABLET | Freq: Every day | ORAL | 0 refills | Status: DC
  Filled 2023-07-23 – 2023-08-06 (×2): qty 90, 90d supply, fill #0

## 2023-07-23 NOTE — Patient Instructions (Signed)
 Medication Instructions:   Your physician recommends that you continue on your current medications as directed. Please refer to the Current Medication list given to you today.  *If you need a refill on your cardiac medications before your next appointment, please call your pharmacy*   Lab Work:  None Ordered  If you have labs (blood work) drawn today and your tests are completely normal, you will receive your results only by: MyChart Message (if you have MyChart) OR A paper copy in the mail If you have any lab test that is abnormal or we need to change your treatment, we will call you to review the results.   Testing/Procedures:  None Ordered   Follow-Up: At [facility], you and your health needs are our priority.  As part of our continuing mission to provide you with exceptional heart care, we have created designated Provider Care Teams.  These Care Teams include your primary Cardiologist (physician) and Advanced Practice Providers (APPs -  Physician Assistants and Nurse Practitioners) who all work together to provide you with the care you need, when you need it.  We recommend signing up for the patient portal called "MyChart".  Sign up information is provided on this After Visit Summary.  MyChart is used to connect with patients for Virtual Visits (Telemedicine).  Patients are able to view lab/test results, encounter notes, upcoming appointments, etc.  Non-urgent messages can be sent to your provider as well.   To learn more about what you can do with MyChart, go to [email or web address].    Your next appointment:  AS NEEDED

## 2023-07-23 NOTE — Progress Notes (Signed)
 Cardiology Office Note:    Date:  07/23/2023   ID:  Joyce Brock, DOB Sep 12, 1965, MRN [id number]  PCP:  Judithann Novas, MD   [facility] Providers Cardiologist:  Constancia Delton, MD     Referring MD: Judithann Novas, MD   Chief Complaint  Patient presents with   Follow-up    Patient denies new or acute cardiac problems/concerns today.      History of Present Illness:    Joyce Brock is a 58 y.o. female with a hx of GERD, anxiety, hyperlipidemia who presents for follow-up.    Zetia  was previously started for hyperlipidemia.  She is not tolerant to higher doses of statin.  Currently takes Crestor 10 mg every other day.  Repeat cholesterol numbers showed controlled levels.  Tolerating medications, no adverse effects.  Overall feels well, no concerns at this time.   Prior notes/studies Coronary CT 07/2022 no CAD Echo 01/2020 showed normal systolic function, no wall motion abnormalities, EF 60 to 65%. Cardiac monitor 02/2020 occasional PACs and PVCs, no significant arrhythmias.  Past Medical History:  Diagnosis Date   Allergy    Anxiety    GERD (gastroesophageal reflux disease)    with certain foods/take OTC PRN meds   Hyperlipidemia    Migraines     Past Surgical History:  Procedure Laterality Date   CHOLECYSTECTOMY     COLONOSCOPY  2019   MS-MAC-suprep (good after lavage)-TA x 3/hems/tics    Current Medications: Current Meds  Medication Sig   acetaminophen  (TYLENOL ) 500 MG tablet Take 1,000 mg by mouth every 6 (six) hours as needed.   atorvastatin  (LIPITOR) 10 MG tablet Take 1 tablet (10 mg total) by mouth every other day.   CALCIUM -VITAMIN D  PO Take 1 tablet by mouth daily at 6 (six) AM.   cetirizine  (ZYRTEC ) 10 MG tablet Take 1 tablet by mouth once daily as needed (Patient taking differently: Take 10 mg by mouth daily.)   EPINEPHrine  (EPIPEN  2-PAK) 0.3 mg/0.3 mL IJ SOAJ injection Inject 0.3 mg into the muscle as directed.   ezetimibe  (ZETIA ) 10 MG  tablet Take 1 tablet (10 mg total) by mouth daily.   fluticasone  (FLONASE ) 50 MCG/ACT nasal spray Place 2 sprays into both nostrils daily.   PARoxetine  (PAXIL ) 10 MG tablet Take 1 tablet (10 mg total) by mouth daily.   pimecrolimus  (ELIDEL ) 1 % cream Apply twice daily to eyelids at first sign of symptoms. Use until clear.   Probiotic Product (ALIGN PO) Take 1 tablet by mouth daily.   vitamin B-12 (CYANOCOBALAMIN ) 1000 MCG tablet Take 1 tablet (1,000 mcg total) by mouth daily.   [DISCONTINUED] metoprolol  succinate (TOPROL  XL) 25 MG 24 hr tablet Take 1 tablet (25 mg total) by mouth daily. Overdue follow up visit.  PLEASE CALL OFFICE TO SCHEDULE APPOINTMENT PRIOR TO NEXT REFILL     Allergies:   Patient has no known allergies.   Social History   Socioeconomic History   Marital status: Married    Spouse name: Not on file   Number of children: 2   Years of education: Not on file   Highest education level: Not on file  Occupational History   Not on file  Tobacco Use   Smoking status: Former   Smokeless tobacco: Never  Vaping Use   Vaping status: Never Used  Substance and Sexual Activity   Alcohol use: Yes    Comment: Rare   Drug use: No   Sexual activity: Yes  Birth control/protection: Post-menopausal  Other Topics Concern   Not on file  Social History Narrative   Right handed   No caffeine    Three story home   Social Drivers of Health   Financial Resource Strain: Not on file  Food Insecurity: Not on file  Transportation Needs: Not on file  Physical Activity: Not on file  Stress: Not on file  Social Connections: Not on file     Family History: The patient's family history includes Cancer (age of onset: 15) in her sister; Irritable bowel syndrome in her mother; Osteoporosis in her mother; Stomach cancer (age of onset: 74) in her paternal grandfather. There is no history of Colon cancer, Esophageal cancer, Rectal cancer, or Colon polyps.  ROS:   Please see the history of  present illness.     All other systems reviewed and are negative.  EKGs/Labs/Other Studies Reviewed:    The following studies were reviewed today:   EKG Interpretation Date/Time:  Monday July 23 2023 08:33:02 EST Ventricular Rate:  60 PR Interval:  138 QRS Duration:  78 QT Interval:  420 QTC Calculation: 420 R Axis:   41  Text Interpretation: Normal sinus rhythm Nonspecific ST and T wave abnormality Confirmed by Constancia Delton ([id number]) on 07/23/2023 8:37:57 AM    Recent Labs: 11/22/2022: Hemoglobin 14.0; Platelets 177.0 03/15/2023: ALT 20; BUN 11; Creatinine, Ser 0.88; Potassium 4.0; Sodium 139  Recent Lipid Panel    Component Value Date/Time   CHOL 175 03/15/2023 0746   TRIG 119.0 03/15/2023 0746   HDL 57.40 03/15/2023 0746   CHOLHDL 3 03/15/2023 0746   VLDL 23.8 03/15/2023 0746   LDLCALC 93 03/15/2023 0746   LDLDIRECT 168.7 03/22/2012 0832     Risk Assessment/Calculations:         Physical Exam:    VS:  BP 110/78 (BP Location: Left Arm, Patient Position: Sitting, Cuff Size: Large)   Pulse 60   Ht 5\' 7"  (1.702 m)   Wt 168 lb 12.8 oz (76.6 kg)   LMP 03/23/2013   SpO2 97%   BMI 26.44 kg/m     Wt Readings from Last 3 Encounters:  07/23/23 168 lb 12.8 oz (76.6 kg)  04/24/23 165 lb (74.8 kg)  04/05/23 165 lb (74.8 kg)     GEN:  Well nourished, well developed in no acute distress HEENT: Normal NECK: No JVD; No carotid bruits CARDIAC: RRR, no murmurs, rubs, gallops RESPIRATORY:  Clear to auscultation without rales, wheezing or rhonchi  ABDOMEN: Soft, non-tender, non-distended MUSCULOSKELETAL:  No edema; No deformity  SKIN: Warm and dry NEUROLOGIC:  Alert and oriented x 3 PSYCHIATRIC:  Normal affect   ASSESSMENT:    1. Pure hypercholesterolemia   2. Tachycardia    PLAN:    In order of problems listed above:  Hyperlipidemia.cholesterol now controlled.  Continue Zetia  10 mg daily, Lipitor 10 mg every other day.  Patient is not tolerant to higher  doses of statin.  History of tachycardia, cardiac monitor with no significant arrhythmias.  Overall symptoms resolved with Toprol -XL.  This also helps with her anxiety.  Continue Toprol -XL 25 mg daily.  Okay to try and wean off beta-blocker to see if symptoms stay resolved.  Follow-up as needed.    Medication Adjustments/Labs and Tests Ordered: Current medicines are reviewed at length with the patient today.  Concerns regarding medicines are outlined above.  Orders Placed This Encounter  Procedures   EKG 12-Lead    Meds ordered this encounter  Medications  metoprolol  succinate (TOPROL  XL) 25 MG 24 hr tablet    Sig: Take 1 tablet (25 mg total) by mouth daily.    Dispense:  90 tablet    Refill:  0     Patient Instructions  Medication Instructions:   Your physician recommends that you continue on your current medications as directed. Please refer to the Current Medication list given to you today.  *If you need a refill on your cardiac medications before your next appointment, please call your pharmacy*   Lab Work:  None Ordered  If you have labs (blood work) drawn today and your tests are completely normal, you will receive your results only by: MyChart Message (if you have MyChart) OR A paper copy in the mail If you have any lab test that is abnormal or we need to change your treatment, we will call you to review the results.   Testing/Procedures:  None Ordered  Follow-Up: At [facility], you and your health needs are our priority.  As part of our continuing mission to provide you with exceptional heart care, we have created designated Provider Care Teams.  These Care Teams include your primary Cardiologist (physician) and Advanced Practice Providers (APPs -  Physician Assistants and Nurse Practitioners) who all work together to provide you with the care you need, when you need it.  We recommend signing up for the patient portal called "MyChart".  Sign up  information is provided on this After Visit Summary.  MyChart is used to connect with patients for Virtual Visits (Telemedicine).  Patients are able to view lab/test results, encounter notes, upcoming appointments, etc.  Non-urgent messages can be sent to your provider as well.   To learn more about what you can do with MyChart, go to [email or web address].    Your next appointment:     AS NEEDED       Signed, Constancia Delton, MD  07/23/2023 9:26 AM    [facility]

## 2023-07-25 DIAGNOSIS — J301 Allergic rhinitis due to pollen: Secondary | ICD-10-CM | POA: Diagnosis not present

## 2023-07-26 ENCOUNTER — Other Ambulatory Visit: Payer: Self-pay

## 2023-08-06 ENCOUNTER — Other Ambulatory Visit: Payer: Self-pay

## 2023-08-08 DIAGNOSIS — J301 Allergic rhinitis due to pollen: Secondary | ICD-10-CM | POA: Diagnosis not present

## 2023-08-09 ENCOUNTER — Ambulatory Visit: Payer: 59 | Admitting: Clinical

## 2023-08-09 DIAGNOSIS — F411 Generalized anxiety disorder: Secondary | ICD-10-CM

## 2023-08-09 NOTE — Progress Notes (Signed)
   Doree Barthel, LCSW

## 2023-08-09 NOTE — Progress Notes (Signed)
 [facility] Counselor/Therapist Progress Note  Patient ID: MARIENA MEARES, MRN: [id number],    Date: 08/09/2023  Time Spent: 10:36am -11: 38am : 62 minutes   Treatment Type: Individual Therapy  Reported Symptoms: worry, difficulty focusing at times  Mental Status Exam: Appearance:  Neat and Well Groomed     Behavior: Appropriate  Motor: Normal  Speech/Language:  Clear and Coherent and Normal Rate  Affect: Appropriate  Mood: normal  Thought process: normal  Thought content:   WNL  Sensory/Perceptual disturbances:   WNL  Orientation: oriented to person, place, and situation  Attention: Good  Concentration: Good  Memory: WNL  Fund of knowledge:  Good  Insight:   Good  Judgment:  Good  Impulse Control: Good   Risk Assessment: Danger to Self:  No Patient denied current suicidal ideation  Self-injurious Behavior: No Danger to Others: No Patient denied current homicidal ideation Duty to Warn:no Physical Aggression / Violence:No  Access to Firearms a concern: No  Gang Involvement:No   Subjective: Patient stated, "its been going well, I haven't really had an issues or incidences that I can really recall", "continuing to do the deep breaths". Patient stated, "this week my mind just seemed to be jumbled", "I couldn't focus, I had a lot of things going on", "realizing I had not taken time to be mindful". Patient reported she stopped, refocused and found the change helpful. Patient reported the difficulty focusing occurred while patient was driving. Patient reported she continues to use the mindfulness exercise of the five senses frequently and uses imagery of the [facility]. Patient reported feelings of uncertainty related to the direction of patient's department at work. Patient reported during a recent staff meeting patient stopped, counted to five, and then considered if patient's response was going to be helpful before responding. Patient stated, "it helped me  think through, is this answering what she's asking, is it a valid concern". Patient stated, "a lot of times I feel misunderstood", "I don't know if it's the way I'm communicating it". Patient reported she is "matter of fact" when communicating with others at work and patient acknowledged her response could be misinterpreted by others. Patient reported she feels her colleague is vague in colleague's responses to patient.  Patient reported she has been concerned about her niece and her niece's daughter and reported worry about niece's daughter's behaviors. Patient reported no recent feelings of anxiety.  Interventions: Cognitive Behavioral Therapy and supportive therapy . Clinician conducted session in person at clinician's office at [facility]. Reviewed events since last session. Discussed patient's report of difficulty focusing and reviewed mindfulness strategies patient implemented in response. Discussed work related stressors, challenges in communication with colleague, and patient's response. Provided supportive therapy, active listening, and validation as patient discussed concerns related to patient's department and patient's niece's daughter. Explored and identified triggers for decrease in focus. Provided psycho education related to mindfulness exercise of mindful eating and discussed additional coping strategies for patient to utilize, such as, listening to calming music. Assessed patient's mood.    Collaboration of Care: Other not required at this time   Diagnosis:  Generalized anxiety disorder     Plan: Patient is to utilize [employer] Therapy, thought re-framing, relaxation techniques, mindfulness and coping strategies to decrease symptoms associated with Generalized Anxiety Disorder. Frequency: monthly  Modality: individual      Long-term goal:   Reduce overall level, frequency, and intensity of the feelings of anxiety and panic as evidenced by decrease in panic  attacks, anxiety, negative thoughts, feeling defensive, difficulty focusing, feeling on edge, lack of motivation, and feeling unproductive from 3 to 5 days/week to 0 to 1 days/week per patient report for at least 3 consecutive months. Target Date: 09/07/23  Progress: progressing    Short-term goal:  Reduce overall level, frequency, and intensity of feeling overwhelmed, frustration, and irritability in response to the occurrence of unplanned events  Target Date: 03/10/23  Progress: patient reported she feels this goal has been met    Identify triggers for feeling overwhelmed, frustrated, and irritable when unplanned events occur and develop/implement strategies to process thoughts/feelings prior to responding.  Target Date: 03/10/23  Progress: patient reported she feels this goal has been met    Develop effective communication strategies for patient to utilize when expressing her thoughts and feelings to others in a healthy, controlled and assertive way  Target Date: 09/07/23  Progress: progressing    Verbally express an understanding of the relationship between feelings of anxiety and the impact on thinking patterns and behaviors.  Target Date: 03/10/23  Progress: patient reported she feels this goal has been met    Identify, challenge, and replace negative thought patterns that contribute to feelings of  anxiety with positive thoughts and beliefs per patient's report  Target Date: 03/10/23  Progress: patient reported she feels this goal has been met    Doree Barthel, LCSW

## 2023-08-15 DIAGNOSIS — J301 Allergic rhinitis due to pollen: Secondary | ICD-10-CM | POA: Diagnosis not present

## 2023-08-22 DIAGNOSIS — J301 Allergic rhinitis due to pollen: Secondary | ICD-10-CM | POA: Diagnosis not present

## 2023-08-27 ENCOUNTER — Other Ambulatory Visit: Payer: Self-pay

## 2023-08-27 MED FILL — Paroxetine HCl Tab 10 MG: ORAL | 90 days supply | Qty: 90 | Fill #1 | Status: AC

## 2023-08-28 ENCOUNTER — Other Ambulatory Visit: Payer: Self-pay

## 2023-08-29 DIAGNOSIS — J301 Allergic rhinitis due to pollen: Secondary | ICD-10-CM | POA: Diagnosis not present

## 2023-09-05 ENCOUNTER — Ambulatory Visit (INDEPENDENT_AMBULATORY_CARE_PROVIDER_SITE_OTHER): Payer: 59 | Admitting: Clinical

## 2023-09-05 DIAGNOSIS — J301 Allergic rhinitis due to pollen: Secondary | ICD-10-CM | POA: Diagnosis not present

## 2023-09-05 DIAGNOSIS — F411 Generalized anxiety disorder: Secondary | ICD-10-CM

## 2023-09-05 NOTE — Progress Notes (Signed)
   Doree Barthel, LCSW

## 2023-09-05 NOTE — Progress Notes (Signed)
 [facility] Counselor/Therapist Progress Note  Patient ID: ANIHYA TUMA, MRN: [id number],    Date: 09/05/2023  Time Spent: 2:35pm - 3:15pm : 40 minutes   Treatment Type: Individual Therapy  Reported Symptoms: none reported  Mental Status Exam: Appearance:  Neat and Well Groomed     Behavior: Appropriate  Motor: Normal  Speech/Language:  Clear and Coherent and Normal Rate  Affect: Appropriate  Mood: normal  Thought process: normal  Thought content:   WNL  Sensory/Perceptual disturbances:   WNL  Orientation: oriented to person, place, and situation  Attention: Good  Concentration: Good  Memory: WNL  Fund of knowledge:  Good  Insight:   Good  Judgment:  Good  Impulse Control: Good   Risk Assessment: Danger to Self:  No Patient denied current suicidal ideation  Self-injurious Behavior: No Danger to Others: No Patient denied current homicidal ideation Duty to Warn:no Physical Aggression / Violence:No  Access to Firearms a concern: No  Gang Involvement:No   Subjective: Patient stated, "good, they've been going really good, no issues" in response to events since last session. Patient reported she practiced a mindfulness exercise while driving and stated, "I wasn't as distracted", "it did help me refocus". Patient stated, "good" in response to mood since last session. Patient [place] patient reported she did not experience anxiety in response. Patient stated, "that's another uphill thing right now, but its going pretty good" in response to work related stressors. Patient reported a recent work related event and reported she did not experience anxiety in response. Patient stated, "I feel like I've done pretty good the last few months and weeks". Patient reported she has been utilizing deep breathing exercises and the mindfulness exercise of the five senses. Patient reported she is making her own bogg garden at home  for relaxation.   Interventions: Cognitive Behavioral Therapy. Clinician conducted session in person at clinician's office at [facility]. Reviewed events since last session. Reviewed patient's implementation of mindfulness strategies discussed during previous session and the outcome. Assessed patient's mood. Discussed previous triggers for anxiety and the status of triggers. Assessed frequency and intensity of anxiety since last session. During session, clinician guided patient in practicing progressive muscle relaxation.     Collaboration of Care: Other not required at this time   Diagnosis:  Generalized anxiety disorder     Plan: Patient is to utilize [employer] Therapy, thought re-framing, relaxation techniques, mindfulness and coping strategies to decrease symptoms associated with Generalized Anxiety Disorder. Frequency: monthly  Modality: individual      Long-term goal:   Reduce overall level, frequency, and intensity of the feelings of anxiety and panic as evidenced by decrease in panic attacks, anxiety, negative thoughts, feeling defensive, difficulty focusing, feeling on edge, lack of motivation, and feeling unproductive from 3 to 5 days/week to 0 to 1 days/week per patient report for at least 3 consecutive months. Target Date: 09/07/23  Progress: progressing    Short-term goal:  Reduce overall level, frequency, and intensity of feeling overwhelmed, frustration, and irritability in response to the occurrence of unplanned events  Target Date: 03/10/23  Progress: patient reported she feels this goal has been met    Identify triggers for feeling overwhelmed, frustrated, and irritable when unplanned events occur and develop/implement strategies to process thoughts/feelings prior to responding.  Target Date: 03/10/23  Progress: patient reported she feels this goal has been met    Develop effective communication strategies for patient to  utilize when expressing her  thoughts and feelings to others in a healthy, controlled and assertive way  Target Date: 09/07/23  Progress: progressing    Verbally express an understanding of the relationship between feelings of anxiety and the impact on thinking patterns and behaviors.  Target Date: 03/10/23  Progress: patient reported she feels this goal has been met    Identify, challenge, and replace negative thought patterns that contribute to feelings of  anxiety with positive thoughts and beliefs per patient's report  Target Date: 03/10/23  Progress: patient reported she feels this goal has been met     Doree Barthel, LCSW

## 2023-09-11 ENCOUNTER — Other Ambulatory Visit: Payer: Self-pay

## 2023-09-12 DIAGNOSIS — J301 Allergic rhinitis due to pollen: Secondary | ICD-10-CM | POA: Diagnosis not present

## 2023-09-26 DIAGNOSIS — J301 Allergic rhinitis due to pollen: Secondary | ICD-10-CM | POA: Diagnosis not present

## 2023-10-02 DIAGNOSIS — J301 Allergic rhinitis due to pollen: Secondary | ICD-10-CM | POA: Diagnosis not present

## 2023-10-03 DIAGNOSIS — J301 Allergic rhinitis due to pollen: Secondary | ICD-10-CM | POA: Diagnosis not present

## 2023-10-10 DIAGNOSIS — J301 Allergic rhinitis due to pollen: Secondary | ICD-10-CM | POA: Diagnosis not present

## 2023-10-11 ENCOUNTER — Ambulatory Visit: Admitting: Clinical

## 2023-10-11 DIAGNOSIS — F411 Generalized anxiety disorder: Secondary | ICD-10-CM | POA: Diagnosis not present

## 2023-10-11 NOTE — Progress Notes (Signed)
  Behavioral Health Counselor/Therapist Progress Note  Patient ID: Joyce Brock, MRN: [id number],    Date: 10/11/2023  Time Spent: 10:36am - 11:43am : 67 minutes   Treatment Type: Individual Therapy  Reported Symptoms: none reported  Mental Status Exam: Appearance:  Neat and Well Groomed     Behavior: Appropriate  Motor: Normal  Speech/Language:  Clear and Coherent and Normal Rate  Affect: Appropriate  Mood: normal  Thought process: normal  Thought content:   WNL  Sensory/Perceptual disturbances:   WNL  Orientation: oriented to person, place, and situation  Attention: Good  Concentration: Good  Memory: WNL  Fund of knowledge:  Good  Insight:   Good  Judgment:  Good  Impulse Control: Good   Risk Assessment: Danger to Self:  No Patient denied current suicidal ideation  Self-injurious Behavior: No Danger to Others: No Patient denied current homicidal ideation Duty to Warn:no Physical Aggression / Violence:No  Access to Firearms a concern: No  Gang Involvement:No   Subjective: Patient reported "good" in response to events since last session. Patient stated, "I haven't had really any issues, the times I've felt overwhelmed I've been able to work through those". Patient reported recently feeling overwhelmed when assisting a friend with her daughter's graduation invitations. Patient reported she practiced deep breathing exercises in response and stated, "that's my go to". In addition, patient reported she practiced the 5 senses exercise. Patient [place] had [place] some issues with the purchase. Patient stated, "they (property issues) really haven't bothered me". Patient stated, "I'm in a good place right now". Patient reported patient's husband wants to travel abroad. Patient reported traveling abroad is a trigger for anxiety and stated, "not knowing", "but I don't want to live in fear either". Patient stated,  "I feel good about it", "I think I'm in a good spot to be able to do that" in response to discontinuing therapy. Patient identified the following triggers for anxiety: uncertainty, over thinking, stated "putting too much on my plate", "the what ifs". Patient identified the following warning signs for anxiety: when an activity is no longer enjoyable and becomes distressing, feeling overwhelmed, sleep issues, social isolation, chest pressure/changes in breathing pattern. Patient identified the following self care strategies: taking a bath, working on patient's whimsical garden, sitting by the pond. Patient identified the following coping skills: deep breathing, 5 senses exercise, additional mindfulness exercises, imagery, 3-3-3 exercise, socratic questions. Patient identified the following signs to resume therapy: patient stated, "having negative thoughts that I can't shake", frequent warning signs, "anything that I feel like might need a work through".    Interventions: Cognitive Behavioral Therapy. Clinician conducted session in person at clinician's office at [facility]. Reviewed events since last session and assessed for changes. Discussed recent triggers for anxiety and coping strategies patient implemented in response.  Reviewed patient's progress in therapy and discussed patient discontinuing therapy due to patient's progress. Assisted patient in developing a mental health maintenance plan. Provided validation and praised patient's progress. No follow up appointment was scheduled due to patient achieving goals established in therapy and discontinuing therapy as a result.   Collaboration of Care: Other not required at this time   Diagnosis:  Generalized anxiety disorder     Plan: Patient is to utilize [employer] Therapy, thought re-framing, relaxation techniques, mindfulness and coping strategies to decrease symptoms associated with Generalized Anxiety Disorder. Frequency: monthly   Modality: individual      Long-term goal:  Reduce overall level, frequency, and intensity of the feelings of anxiety and panic as evidenced by decrease in panic attacks, anxiety, negative thoughts, feeling defensive, difficulty focusing, feeling on edge, lack of motivation, and feeling unproductive from 3 to 5 days/week to 0 to 1 days/week per patient report for at least 3 consecutive months. Target Date: 09/07/23  Progress: met    Short-term goal:  Reduce overall level, frequency, and intensity of feeling overwhelmed, frustration, and irritability in response to the occurrence of unplanned events  Target Date: 03/10/23  Progress: patient reported she feels this goal has been met    Identify triggers for feeling overwhelmed, frustrated, and irritable when unplanned events occur and develop/implement strategies to process thoughts/feelings prior to responding.  Target Date: 03/10/23  Progress: patient reported she feels this goal has been met    Develop effective communication strategies for patient to utilize when expressing her thoughts and feelings to others in a healthy, controlled and assertive way  Target Date: 09/07/23  Progress: met    Verbally express an understanding of the relationship between feelings of anxiety and the impact on thinking patterns and behaviors.  Target Date: 03/10/23  Progress: patient reported she feels this goal has been met    Identify, challenge, and replace negative thought patterns that contribute to feelings of  anxiety with positive thoughts and beliefs per patient's report  Target Date: 03/10/23  Progress: patient reported she feels this goal has been met     Burlene Carpen, LCSW

## 2023-10-11 NOTE — Progress Notes (Signed)
   Joyce Barthel, LCSW

## 2023-10-17 DIAGNOSIS — J301 Allergic rhinitis due to pollen: Secondary | ICD-10-CM | POA: Diagnosis not present

## 2023-10-24 DIAGNOSIS — J301 Allergic rhinitis due to pollen: Secondary | ICD-10-CM | POA: Diagnosis not present

## 2023-10-31 DIAGNOSIS — J301 Allergic rhinitis due to pollen: Secondary | ICD-10-CM | POA: Diagnosis not present

## 2023-11-09 ENCOUNTER — Other Ambulatory Visit: Payer: Self-pay

## 2023-11-09 MED ORDER — METOPROLOL SUCCINATE ER 25 MG PO TB24
25.0000 mg | ORAL_TABLET | Freq: Every day | ORAL | 3 refills | Status: DC
Start: 2023-11-09 — End: 2024-04-03
  Filled 2023-11-09: qty 90, 90d supply, fill #0

## 2023-11-14 DIAGNOSIS — J301 Allergic rhinitis due to pollen: Secondary | ICD-10-CM | POA: Diagnosis not present

## 2023-11-15 ENCOUNTER — Encounter: Payer: Self-pay | Admitting: Family Medicine

## 2023-11-15 ENCOUNTER — Ambulatory Visit: Admitting: Family Medicine

## 2023-11-15 ENCOUNTER — Other Ambulatory Visit: Payer: Self-pay

## 2023-11-15 VITALS — BP 120/80 | HR 88 | Temp 98.9°F | Ht 66.0 in | Wt 171.4 lb

## 2023-11-15 DIAGNOSIS — M25562 Pain in left knee: Secondary | ICD-10-CM

## 2023-11-15 DIAGNOSIS — R21 Rash and other nonspecific skin eruption: Secondary | ICD-10-CM

## 2023-11-15 DIAGNOSIS — M25561 Pain in right knee: Secondary | ICD-10-CM | POA: Diagnosis not present

## 2023-11-15 MED ORDER — TRIAMCINOLONE ACETONIDE 0.1 % EX CREA
1.0000 | TOPICAL_CREAM | Freq: Two times a day (BID) | CUTANEOUS | 0 refills | Status: AC
  Filled 2023-11-15: qty 15, 8d supply, fill #0

## 2023-11-15 NOTE — Assessment & Plan Note (Signed)
 Acute, rash not clearly consistent with erythema migrans.  Rash appears more eczema-like.  Will treat with topical triamcinolone  0.1% twice daily x 2 weeks.

## 2023-11-15 NOTE — Assessment & Plan Note (Signed)
 Acute, most likely patellofemoral syndrome versus osteoarthritis. Will check for tickborne illness given new onset rash and patient's concern level. Can continue to use glucosamine 500 mg 1 to 3 tablets daily as needed.  Can use topical Voltaren gel for pain and inflammation.  If not improving as expected she can return to see sports medicine for possible x-ray and re-eval.

## 2023-11-15 NOTE — Progress Notes (Signed)
 Patient ID: Joyce Brock, female    DOB: Nov 21, 1965, 58 y.o.   MRN: [id number]  This visit was conducted in person.  BP 120/80   Pulse 88   Temp 98.9 F (37.2 C) (Temporal)   Ht 5\' 6"  (1.676 m)   Wt 171 lb 6 oz (77.7 kg)   LMP 03/23/2013   SpO2 98%   BMI 27.66 kg/m    CC:  Chief Complaint  Patient presents with   Knee Pain    Bilateral-right is worse   Rash    On Back of Left Leg-Concern for Lyme disease No know recent tick bite that she is aware of    Subjective:   HPI: Joyce Brock is a 58 y.o. female presenting on 11/15/2023 for Knee Pain (Bilateral-right is worse) and Rash (On Back of Left Leg-Concern for Lyme disease/No know recent tick bite that she is aware of)   Bilateral knee pain, R>L , noted in last 3 week. Sudden onset.   Intermittently redness an heat in bilateral knee. No neck stiffness, no other joint pain.  Using aleve prn.  Has gradually improving this week, just stiffness.  No change inactivity, no falls. Did have to stand at work a lot.  No fever.  Has stopped metoprolol  per cardiology given minimal tachycardia  Has taken glucosamine     Has noted new rash on  back of left leg x few weeks, .. Itchy. Has history of eczema Has been applying  Elidel .. did not help much  No known tick bite.  Relevant past medical, surgical, family and social history reviewed and updated as indicated. Interim medical history since our last visit reviewed. Allergies and medications reviewed and updated. Outpatient Medications Prior to Visit  Medication Sig Dispense Refill   acetaminophen  (TYLENOL ) 500 MG tablet Take 1,000 mg by mouth every 6 (six) hours as needed.     atorvastatin  (LIPITOR) 10 MG tablet Take 1 tablet (10 mg total) by mouth every other day. 45 tablet 3   CALCIUM -VITAMIN D  PO Take 1 tablet by mouth daily at 6 (six) AM.     cetirizine  (ZYRTEC ) 10 MG tablet Take 10 mg by mouth daily.     EPINEPHrine  (EPIPEN  2-PAK) 0.3 mg/0.3 mL IJ SOAJ  injection Inject 0.3 mg into the muscle as directed. 2 each 10   ezetimibe  (ZETIA ) 10 MG tablet Take 1 tablet (10 mg total) by mouth daily. 90 tablet 3   fluticasone  (FLONASE ) 50 MCG/ACT nasal spray Place 2 sprays into both nostrils daily. 16 g 11   Glucosamine HCl (GLUCOSAMINE PO) Take 2 tablets by mouth daily.     PARoxetine  (PAXIL ) 10 MG tablet Take 1 tablet (10 mg total) by mouth daily. 90 tablet 1   pimecrolimus  (ELIDEL ) 1 % cream Apply twice daily to eyelids at first sign of symptoms. Use until clear. 30 g 3   Probiotic Product (ALIGN PO) Take 1 tablet by mouth daily.     vitamin B-12 (CYANOCOBALAMIN ) 1000 MCG tablet Take 1 tablet (1,000 mcg total) by mouth daily. 90 tablet 0   cetirizine  (ZYRTEC ) 10 MG tablet Take 1 tablet by mouth once daily as needed 60 tablet 14   metoprolol  succinate (TOPROL  XL) 25 MG 24 hr tablet Take 1 tablet (25 mg total) by mouth daily. (Patient not taking: Reported on 11/15/2023) 90 tablet 3   No facility-administered medications prior to visit.     Per HPI unless specifically indicated in ROS section below Review of Systems  Constitutional:  Negative for fatigue and fever.  HENT:  Negative for congestion.   Eyes:  Negative for pain.  Respiratory:  Negative for cough and shortness of breath.   Cardiovascular:  Negative for chest pain, palpitations and leg swelling.  Gastrointestinal:  Negative for abdominal pain.  Genitourinary:  Negative for dysuria and vaginal bleeding.  Musculoskeletal:  Negative for back pain.  Skin:  Positive for rash.  Neurological:  Negative for syncope, light-headedness and headaches.  Psychiatric/Behavioral:  Negative for dysphoric mood.    Objective:  BP 120/80   Pulse 88   Temp 98.9 F (37.2 C) (Temporal)   Ht 5\' 6"  (1.676 m)   Wt 171 lb 6 oz (77.7 kg)   LMP 03/23/2013   SpO2 98%   BMI 27.66 kg/m   Wt Readings from Last 3 Encounters:  11/15/23 171 lb 6 oz (77.7 kg)  07/23/23 168 lb 12.8 oz (76.6 kg)  04/24/23 165 lb  (74.8 kg)      Physical Exam Constitutional:      General: She is not in acute distress.    Appearance: Normal appearance. She is well-developed. She is not ill-appearing or toxic-appearing.  HENT:     Head: Normocephalic.     Right Ear: Hearing, tympanic membrane, ear canal and external ear normal. Tympanic membrane is not erythematous, retracted or bulging.     Left Ear: Hearing, tympanic membrane, ear canal and external ear normal. Tympanic membrane is not erythematous, retracted or bulging.     Nose: No mucosal edema or rhinorrhea.     Right Sinus: No maxillary sinus tenderness or frontal sinus tenderness.     Left Sinus: No maxillary sinus tenderness or frontal sinus tenderness.     Mouth/Throat:     Pharynx: Uvula midline.  Eyes:     General: Lids are normal. Lids are everted, no foreign bodies appreciated.     Conjunctiva/sclera: Conjunctivae normal.     Pupils: Pupils are equal, round, and reactive to light.  Neck:     Thyroid : No thyroid  mass or thyromegaly.     Vascular: No carotid bruit.     Trachea: Trachea normal.  Cardiovascular:     Rate and Rhythm: Normal rate and regular rhythm.     Pulses: Normal pulses.     Heart sounds: Normal heart sounds, S1 normal and S2 normal. No murmur heard.    No friction rub. No gallop.  Pulmonary:     Effort: Pulmonary effort is normal. No tachypnea or respiratory distress.     Breath sounds: Normal breath sounds. No decreased breath sounds, wheezing, rhonchi or rales.  Abdominal:     General: Bowel sounds are normal.     Palpations: Abdomen is soft.     Tenderness: There is no abdominal tenderness.  Musculoskeletal:     Cervical back: Normal range of motion and neck supple.     Right knee: Normal range of motion. No tenderness. Normal alignment, normal meniscus and normal patellar mobility.     Left knee: Normal range of motion. No tenderness. Normal alignment, normal meniscus and normal patellar mobility.  Skin:    General:  Skin is warm and dry.     Findings: Rash present.  Neurological:     Mental Status: She is alert.  Psychiatric:        Mood and Affect: Mood is not anxious or depressed.        Speech: Speech normal.        Behavior: Behavior normal.  Behavior is cooperative.        Thought Content: Thought content normal.        Judgment: Judgment normal.       Results for orders placed or performed in visit on 05/07/23  [facility] MAMMOGRAPHY   Collection Time: 05/04/23 12:31 PM  Result Value Ref Range   HM Mammogram 0-4 Bi-Rad 0-4 Bi-Rad, Self Reported Normal    Assessment and Plan  Skin rash Assessment & Plan: Acute, rash not clearly consistent with erythema migrans.  Rash appears more eczema-like.  Will treat with topical triamcinolone  0.1% twice daily x 2 weeks.  Orders: -     B. burgdorfi antibodies by WB -     Ehrlichia antibody panel -     Rocky mtn spotted fvr abs pnl(IgG+IgM)  Acute pain of both knees Assessment & Plan: Acute, most likely patellofemoral syndrome versus osteoarthritis. Will check for tickborne illness given new onset rash and patient's concern level. Can continue to use glucosamine 500 mg 1 to 3 tablets daily as needed.  Can use topical Voltaren gel for pain and inflammation.  If not improving as expected she can return to see sports medicine for possible x-ray and re-eval.  Orders: -     B. burgdorfi antibodies by WB -     Ehrlichia antibody panel -     Rocky mtn spotted fvr abs pnl(IgG+IgM)  Other orders -     Triamcinolone  Acetonide; Apply 1 Application topically 2 (two) times daily.  Dispense: 15 g; Refill: 0    No follow-ups on file.   Herby Lolling, MD

## 2023-11-20 ENCOUNTER — Ambulatory Visit: Payer: Self-pay | Admitting: Family Medicine

## 2023-11-20 LAB — B. BURGDORFI ANTIBODIES BY WB

## 2023-11-20 LAB — EHRLICHIA ANTIBODY PANEL
E. CHAFFEENSIS AB IGG: <1:64 {titer}
E. CHAFFEENSIS AB IGM: <1:20 {titer}

## 2023-11-20 LAB — ROCKY MTN SPOTTED FVR ABS PNL(IGG+IGM)
RMSF IgG: NOT DETECTED
RMSF IgM: NOT DETECTED

## 2023-11-21 DIAGNOSIS — J301 Allergic rhinitis due to pollen: Secondary | ICD-10-CM | POA: Diagnosis not present

## 2023-11-29 ENCOUNTER — Other Ambulatory Visit: Payer: Self-pay

## 2023-11-29 ENCOUNTER — Other Ambulatory Visit: Payer: Self-pay | Admitting: Family Medicine

## 2023-11-29 ENCOUNTER — Other Ambulatory Visit: Payer: Self-pay | Admitting: Cardiology

## 2023-11-30 ENCOUNTER — Other Ambulatory Visit: Payer: Self-pay

## 2023-11-30 MED FILL — Paroxetine HCl Tab 10 MG: ORAL | 90 days supply | Qty: 90 | Fill #0 | Status: AC

## 2023-12-02 ENCOUNTER — Other Ambulatory Visit: Payer: Self-pay

## 2023-12-02 ENCOUNTER — Other Ambulatory Visit: Payer: Self-pay | Admitting: Family Medicine

## 2023-12-03 ENCOUNTER — Other Ambulatory Visit: Payer: Self-pay

## 2023-12-03 MED FILL — Ezetimibe Tab 10 MG: ORAL | 90 days supply | Qty: 90 | Fill #0 | Status: AC

## 2023-12-04 ENCOUNTER — Ambulatory Visit
Admission: RE | Admit: 2023-12-04 | Discharge: 2023-12-04 | Disposition: A | Discharge status: Home / Self Care | Source: Ambulatory Visit | Attending: Family Medicine | Admitting: Family Medicine

## 2023-12-04 VITALS — BP 153/98 | HR 88 | Temp 98.5°F | Resp 18

## 2023-12-04 DIAGNOSIS — J029 Acute pharyngitis, unspecified: Secondary | ICD-10-CM | POA: Diagnosis not present

## 2023-12-04 LAB — POCT RAPID STREP A (OFFICE): Rapid Strep A Screen: NEGATIVE

## 2023-12-04 NOTE — ED Triage Notes (Signed)
 Sore throat. Dry cough x 3 days. White spots on the roof of her mouth.

## 2023-12-04 NOTE — Discharge Instructions (Signed)
 Your strep test is negative.  Culture of the throat will be sent, and staff will notify you if that is in turn positive.  Continue taking NyQuil and using Chloraseptic as needed for your symptoms.

## 2023-12-04 NOTE — ED Provider Notes (Signed)
 [facility]    CSN: [id number] Arrival date & time: 12/04/23  1356      History   Chief Complaint Chief Complaint  Patient presents with   Sore Throat    Red throat with white bump on roof of mouth - Entered by patient    HPI Joyce Brock is a 58 y.o. female.    Sore Throat  Here for sore throat.  On June 19 she had some sore throat for a couple of days and that got better and she developed a little bit of congestion and a little dry cough.  Then the throat worsened again today and so she came to be seen.  No fever or chills.  She did keep nursery for the vacation [employer] last week and she wonders if she was exposed to a sick child then.  NKDA  She is postmenopausal  Past Medical History:  Diagnosis Date   Allergy    Anxiety    GERD (gastroesophageal reflux disease)    with certain foods/take OTC PRN meds   Hyperlipidemia    Migraines     Patient Active Problem List   Diagnosis Date Noted   Skin rash 11/15/2023   Acute pain of both knees 11/22/2022   GERD (gastroesophageal reflux disease) 01/16/2020   Tachycardia    Pulmonary nodule    GAD (generalized anxiety disorder) 10/04/2018   Panic attack 10/04/2018   Atopic dermatitis 01/18/2017   Verruca vulgaris 11/15/2012   HYPERCHOLESTEROLEMIA 02/05/2009   Allergic rhinitis 10/30/2008   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      Home Medications    Prior to Admission medications   Medication Sig Start Date End Date Taking? Authorizing Provider  acetaminophen  (TYLENOL ) 500 MG tablet Take 1,000 mg by mouth every 6 (six) hours as needed.   Yes [provider]  atorvastatin  (LIPITOR) 10 MG tablet Take 1 tablet (10 mg total) by mouth every other day.  03/22/23 03/21/24 Yes Bedsole, Amy E, MD  CALCIUM -VITAMIN D  PO Take 1 tablet by mouth daily at 6 (six) AM.   Yes [provider]  cetirizine  (ZYRTEC ) 10 MG tablet Take 10 mg by mouth daily.   Yes [provider]  ezetimibe  (ZETIA ) 10 MG tablet Take 1 tablet (10 mg total) by mouth daily. 12/03/23 03/02/24 Yes Agbor-Etang, Redell, MD  fluticasone  (FLONASE ) 50 MCG/ACT nasal spray Place 2 sprays into both nostrils daily. 04/11/23  Yes   Glucosamine HCl (GLUCOSAMINE PO) Take 2 tablets by mouth daily.   Yes [provider]  PARoxetine  (PAXIL ) 10 MG tablet Take 1 tablet (10 mg total) by mouth daily. 11/30/23  Yes Bedsole, Amy E, MD  pimecrolimus  (ELIDEL ) 1 % cream Apply twice daily to eyelids at first sign of symptoms. Use until clear. 06/13/22  Yes Jackquline Sawyer, MD  Probiotic Product (ALIGN PO) Take 1 tablet by mouth daily.   Yes [provider]  triamcinolone  cream (KENALOG ) 0.1 % Apply 1 Application topically 2 (two) times daily. 11/15/23  Yes Bedsole, Amy E, MD  vitamin B-12 (CYANOCOBALAMIN ) 1000 MCG tablet Take 1 tablet (1,000 mcg total) by mouth daily. 12/28/21  Yes Bedsole, Amy E, MD  EPINEPHrine  (EPIPEN  2-PAK) 0.3 mg/0.3 mL IJ SOAJ injection Inject 0.3  mg into the muscle as directed. 04/11/23     metoprolol  succinate (TOPROL  XL) 25 MG 24 hr tablet Take 1 tablet (25 mg total) by mouth daily. Patient not taking: Reported on 11/15/2023 11/09/23   Darliss Rogue, MD    Family History Family History  Problem Relation Age of Onset   Osteoporosis Mother    Irritable bowel syndrome Mother    Cancer Sister 50       breast    Stomach cancer Paternal Grandfather 52   Colon cancer Neg Hx    Esophageal cancer Neg Hx    Rectal cancer Neg Hx    Colon polyps Neg Hx     Social History Social History   Tobacco Use   Smoking status: Former   Smokeless tobacco: Never  Vaping Use   Vaping status: Never Used  Substance Use Topics   Alcohol use: Yes    Comment: Rare    Drug use: No     Allergies   Patient has no known allergies.   Review of Systems Review of Systems   Physical Exam Triage Vital Signs ED Triage Vitals [12/04/23 1410]  Encounter Vitals Group     BP (!) 153/98     Girls Systolic BP Percentile      Girls Diastolic BP Percentile      Boys Systolic BP Percentile      Boys Diastolic BP Percentile      Pulse Rate 88     Resp 18     Temp 98.5 F (36.9 C)     Temp Source Oral     SpO2 97 %     Weight      Height      Head Circumference      Peak Flow      Pain Score      Pain Loc      Pain Education      Exclude from Growth Chart    No data found.  Updated Vital Signs BP (!) 153/98 (BP Location: Left Arm)   Pulse 88   Temp 98.5 F (36.9 C) (Oral)   Resp 18   LMP 03/23/2013   SpO2 97%   Visual Acuity Right Eye Distance:   Left Eye Distance:   Bilateral Distance:    Right Eye Near:   Left Eye Near:    Bilateral Near:     Physical Exam Vitals reviewed.  Constitutional:      General: She is not in acute distress.    Appearance: She is not ill-appearing, toxic-appearing or diaphoretic.  HENT:     Right Ear: Tympanic membrane and ear canal normal.     Left Ear: Tympanic membrane and ear canal normal.     Nose: Congestion present.     Mouth/Throat:     Mouth: Mucous membranes are moist.     Comments: There is mild erythema of the posterior oropharynx and no tonsillar hypertrophy.  There is a single ulceration on her soft palate about 3 mm in diameter.  Mucous membranes are moist and pink  Eyes:     Extraocular Movements: Extraocular movements intact.     Conjunctiva/sclera: Conjunctivae normal.     Pupils: Pupils are equal, round, and reactive to light.    Cardiovascular:     Rate and Rhythm: Normal rate and regular rhythm.     Heart sounds: No murmur heard. Pulmonary:     Effort: No respiratory distress.     Breath sounds: No stridor. No  wheezing, rhonchi or rales.   Musculoskeletal:     Cervical  back: Neck supple.  Lymphadenopathy:     Cervical: No cervical adenopathy.   Skin:    Capillary Refill: Capillary refill takes less than 2 seconds.     Coloration: Skin is not jaundiced or pale.   Neurological:     General: No focal deficit present.     Mental Status: She is alert and oriented to person, place, and time.   Psychiatric:        Behavior: Behavior normal.      UC Treatments / Results  Labs (all labs ordered are listed, but only abnormal results are displayed) Labs Reviewed  POCT RAPID STREP A (OFFICE) - Normal  CULTURE, GROUP A STREP [facility])    EKG   Radiology No results found.  Procedures Procedures (including critical care time)  Medications Ordered in UC Medications - No data to display  Initial Impression / Assessment and Plan / UC Course  I have reviewed the triage vital signs and the nursing notes.  Pertinent labs & imaging results that were available during my care of the patient were reviewed by me and considered in my medical decision making (see chart for details).     Rapid strep is negative.  Throat culture is sent and we will notify and treat protocol if that is positive  I discussed with her that I think this is most likely a viral illness.  She will continue her over-the-counter remedies including NyQuil. Final Clinical Impressions(s) / UC Diagnoses   Final diagnoses:  Acute pharyngitis, unspecified etiology     Discharge Instructions      Your strep test is negative.  Culture of the throat will be sent, and staff will notify you if that is in turn positive.  Continue taking NyQuil and using Chloraseptic as needed for your symptoms.     ED Prescriptions   None    PDMP not reviewed this encounter.   Vonna Sharlet POUR, MD 12/04/23 [phone]

## 2023-12-05 DIAGNOSIS — J301 Allergic rhinitis due to pollen: Secondary | ICD-10-CM | POA: Diagnosis not present

## 2023-12-06 LAB — CULTURE, GROUP A STREP (THRC)

## 2023-12-19 DIAGNOSIS — J301 Allergic rhinitis due to pollen: Secondary | ICD-10-CM | POA: Diagnosis not present

## 2023-12-25 DIAGNOSIS — J301 Allergic rhinitis due to pollen: Secondary | ICD-10-CM | POA: Diagnosis not present

## 2023-12-26 DIAGNOSIS — J301 Allergic rhinitis due to pollen: Secondary | ICD-10-CM | POA: Diagnosis not present

## 2024-01-02 DIAGNOSIS — J301 Allergic rhinitis due to pollen: Secondary | ICD-10-CM | POA: Diagnosis not present

## 2024-01-09 DIAGNOSIS — J301 Allergic rhinitis due to pollen: Secondary | ICD-10-CM | POA: Diagnosis not present

## 2024-01-23 DIAGNOSIS — J301 Allergic rhinitis due to pollen: Secondary | ICD-10-CM | POA: Diagnosis not present

## 2024-01-30 DIAGNOSIS — H524 Presbyopia: Secondary | ICD-10-CM | POA: Diagnosis not present

## 2024-01-30 DIAGNOSIS — J301 Allergic rhinitis due to pollen: Secondary | ICD-10-CM | POA: Diagnosis not present

## 2024-02-06 DIAGNOSIS — J301 Allergic rhinitis due to pollen: Secondary | ICD-10-CM | POA: Diagnosis not present

## 2024-02-07 ENCOUNTER — Encounter: Payer: Self-pay | Admitting: Sports Medicine

## 2024-02-07 ENCOUNTER — Other Ambulatory Visit: Payer: Self-pay

## 2024-02-07 ENCOUNTER — Other Ambulatory Visit (INDEPENDENT_AMBULATORY_CARE_PROVIDER_SITE_OTHER): Payer: Self-pay

## 2024-02-07 ENCOUNTER — Ambulatory Visit (INDEPENDENT_AMBULATORY_CARE_PROVIDER_SITE_OTHER): Admitting: Sports Medicine

## 2024-02-07 DIAGNOSIS — Z840 Family history of diseases of the skin and subcutaneous tissue: Secondary | ICD-10-CM

## 2024-02-07 DIAGNOSIS — M25561 Pain in right knee: Secondary | ICD-10-CM

## 2024-02-07 DIAGNOSIS — M25462 Effusion, left knee: Secondary | ICD-10-CM | POA: Diagnosis not present

## 2024-02-07 DIAGNOSIS — M25461 Effusion, right knee: Secondary | ICD-10-CM | POA: Diagnosis not present

## 2024-02-07 DIAGNOSIS — M25562 Pain in left knee: Secondary | ICD-10-CM | POA: Diagnosis not present

## 2024-02-07 DIAGNOSIS — G8929 Other chronic pain: Secondary | ICD-10-CM

## 2024-02-07 DIAGNOSIS — Z8261 Family history of arthritis: Secondary | ICD-10-CM

## 2024-02-07 NOTE — Progress Notes (Addendum)
 [name] - 58 y.o. female MRN [id number]  Date of birth: 08/13/1965  Office Visit Note: Visit Date: 02/07/2024 PCP: Avelina Greig BRAVO, MD Referred by: Avelina Greig BRAVO, MD  Subjective: Chief Complaint  Patient presents with   Left Knee - Pain   Right Knee - Pain   HPI: Joyce Brock is a pleasant 58 y.o. female who presents today for chronic bilateral knee pain. 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  Back in May, she had rather sudden onset of knee pain and significant swelling in both of her knees.  At that time, given the sudden onset with swelling and previous tick exposure, she did see her PCP who tested for Lyme disease, Erhlichosis and [facility] spotted fever, all of which were negative.  For that month she had rather significant pain with limitation in range of motion and swelling.  Over the last few months her pain has certainly gotten better and her swelling has markedly reduced.  At this point she has little pain but still is present.  Most of her pain is worse with steps, going up and down and transitioning from a seated to standing position.  She does notice a subtle pop in the knees with this.  She had been taking Aleve only as needed as well as ice.  She does have a positive family history of autoimmune disease including a sister with rheumatoid arthritis as well as another female family member with lupus.  She has not been tested for these herself.  Pertinent ROS were reviewed with the patient and found to be negative unless otherwise specified above in HPI.   Assessment & Plan: Visit Diagnoses:  1. Chronic pain of both knees   2. Effusion, left knee   3. Effusion, right knee   4. Family history of rheumatoid arthritis   5. Family history of lupus erythematosus    Plan: Impression is chronic bilateral knee pain with associated effusions with rather quick and insidious onset.  There are some  early arthritic changes in the joint and patellofemoral space but certainly not significant.  Given the rather sudden onset of her knee pain with associated effusions and her family history of 2+ rheumatologic arthritis, I am concerned about possible rheumatologic arthritis such as RA, etc. given this, we will obtain rheumatologic labs including ESR, ANA, RF, anti-CCP and anti-Smith antibodies.  I will message her once these return.  Could certainly consider ultrasound-guided aspiration and injection, although her pain is very minimal at this point so we will hold on this for now.  Depending on the results from the lab, this may include referral to rheumatology versus conservative treatment management for her knees and mild arthritic change.  I did provide her holistic and conservative treatment options for overall knee health and arthritis, see AVS.  Likely consider a home exercise regimen or PT referral depending on laboratories else as above.  Follow-up: Return for I will message her once labs result.   Meds & Orders: No orders of the defined types were placed in this encounter.   Orders Placed This Encounter  Procedures   XR Knee Complete 4 Views Right   XR Knee Complete 4 Views Left   US  Extrem Low Right Ltd   Sed Rate (ESR)   Antinuclear Antib (ANA)   Rheumatoid Factor   Cyclic citrul peptide antibody, IgG   Anti-Smith antibody     Procedures: No procedures performed  Clinical History: No specialty comments available.  She reports that she has quit smoking. She has never used smokeless tobacco. No results for input(s): HGBA1C, LABURIC in the last 8760 hours.  Objective:   Vital Signs: LMP 03/23/2013   Physical Exam  Gen: Well-appearing, in no acute distress; non-toxic CV: Well-perfused. Warm.  Resp: Breathing unlabored on room air; no wheezing. Psych: Fluid speech in conversation; appropriate affect; normal thought process  Ortho Exam - Bilateral knees: The right  knee has a small to moderate effusion, the left knee has a small effusion present.  Very mild warmth to the knees without redness.  Range of motion is full and intact except for about 5 degrees of flexion blocking of the right knee.  No specific joint line tenderness.  No varus or valgus instability.  Negative McMurray's testing.  Imaging:   US  Extrem Low Right Ltd Result Date: 02/07/2024 Limited musculoskeletal ultrasound of the right and left knee was performed today.  The right knee demonstrates a moderate effusion with associated hyperemia in the suprapatellar pouch.  The left knee shows a small effusion with mild hyperemia associated.  There is mild patellofemoral narrowing but no significant cortical regularity.  Overlying quadricep tendon is intact without any evidence of tearing.   Moderate right knee effusion, mild left knee effusion  XR Knee Complete 4 Views Right Result Date: 02/07/2024 4 view x-ray of bilateral knee including bilateral AP standing, Rosenberg, lateral and sunrise views were ordered and reviewed by myself today.  X-rays demonstrate mild left and minimal right medial tibiofemoral joint space narrowing.  There is mild patellofemoral arthritic change but no advanced tricompartmental arthritic change.  Questionable joint effusion noted on lateral views.  XR Knee Complete 4 Views Left Result Date: 02/07/2024 4 view x-ray of bilateral knee including bilateral AP standing, Rosenberg, lateral and sunrise views were ordered and reviewed by myself today.  X-rays demonstrate mild left and minimal right medial tibiofemoral joint space narrowing.  There is mild patellofemoral arthritic change but no advanced tricompartmental arthritic change.  Questionable joint effusion noted on lateral views.   Past Medical/Family/Surgical/Social History: Medications & Allergies reviewed per EMR, new medications updated. 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   URINARY INCONTINENCE, MIXED 10/30/2008   Past Medical History:  Diagnosis Date   Allergy    Anxiety    GERD (gastroesophageal reflux disease)    with certain foods/take OTC PRN meds   Hyperlipidemia    Migraines    Family History  Problem Relation Age of Onset   Osteoporosis Mother    Irritable bowel syndrome Mother    Cancer Sister 61       breast    Stomach cancer Paternal Grandfather 39   Colon cancer Neg Hx    Esophageal cancer Neg Hx    Rectal cancer Neg Hx    Colon polyps Neg Hx    Past Surgical History:  Procedure Laterality Date   CHOLECYSTECTOMY     COLONOSCOPY  2019   MS-MAC-suprep (good after lavage)-TA x 3/hems/tics   Social History   Occupational History   Not on file  Tobacco Use   Smoking status: Former   Smokeless tobacco: Never  Vaping Use   Vaping status: Never Used  Substance and Sexual Activity   Alcohol use: Yes    Comment: Rare   Drug use: No   Sexual activity: Yes    Birth control/protection: Post-menopausal

## 2024-02-07 NOTE — Progress Notes (Signed)
 Patient says that both of her knees have hurt since May. She says it was very sudden onset of pain, and she did have swelling. She says that her pain is primarily in the front of the knees, and she notices it most when going from seated to standing, and going up and down the stairs. She does notice a pop in the knees with these movements as well. She takes Aleve only as needed, which does give her relief, as well as ice. She mentions that autoimmune diseases run in her family, including rheumatoid arthritis.

## 2024-02-07 NOTE — Patient Instructions (Signed)
 Dr. Burnetta' Guide for Management of Knee-Related Arthritis:  1.)  Keep your body moving - keep working on flexibility and range of motion for the knee.  Good physical activity exercise include: stationary bike, swimming, elliptical.  2.)  Maintain a healthy weight --> 1 pound of body weight = 4 pounds of weight on the knees.  Even small weight loss (5-10+ lbs) can significantly improve pain  3.)  Supplements helpful for arthritis and inflammation: Turmeric (curcumin) Boswellia serrata, collagen hydrolysate, Glucosamine-chondroitin 4.)  Foods helpful for arthritis and inflammation: Fish and foods containing omega-3's, leafy greens (broccoli, spinach), citrus fruits (grapefruit, oranges, lemons), green tea, blueberries, cherries, olive oil (EVO)  *It is very important to stay hydrated, drinking lots of water throughout the day. This helps hydrate structures within the knee.  5.) Medicines:    - Topical pain relievers: Voltaren gel, Arnica gel, Tiger balm, lidocaine/IcyHot patches    - Anti-inflammatories like Aleve, Motrin, ibuprofen --> we can consider prescription based NSAIDs as well if needed

## 2024-02-13 DIAGNOSIS — J301 Allergic rhinitis due to pollen: Secondary | ICD-10-CM | POA: Diagnosis not present

## 2024-02-13 LAB — SEDIMENTATION RATE: Sed Rate: 14 mm/h (ref 0–30)

## 2024-02-13 LAB — RHEUMATOID FACTOR: Rheumatoid fact SerPl-aCnc: <10 [IU]/mL (ref <14)

## 2024-02-13 LAB — ANA: Anti Nuclear Antibody (ANA): POSITIVE — AB

## 2024-02-13 LAB — ANTI-NUCLEAR AB-TITER (ANA TITER): ANA Titer 1: 1:40 {titer} — ABNORMAL HIGH

## 2024-02-13 LAB — ANTI-SMITH ANTIBODY: ENA SM Ab Ser-aCnc: <1 AI

## 2024-02-13 LAB — CYCLIC CITRUL PEPTIDE ANTIBODY, IGG: Cyclic Citrullin Peptide Ab: <16 U

## 2024-02-14 ENCOUNTER — Ambulatory Visit: Payer: Self-pay | Admitting: Sports Medicine

## 2024-02-20 ENCOUNTER — Encounter: Payer: Self-pay | Admitting: Sports Medicine

## 2024-02-20 ENCOUNTER — Other Ambulatory Visit: Payer: Self-pay | Admitting: Sports Medicine

## 2024-02-20 ENCOUNTER — Other Ambulatory Visit: Payer: Self-pay

## 2024-02-20 DIAGNOSIS — G8929 Other chronic pain: Secondary | ICD-10-CM

## 2024-02-20 DIAGNOSIS — J301 Allergic rhinitis due to pollen: Secondary | ICD-10-CM | POA: Diagnosis not present

## 2024-02-20 DIAGNOSIS — Z8261 Family history of arthritis: Secondary | ICD-10-CM

## 2024-02-20 DIAGNOSIS — Z840 Family history of diseases of the skin and subcutaneous tissue: Secondary | ICD-10-CM

## 2024-02-20 MED FILL — Paroxetine HCl Tab 10 MG: ORAL | 90 days supply | Qty: 90 | Fill #1 | Status: AC

## 2024-02-20 MED FILL — Ezetimibe Tab 10 MG: ORAL | 90 days supply | Qty: 90 | Fill #1 | Status: AC

## 2024-02-27 DIAGNOSIS — J301 Allergic rhinitis due to pollen: Secondary | ICD-10-CM | POA: Diagnosis not present

## 2024-02-28 ENCOUNTER — Other Ambulatory Visit: Payer: Self-pay | Admitting: Sports Medicine

## 2024-02-28 DIAGNOSIS — Z840 Family history of diseases of the skin and subcutaneous tissue: Secondary | ICD-10-CM

## 2024-02-28 DIAGNOSIS — Z8261 Family history of arthritis: Secondary | ICD-10-CM

## 2024-03-05 DIAGNOSIS — J301 Allergic rhinitis due to pollen: Secondary | ICD-10-CM | POA: Diagnosis not present

## 2024-03-12 DIAGNOSIS — J301 Allergic rhinitis due to pollen: Secondary | ICD-10-CM | POA: Diagnosis not present

## 2024-03-14 DIAGNOSIS — J301 Allergic rhinitis due to pollen: Secondary | ICD-10-CM | POA: Diagnosis not present

## 2024-03-19 ENCOUNTER — Telehealth: Payer: Self-pay | Admitting: *Deleted

## 2024-03-19 ENCOUNTER — Telehealth: Payer: Self-pay

## 2024-03-19 DIAGNOSIS — E7800 Pure hypercholesterolemia, unspecified: Secondary | ICD-10-CM

## 2024-03-19 DIAGNOSIS — J301 Allergic rhinitis due to pollen: Secondary | ICD-10-CM | POA: Diagnosis not present

## 2024-03-19 NOTE — Telephone Encounter (Signed)
-----   Message from Veva JINNY Ferrari sent at 03/19/2024  2:43 PM EDT ----- Regarding: Lab orders for Tue, 10.14.25 Patient is scheduled for CPX labs, please order future labs, Thanks , Veva

## 2024-03-19 NOTE — Telephone Encounter (Signed)
 Copied from CRM [id number]. Topic: Clinical - Request for Lab/Test Order >> Mar 19, 2024 10:41 AM Joyce Brock wrote: Reason for CRM: Patient would like to get bloodwork completed before physical   ----------------------------------------------------------------------- From previous Reason for Contact - Lab/Test Results: Reason for CRM:

## 2024-03-25 ENCOUNTER — Other Ambulatory Visit (INDEPENDENT_AMBULATORY_CARE_PROVIDER_SITE_OTHER)

## 2024-03-25 ENCOUNTER — Ambulatory Visit: Payer: Self-pay | Admitting: Family Medicine

## 2024-03-25 DIAGNOSIS — E7800 Pure hypercholesterolemia, unspecified: Secondary | ICD-10-CM | POA: Diagnosis not present

## 2024-03-25 LAB — LIPID PANEL
Cholesterol: 161 mg/dL (ref 0–200)
HDL: 52.3 mg/dL (ref >39.00)
LDL Cholesterol: 81 mg/dL (ref 0–99)
NonHDL: 108.77
Total CHOL/HDL Ratio: 3
Triglycerides: 141 mg/dL (ref 0.0–149.0)
VLDL: 28.2 mg/dL (ref 0.0–40.0)

## 2024-03-25 LAB — COMPREHENSIVE METABOLIC PANEL WITH GFR
ALT: 13 U/L (ref 0–35)
AST: 14 U/L (ref 0–37)
Albumin: 4.3 g/dL (ref 3.5–5.2)
Alkaline Phosphatase: 68 U/L (ref 39–117)
BUN: 9 mg/dL (ref 6–23)
CO2: 29 meq/L (ref 19–32)
Calcium: 9 mg/dL (ref 8.4–10.5)
Chloride: 104 meq/L (ref 96–112)
Creatinine, Ser: 0.92 mg/dL (ref 0.40–1.20)
GFR: 68.32 mL/min (ref >60.00)
Glucose, Bld: 101 mg/dL — ABNORMAL HIGH (ref 70–99)
Potassium: 3.8 meq/L (ref 3.5–5.1)
Sodium: 140 meq/L (ref 135–145)
Total Bilirubin: 0.5 mg/dL (ref 0.2–1.2)
Total Protein: 7.1 g/dL (ref 6.0–8.3)

## 2024-03-25 NOTE — Progress Notes (Signed)
 No critical labs need to be addressed urgently. We will discuss labs in detail at upcoming office visit.

## 2024-03-28 ENCOUNTER — Encounter: Admitting: Family Medicine

## 2024-04-01 ENCOUNTER — Encounter: Admitting: Family Medicine

## 2024-04-02 DIAGNOSIS — J301 Allergic rhinitis due to pollen: Secondary | ICD-10-CM | POA: Diagnosis not present

## 2024-04-03 ENCOUNTER — Encounter: Payer: Self-pay | Admitting: Family Medicine

## 2024-04-03 ENCOUNTER — Ambulatory Visit: Admitting: Family Medicine

## 2024-04-03 VITALS — BP 112/84 | HR 104 | Temp 99.5°F | Ht 66.0 in | Wt 167.1 lb

## 2024-04-03 DIAGNOSIS — Z0000 Encounter for general adult medical examination without abnormal findings: Secondary | ICD-10-CM | POA: Diagnosis not present

## 2024-04-03 DIAGNOSIS — R000 Tachycardia, unspecified: Secondary | ICD-10-CM | POA: Diagnosis not present

## 2024-04-03 DIAGNOSIS — E7800 Pure hypercholesterolemia, unspecified: Secondary | ICD-10-CM | POA: Diagnosis not present

## 2024-04-03 DIAGNOSIS — F411 Generalized anxiety disorder: Secondary | ICD-10-CM

## 2024-04-03 DIAGNOSIS — R7309 Other abnormal glucose: Secondary | ICD-10-CM | POA: Insufficient documentation

## 2024-04-03 LAB — POCT GLYCOSYLATED HEMOGLOBIN (HGB A1C): Hemoglobin A1C: 5.5 % (ref 4.0–5.6)

## 2024-04-03 NOTE — Assessment & Plan Note (Signed)
 A1c in office today was 5.5, normal.

## 2024-04-03 NOTE — Assessment & Plan Note (Signed)
Chronic, evaluation completed by cardiology.  Well-managed with Toprol-XL 25 mg p.o. daily.  Related to GAD

## 2024-04-03 NOTE — Assessment & Plan Note (Signed)
Chronic, recurrent, well controlled   Paxil 10 mg daily

## 2024-04-03 NOTE — Progress Notes (Signed)
 Patient ID: Joyce Brock, female    DOB: 10-Oct-1965, 58 y.o.   MRN: [id number]  This visit was conducted in person.  BP 112/84   Pulse (!) 104   Temp 99.5 F (37.5 C) (Temporal)   Ht 5' 6 (1.676 m)   Wt 167 lb 2 oz (75.8 kg)   LMP 03/23/2013   SpO2 94%   BMI 26.97 kg/m    CC:  Chief Complaint  Patient presents with   Annual Exam    Subjective:   HPI: Joyce Brock is a 58 y.o. female presenting on 04/03/2024 for Annual Exam The patient presents for complete physical and review of chronic health problems. He/She also has the following acute concerns today: none   Feeling well overall.  GAD , significant improvement with paxil  and therapy     03/22/2023    8:29 AM 05/11/2022   11:33 AM 03/10/2021   10:30 AM 10/18/2018   10:18 AM  GAD 7 : Generalized Anxiety Score  Nervous, Anxious, on Edge 0 3 1 1   Control/stop worrying 0 3 0 1  Worry too much - different things 0 2 0 1  Trouble relaxing 0 2 1 1   Restless 0 0 1 1  Easily annoyed or irritable 0 1 1 0  Afraid - awful might happen 0 3 0 0  Total GAD 7 Score 0 14 4 5   Anxiety Difficulty Not difficult at all Somewhat difficult Not difficult at all      [facility] Visit from 04/03/2024 in [facility]  PHQ-2 Total Score 0    Diet: heart healthy diet Exercise: minimal exercise Wt Readings from Last 3 Encounters:  04/03/24 167 lb 2 oz (75.8 kg)  11/15/23 171 lb 6 oz (77.7 kg)  07/23/23 168 lb 12.8 oz (76.6 kg)    Elevated Cholesterol:  On atorvastatin  10 mg  every other day ( had SE at daily dose), and zetia   Lab Results  Component Value Date   CHOL 161 03/25/2024   HDL 52.30 03/25/2024   LDLCALC 81 03/25/2024   LDLDIRECT 168.7 03/22/2012   TRIG 141.0 03/25/2024   CHOLHDL 3 03/25/2024  The 10-year ASCVD risk score (Arnett DK, et al., 2019) is: 2%   Values used to calculate the score:     Age: 80 years     Clincally relevant sex: Female     Is  Non-Hispanic African American: No     Diabetic: No     Tobacco smoker: No     Systolic Blood Pressure: 112 mmHg     Is BP treated: No     HDL Cholesterol: 52.3 mg/dL     Total Cholesterol: 161 mg/dL Using medications without problems: none Muscle aches:  none Diet compliance: heart healthy diet, less eating out Exercise:   going to PT for her arm, plans to restart. Other complaints:   Palpitations:  No longer on metoprolol  pre cardiology.. no spells of rapid heart rate in last several months.  Very closely related to anxiety.,,  improved with anxiety.      Relevant past medical, surgical, family and social history reviewed and updated as indicated. Interim medical history since our last visit reviewed. Allergies and medications reviewed and updated. Outpatient Medications Prior to Visit  Medication Sig Dispense Refill   acetaminophen  (TYLENOL ) 500 MG tablet Take 1,000 mg by mouth every 6 (six) hours as needed.     atorvastatin  (LIPITOR) 10 MG tablet  Take 1 tablet (10 mg total) by mouth every other day. 45 tablet 3   CALCIUM -VITAMIN D  PO Take 1 tablet by mouth daily at 6 (six) AM.     cetirizine  (ZYRTEC ) 10 MG tablet Take 10 mg by mouth daily.     EPINEPHrine  (EPIPEN  2-PAK) 0.3 mg/0.3 mL IJ SOAJ injection Inject 0.3 mg into the muscle as directed. 2 each 10   ezetimibe  (ZETIA ) 10 MG tablet Take 1 tablet (10 mg total) by mouth daily. 90 tablet 2   fluticasone  (FLONASE ) 50 MCG/ACT nasal spray Place 2 sprays into both nostrils daily. 16 g 11   Glucosamine HCl (GLUCOSAMINE PO) Take 2 tablets by mouth daily.     PARoxetine  (PAXIL ) 10 MG tablet Take 1 tablet (10 mg total) by mouth daily. 90 tablet 1   pimecrolimus  (ELIDEL ) 1 % cream Apply twice daily to eyelids at first sign of symptoms. Use until clear. 30 g 3   Probiotic Product (ALIGN PO) Take 1 tablet by mouth daily.     triamcinolone  cream (KENALOG ) 0.1 % Apply 1 Application topically 2 (two) times daily. 15 g 0   vitamin B-12  (CYANOCOBALAMIN ) 1000 MCG tablet Take 1 tablet (1,000 mcg total) by mouth daily. 90 tablet 0   metoprolol  succinate (TOPROL  XL) 25 MG 24 hr tablet Take 1 tablet (25 mg total) by mouth daily. 90 tablet 3   No facility-administered medications prior to visit.     Per HPI unless specifically indicated in ROS section below Review of Systems  Constitutional:  Negative for fatigue and fever.  HENT:  Negative for congestion.   Eyes:  Negative for pain.  Respiratory:  Negative for cough and shortness of breath.   Cardiovascular:  Negative for chest pain, palpitations and leg swelling.  Gastrointestinal:  Negative for abdominal pain.  Genitourinary:  Negative for dysuria and vaginal bleeding.  Musculoskeletal:  Negative for back pain.  Neurological:  Negative for syncope, light-headedness and headaches.  Psychiatric/Behavioral:  Negative for dysphoric mood.    Objective:  BP 112/84   Pulse (!) 104   Temp 99.5 F (37.5 C) (Temporal)   Ht 5' 6 (1.676 m)   Wt 167 lb 2 oz (75.8 kg)   LMP 03/23/2013   SpO2 94%   BMI 26.97 kg/m   Wt Readings from Last 3 Encounters:  04/03/24 167 lb 2 oz (75.8 kg)  11/15/23 171 lb 6 oz (77.7 kg)  07/23/23 168 lb 12.8 oz (76.6 kg)      Physical Exam Vitals and nursing note reviewed.  Constitutional:      General: She is not in acute distress.    Appearance: Normal appearance. She is well-developed. She is not ill-appearing or toxic-appearing.  HENT:     Head: Normocephalic.     Right Ear: Hearing, tympanic membrane, ear canal and external ear normal.     Left Ear: Hearing, tympanic membrane, ear canal and external ear normal.     Nose: Nose normal.  Eyes:     General: Lids are normal. Lids are everted, no foreign bodies appreciated.     Conjunctiva/sclera: Conjunctivae normal.     Pupils: Pupils are equal, round, and reactive to light.  Neck:     Thyroid : No thyroid  mass or thyromegaly.     Vascular: No carotid bruit.     Trachea: Trachea normal.   Cardiovascular:     Rate and Rhythm: Normal rate and regular rhythm.     Heart sounds: Normal heart sounds, S1 normal  and S2 normal. No murmur heard.    No gallop.  Pulmonary:     Effort: Pulmonary effort is normal. No respiratory distress.     Breath sounds: Normal breath sounds. No wheezing, rhonchi or rales.  Abdominal:     General: Bowel sounds are normal. There is no distension or abdominal bruit.     Palpations: Abdomen is soft. There is no fluid wave or mass.     Tenderness: There is no abdominal tenderness. There is no guarding or rebound.     Hernia: No hernia is present.  Musculoskeletal:     Cervical back: Normal range of motion and neck supple.  Lymphadenopathy:     Cervical: No cervical adenopathy.  Skin:    General: Skin is warm and dry.     Findings: No rash.  Neurological:     Mental Status: She is alert.     Cranial Nerves: No cranial nerve deficit.     Sensory: No sensory deficit.  Psychiatric:        Mood and Affect: Mood is not anxious or depressed.        Speech: Speech normal.        Behavior: Behavior normal. Behavior is cooperative.        Judgment: Judgment normal.       Results for orders placed or performed in visit on 04/03/24  POCT glycosylated hemoglobin (Hb A1C)   Collection Time: 04/03/24  4:12 PM  Result Value Ref Range   Hemoglobin A1C 5.5 4.0 - 5.6 %   HbA1c POC (<> result, manual entry)     HbA1c, POC (prediabetic range)     HbA1c, POC (controlled diabetic range)       COVID 19 screen:  No recent travel or known exposure to COVID19 The patient denies respiratory symptoms of COVID 19 at this time. The importance of social distancing was discussed today.   Assessment and Plan   The patient's preventative maintenance and recommended screening tests for an annual wellness exam were reviewed in full today. Brought up to date unless services declined.  Counselled on the importance of diet, exercise, and its role in overall health and  mortality. The patient's FH and SH was reviewed, including their home life, tobacco status, and drug and alcohol status.   Vaccines: Uptodate COVID series x 3 ,  Tdap, consider  PNA, covid booster, hep B, shingrix,  influenza.  Mammo: 04/2023 nml, sister with breast cancer. BRCA1 and BRCA 2 neg.   Smoking:no  STD screen/ HIV : refused.  PAP/DVE: 02/2020 normal with neg HPV Colon: colonoscopy 04/2023 repeat in 7 years.  Hep C:  neg  ETOH: none  Problem List Items Addressed This Visit     Elevated glucose level   A1c in office today was 5.5, normal.      Relevant Orders   POCT glycosylated hemoglobin (Hb A1C) (Completed)   GAD (generalized anxiety disorder)   Chronic, recurrent, well controlled   Paxil  10 mg daily      HYPERCHOLESTEROLEMIA   Chronic, well controlled with LDL at goal < 100 on atorvastatin  10 mg  every other daily.  10-year ASCVD risk score is 2%  atorvastatin  10 mg daily Zetia  10 mg daily      Tachycardia   Chronic, evaluation completed by cardiology.  Well-managed with Toprol -XL 25 mg p.o. daily.  Related to GAD      Other Visit Diagnoses       Routine general medical examination at a  health care facility    -  Primary        Greig Ring, MD

## 2024-04-03 NOTE — Assessment & Plan Note (Signed)
 Chronic, well controlled with LDL at goal < 100 on atorvastatin  10 mg  every other daily.  10-year ASCVD risk score is 2%  atorvastatin  10 mg daily Zetia  10 mg daily

## 2024-04-04 DIAGNOSIS — H43812 Vitreous degeneration, left eye: Secondary | ICD-10-CM | POA: Diagnosis not present

## 2024-04-07 ENCOUNTER — Encounter: Payer: Self-pay | Admitting: Family Medicine

## 2024-04-07 ENCOUNTER — Other Ambulatory Visit: Payer: Self-pay

## 2024-04-07 MED ORDER — FLUZONE 0.5 ML IM SUSY
0.5000 mL | PREFILLED_SYRINGE | Freq: Once | INTRAMUSCULAR | 0 refills | Status: AC
  Filled 2024-04-07: qty 0.5, 1d supply, fill #0

## 2024-04-09 DIAGNOSIS — J301 Allergic rhinitis due to pollen: Secondary | ICD-10-CM | POA: Diagnosis not present

## 2024-04-10 DIAGNOSIS — J309 Allergic rhinitis, unspecified: Secondary | ICD-10-CM | POA: Diagnosis not present

## 2024-04-14 ENCOUNTER — Encounter: Payer: Self-pay | Admitting: Radiology

## 2024-04-23 DIAGNOSIS — J301 Allergic rhinitis due to pollen: Secondary | ICD-10-CM | POA: Diagnosis not present

## 2024-04-28 ENCOUNTER — Other Ambulatory Visit
Admission: RE | Admit: 2024-04-28 | Discharge: 2024-04-28 | Disposition: A | Discharge status: Home / Self Care | Source: Ambulatory Visit | Attending: Rheumatology | Admitting: Rheumatology

## 2024-04-28 DIAGNOSIS — M1380 Other specified arthritis, unspecified site: Secondary | ICD-10-CM | POA: Diagnosis not present

## 2024-04-28 DIAGNOSIS — L4050 Arthropathic psoriasis, unspecified: Secondary | ICD-10-CM | POA: Diagnosis not present

## 2024-04-28 DIAGNOSIS — L409 Psoriasis, unspecified: Secondary | ICD-10-CM | POA: Diagnosis not present

## 2024-04-28 DIAGNOSIS — M25461 Effusion, right knee: Secondary | ICD-10-CM | POA: Insufficient documentation

## 2024-04-28 DIAGNOSIS — Z7960 Long term (current) use of unspecified immunomodulators and immunosuppressants: Secondary | ICD-10-CM | POA: Diagnosis not present

## 2024-04-28 DIAGNOSIS — Z111 Encounter for screening for respiratory tuberculosis: Secondary | ICD-10-CM | POA: Diagnosis not present

## 2024-04-28 LAB — SYNOVIAL CELL COUNT + DIFF, W/ CRYSTALS
Eosinophils-Synovial: 0 % (ref 0–1)
Lymphocytes-Synovial Fld: 37 % — ABNORMAL HIGH (ref 0–20)
Monocyte-Macrophage-Synovial Fluid: 55 % (ref 50–90)
Neutrophil, Synovial: 8 % (ref 0–25)
WBC, Synovial: 169 /mm3 (ref 0–200)

## 2024-04-30 DIAGNOSIS — J301 Allergic rhinitis due to pollen: Secondary | ICD-10-CM | POA: Diagnosis not present

## 2024-05-01 LAB — BODY FLUID CULTURE W GRAM STAIN
Culture: NO GROWTH
Gram Stain: NONE SEEN

## 2024-05-14 DIAGNOSIS — J301 Allergic rhinitis due to pollen: Secondary | ICD-10-CM | POA: Diagnosis not present

## 2024-05-14 DIAGNOSIS — Z1231 Encounter for screening mammogram for malignant neoplasm of breast: Secondary | ICD-10-CM | POA: Diagnosis not present

## 2024-05-14 LAB — HM MAMMOGRAPHY

## 2024-05-15 ENCOUNTER — Encounter: Payer: Self-pay | Admitting: Family Medicine

## 2024-05-15 ENCOUNTER — Ambulatory Visit: Payer: Self-pay | Admitting: Family Medicine

## 2024-05-21 DIAGNOSIS — J301 Allergic rhinitis due to pollen: Secondary | ICD-10-CM | POA: Diagnosis not present

## 2024-05-27 ENCOUNTER — Other Ambulatory Visit: Payer: Self-pay | Admitting: Family Medicine

## 2024-05-27 ENCOUNTER — Other Ambulatory Visit: Payer: Self-pay

## 2024-05-27 MED ORDER — ATORVASTATIN CALCIUM 10 MG PO TABS
10.0000 mg | ORAL_TABLET | ORAL | 3 refills | Status: AC
  Filled 2024-05-27: qty 45, 90d supply, fill #0

## 2024-05-27 MED ORDER — PAROXETINE HCL 10 MG PO TABS
10.0000 mg | ORAL_TABLET | Freq: Every day | ORAL | 1 refills | Status: AC
  Filled 2024-05-27: qty 90, 90d supply, fill #0

## 2024-05-27 MED FILL — Ezetimibe Tab 10 MG: ORAL | 90 days supply | Qty: 90 | Fill #2 | Status: AC

## 2024-05-28 ENCOUNTER — Other Ambulatory Visit: Payer: Self-pay

## 2024-05-28 DIAGNOSIS — J301 Allergic rhinitis due to pollen: Secondary | ICD-10-CM | POA: Diagnosis not present

## 2024-06-04 DIAGNOSIS — J301 Allergic rhinitis due to pollen: Secondary | ICD-10-CM | POA: Diagnosis not present
